# Patient Record
Sex: Female | Born: 1955 | Race: White | Hispanic: No | Smoking: Former smoker
Health system: Southern US, Community
[De-identification: ages and names within clinical notes are randomized; demographics above are authoritative.]

## PROBLEM LIST (undated history)

## (undated) DIAGNOSIS — S069X9A Unspecified intracranial injury with loss of consciousness of unspecified duration, initial encounter: Secondary | ICD-10-CM

## (undated) DIAGNOSIS — S069XAA Unspecified intracranial injury with loss of consciousness status unknown, initial encounter: Secondary | ICD-10-CM

## (undated) DIAGNOSIS — R7303 Prediabetes: Secondary | ICD-10-CM

## (undated) DIAGNOSIS — J189 Pneumonia, unspecified organism: Secondary | ICD-10-CM

## (undated) DIAGNOSIS — R011 Cardiac murmur, unspecified: Secondary | ICD-10-CM

## (undated) DIAGNOSIS — B009 Herpesviral infection, unspecified: Secondary | ICD-10-CM

## (undated) DIAGNOSIS — I10 Essential (primary) hypertension: Secondary | ICD-10-CM

## (undated) DIAGNOSIS — E039 Hypothyroidism, unspecified: Secondary | ICD-10-CM

## (undated) DIAGNOSIS — G4700 Insomnia, unspecified: Secondary | ICD-10-CM

## (undated) DIAGNOSIS — G894 Chronic pain syndrome: Secondary | ICD-10-CM

## (undated) DIAGNOSIS — M549 Dorsalgia, unspecified: Secondary | ICD-10-CM

## (undated) DIAGNOSIS — M1990 Unspecified osteoarthritis, unspecified site: Secondary | ICD-10-CM

## (undated) DIAGNOSIS — I2510 Atherosclerotic heart disease of native coronary artery without angina pectoris: Secondary | ICD-10-CM

## (undated) DIAGNOSIS — Z90710 Acquired absence of both cervix and uterus: Secondary | ICD-10-CM

## (undated) DIAGNOSIS — I341 Nonrheumatic mitral (valve) prolapse: Secondary | ICD-10-CM

## (undated) DIAGNOSIS — C9590 Leukemia, unspecified not having achieved remission: Secondary | ICD-10-CM

## (undated) DIAGNOSIS — G40909 Epilepsy, unspecified, not intractable, without status epilepticus: Secondary | ICD-10-CM

## (undated) HISTORY — PX: TONSILLECTOMY: SUR1361

## (undated) HISTORY — PX: BLADDER SURGERY: SHX569

## (undated) HISTORY — PX: GALLBLADDER SURGERY: SHX652

## (undated) HISTORY — DX: Acquired absence of both cervix and uterus: Z90.710

## (undated) HISTORY — DX: Hypothyroidism, unspecified: E03.9

## (undated) HISTORY — DX: Unspecified osteoarthritis, unspecified site: M19.90

## (undated) HISTORY — PX: TUBAL LIGATION: SHX77

## (undated) HISTORY — DX: Herpesviral infection, unspecified: B00.9

## (undated) HISTORY — DX: Prediabetes: R73.03

## (undated) HISTORY — DX: Essential (primary) hypertension: I10

## (undated) HISTORY — DX: Dorsalgia, unspecified: M54.9

## (undated) HISTORY — PX: ROTATOR CUFF REPAIR: SHX139

## (undated) HISTORY — PX: CARDIAC CATHETERIZATION: SHX172

## (undated) HISTORY — DX: Leukemia, unspecified not having achieved remission: C95.90

## (undated) HISTORY — PX: LUNG REMOVAL, PARTIAL: SHX233

## (undated) HISTORY — DX: Cardiac murmur, unspecified: R01.1

## (undated) HISTORY — PX: ABDOMINAL HYSTERECTOMY: SHX81

## (undated) HISTORY — DX: Insomnia, unspecified: G47.00

---
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
## 2002-05-11 ENCOUNTER — Encounter: Payer: Self-pay | Admitting: Emergency Medicine

## 2002-05-11 ENCOUNTER — Encounter: Payer: Self-pay | Admitting: *Deleted

## 2002-05-11 ENCOUNTER — Emergency Department (HOSPITAL_COMMUNITY): Admission: EM | Admit: 2002-05-11 | Discharge: 2002-05-11 | Payer: Self-pay | Admitting: *Deleted

## 2002-12-11 ENCOUNTER — Emergency Department (HOSPITAL_COMMUNITY): Admission: EM | Admit: 2002-12-11 | Discharge: 2002-12-11 | Payer: Self-pay | Admitting: Emergency Medicine

## 2002-12-16 ENCOUNTER — Encounter: Payer: Self-pay | Admitting: Internal Medicine

## 2002-12-16 ENCOUNTER — Encounter: Admission: RE | Admit: 2002-12-16 | Discharge: 2002-12-16 | Payer: Self-pay | Admitting: Internal Medicine

## 2002-12-22 ENCOUNTER — Emergency Department (HOSPITAL_COMMUNITY): Admission: EM | Admit: 2002-12-22 | Discharge: 2002-12-22 | Payer: Self-pay | Admitting: Emergency Medicine

## 2003-08-01 ENCOUNTER — Encounter: Admission: RE | Admit: 2003-08-01 | Discharge: 2003-08-01 | Payer: Self-pay | Admitting: Internal Medicine

## 2003-08-14 ENCOUNTER — Emergency Department (HOSPITAL_COMMUNITY): Admission: EM | Admit: 2003-08-14 | Discharge: 2003-08-14 | Payer: Self-pay | Admitting: Emergency Medicine

## 2017-11-21 ENCOUNTER — Encounter: Payer: Self-pay | Admitting: Family Medicine

## 2017-11-21 ENCOUNTER — Ambulatory Visit (INDEPENDENT_AMBULATORY_CARE_PROVIDER_SITE_OTHER): Admitting: Family Medicine

## 2017-11-21 ENCOUNTER — Other Ambulatory Visit: Payer: Self-pay

## 2017-11-21 VITALS — BP 150/84 | HR 84 | Temp 97.8°F | Ht 68.0 in | Wt 227.4 lb

## 2017-11-21 DIAGNOSIS — Z1231 Encounter for screening mammogram for malignant neoplasm of breast: Secondary | ICD-10-CM | POA: Diagnosis not present

## 2017-11-21 DIAGNOSIS — M549 Dorsalgia, unspecified: Secondary | ICD-10-CM

## 2017-11-21 DIAGNOSIS — I10 Essential (primary) hypertension: Secondary | ICD-10-CM | POA: Insufficient documentation

## 2017-11-21 DIAGNOSIS — E039 Hypothyroidism, unspecified: Secondary | ICD-10-CM | POA: Diagnosis not present

## 2017-11-21 DIAGNOSIS — Z79890 Hormone replacement therapy (postmenopausal): Secondary | ICD-10-CM | POA: Diagnosis not present

## 2017-11-21 DIAGNOSIS — G4700 Insomnia, unspecified: Secondary | ICD-10-CM | POA: Diagnosis not present

## 2017-11-21 DIAGNOSIS — M5430 Sciatica, unspecified side: Secondary | ICD-10-CM

## 2017-11-21 DIAGNOSIS — R7303 Prediabetes: Secondary | ICD-10-CM | POA: Diagnosis not present

## 2017-11-21 MED ORDER — METHOCARBAMOL 750 MG PO TABS: 750.0000 mg | ORAL_TABLET | Freq: Four times a day (QID) | ORAL | 5 refills | Status: DC | PRN

## 2017-11-21 MED ORDER — CELECOXIB 200 MG PO CAPS: 200.0000 mg | ORAL_CAPSULE | Freq: Two times a day (BID) | ORAL | 5 refills | Status: DC

## 2017-11-21 MED ORDER — HYDROCHLOROTHIAZIDE 25 MG PO TABS
25.0000 mg | ORAL_TABLET | Freq: Every day | ORAL | 1 refills | Status: DC
Start: 2017-11-21 — End: 2018-06-01

## 2017-11-21 MED ORDER — LEVOTHYROXINE SODIUM 125 MCG PO TABS: 125.0000 ug | ORAL_TABLET | Freq: Every day | ORAL | 2 refills | Status: DC

## 2017-11-21 MED ORDER — TRAZODONE HCL 100 MG PO TABS: 100.0000 mg | ORAL_TABLET | Freq: Every day | ORAL | 1 refills | Status: DC

## 2017-11-21 NOTE — Patient Instructions (Signed)
     IF you received an x-ray today, you will receive an invoice from [facility]. Please contact [facility] at [phone] with questions or concerns regarding your invoice.   IF you received labwork today, you will receive an invoice from [facility]. Please contact [facility] at [phone] with questions or concerns regarding your invoice.   Our billing staff will not be able to assist you with questions regarding bills from these companies.  You will be contacted with the lab results as soon as they are available. The fastest way to get your results is to activate your My Chart account. Instructions are located on the last page of this paperwork. If you have not heard from us regarding the results in 2 weeks, please contact this office.     

## 2017-11-21 NOTE — Progress Notes (Signed)
3/8/20192:40 PM  Natalie Eaton 1955/10/01, 62 y.o. female [id number]  Chief Complaint  Patient presents with  . Establish Care    Just dmoveh here from [place]. Looking to establish care. Known to have thyroid condition. On medication or that.    HPI:   Patient is a 62 y.o. female with past medical history significant per medical h/o who presents today to establish care  Recently moved from [place] to [place] to be closer to grandchildren Last medical appt about a year ago Reports normal colonoscopy several years ago Reports benign mammogram a year ago, had 2 small benign cysts in right breast Had hyst in her 29s, unclear reason, remembers being told she had abnormal cells, but no cancer Heart cath done in [place] maybe 5 years ago, per patient normal  Wondering if her thyroid is at goal. Gaining weight, tired In the past has done very well with weight watchers Changed last year from premarin to estrace 1mg , on HRT 20 years, she tried to wean herself off but had horrible hot flashes, we went abruptly from 1mg  to 1/2mg  daily. 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 Codeine     Prior to Admission medications   Medication Sig Start Date End Date Taking? Authorizing Provider  aspirin 81 MG chewable tablet Chew by mouth daily.   Yes [provider]  celecoxib (CELEBREX) 200 MG capsule Take 200 mg by mouth 2 (two) times daily.   Yes [provider]  estradiol (ESTRACE) 1 MG tablet Take 1 mg by mouth daily.   Yes [provider]  gabapentin (NEURONTIN) 300 MG capsule Take 300 mg by mouth 3 (three) times daily.   Yes [provider]  levothyroxine (SYNTHROID, LEVOTHROID) 125  MCG tablet Take 125 mcg by mouth daily before breakfast.   Yes [provider]  methocarbamol (ROBAXIN) 750 MG tablet Take 750 mg by mouth 4 (four) times daily.   Yes [provider]  multivitamin-iron-minerals-folic acid (CENTRUM) chewable tablet Chew 1 tablet by mouth daily.   Yes [provider]  naproxen sodium (ALEVE) 220 MG tablet Take 220 mg by mouth.   Yes [provider]  traZODone (DESYREL) 100 MG tablet Take 100 mg by mouth at bedtime.   Yes [provider]    Past Medical History:  Diagnosis Date  . Back pain   . History of hysterectomy   . Hypertension   . Hypothyroidism   . Insomnia   . Pre-diabetes     Past Surgical History:  Procedure Laterality Date  . ABDOMINAL HYSTERECTOMY    . GALLBLADDER SURGERY    . LUNG REMOVAL, PARTIAL      Social History   Tobacco Use  . Smoking status: Former Research scientist (life sciences)  . Smokeless tobacco: Never Used  Substance Use Topics  . Alcohol use: Yes    Alcohol/week: 0.6 oz    Types: 1 Glasses of wine per week    Frequency: Never    Family History  Problem Relation Age of Onset  . Healthy Mother   . Heart disease Brother   . Healthy Daughter   . Healthy Son     Review of Systems  Constitutional: Negative  for chills and fever.  HENT: Negative for congestion, ear pain and sore throat.   Eyes: Negative for blurred vision and double vision.  Respiratory: Negative for cough and shortness of breath.   Cardiovascular: Negative for chest pain, palpitations and leg swelling.  Gastrointestinal: Negative for abdominal pain, constipation, diarrhea, nausea and vomiting.  Genitourinary: Negative for dysuria and hematuria.       Neg breast lumps or nipple discharge Neg vaginal discharge, pelvic pain  Musculoskeletal: Positive for back pain.  Neurological: Positive for tingling. Negative for dizziness, focal weakness and headaches.  All other systems reviewed and are  negative.    OBJECTIVE:  Blood pressure (!) 150/84, pulse 84, temperature 97.8 F (36.6 C), temperature source Oral, height 5\' 8"  (1.727 m), weight 227 lb 6.4 oz (103.1 kg), SpO2 99 %.  Physical Exam  Constitutional: She is oriented to person, place, and time and well-developed, well-nourished, and in no distress.  HENT:  Head: Normocephalic and atraumatic.  Right Ear: Hearing, tympanic membrane, external ear and ear canal normal.  Left Ear: Hearing, tympanic membrane, external ear and ear canal normal.  Mouth/Throat: Oropharynx is clear and moist.  Eyes: EOM are normal. Pupils are equal, round, and reactive to light.  Neck: Neck supple. No thyromegaly present.  Cardiovascular: Normal rate, regular rhythm, normal heart sounds and intact distal pulses. Exam reveals no gallop and no friction rub.  No murmur heard. Pulmonary/Chest: Effort normal and breath sounds normal. She has no wheezes. She has no rales.  Abdominal: Soft. Bowel sounds are normal. She exhibits no distension and no mass. There is no tenderness.  Musculoskeletal: Normal range of motion. She exhibits no edema.  Lymphadenopathy:    She has no cervical adenopathy.  Neurological: She is alert and oriented to person, place, and time. She has normal reflexes. Gait normal.  Skin: Skin is warm and dry.  Psychiatric: Mood and affect normal.  Nursing note and vitals reviewed.   ASSESSMENT and PLAN  1. Essential hypertension, benign Above goal, but patient wo medications for about a week,  Refilling HCTZ, she takes KCL supplements. Discussed DASH diet, Recheck at next visit. - CBC - Comprehensive metabolic panel - Lipid panel  2. Hypothyroidism, unspecified type Patient seems to be symptomatic, checking level today, will adjust meds if needed - TSH  3. Back pain with sciatica Stable on current regime  4. Prediabetes Patient with recent weight gain, checking a1c today. Discussed low carb diet and regular exercise.  consider rejoining weight watchers. - Hemoglobin A1c - Lipid panel  5. Insomnia, unspecified type Controlled, cont trazdodone  6. Hormone replacement therapy (HRT) Discussed r/se/b. Discussed very slow wean strategy.   7. Visit for screening mammogram - MM DIGITAL SCREENING BILATERAL; Future  Other orders - multivitamin-iron-minerals-folic acid (CENTRUM) chewable tablet; Chew 1 tablet by mouth daily. - naproxen sodium (ALEVE) 220 MG tablet; Take 220 mg by mouth. - aspirin 81 MG chewable tablet; Chew by mouth daily. - estradiol (ESTRACE) 1 MG tablet; Take 1 mg by mouth daily. - gabapentin (NEURONTIN) 300 MG capsule; Take 300 mg by mouth 3 (three) times daily. - potassium chloride SA (K-DUR,KLOR-CON) 20 MEQ tablet; Take 20 mEq by mouth daily. - hydrochlorothiazide (HYDRODIURIL) 25 MG tablet; Take 1 tablet (25 mg total) by mouth daily. - levothyroxine (SYNTHROID, LEVOTHROID) 125 MCG tablet; Take 1 tablet (125 mcg total) by mouth daily before breakfast. - methocarbamol (ROBAXIN) 750 MG tablet; Take 1 tablet (750 mg total) by mouth every 6 (six) hours as needed  for muscle spasms. - traZODone (DESYREL) 100 MG tablet; Take 1 tablet (100 mg total) by mouth at bedtime. - celecoxib (CELEBREX) 200 MG capsule; Take 1 capsule (200 mg total) by mouth 2 (two) times daily.  Return in about 4 weeks (around 12/19/2017).    Rutherford Guys, MD Primary Care at [facility] [place], [place] [address] Ph.  [phone] Fax [phone]

## 2017-11-22 ENCOUNTER — Encounter: Payer: Self-pay | Admitting: Family Medicine

## 2017-11-22 LAB — COMPREHENSIVE METABOLIC PANEL
ALT: 25 IU/L (ref 0–32)
AST: 19 IU/L (ref 0–40)
Albumin/Globulin Ratio: 1.8 (ref 1.2–2.2)
Albumin: 4.3 g/dL (ref 3.6–4.8)
Alkaline Phosphatase: 68 IU/L (ref 39–117)
BUN/Creatinine Ratio: 29 — ABNORMAL HIGH (ref 12–28)
BUN: 22 mg/dL (ref 8–27)
Bilirubin Total: <0.2 mg/dL (ref 0.0–1.2)
CO2: 25 mmol/L (ref 20–29)
Calcium: 9.6 mg/dL (ref 8.7–10.3)
Chloride: 103 mmol/L (ref 96–106)
Creatinine, Ser: 0.77 mg/dL (ref 0.57–1.00)
GFR calc Af Amer: 96 mL/min/{1.73_m2} (ref >59)
GFR calc non Af Amer: 83 mL/min/{1.73_m2} (ref >59)
Globulin, Total: 2.4 g/dL (ref 1.5–4.5)
Glucose: 95 mg/dL (ref 65–99)
Potassium: 4.4 mmol/L (ref 3.5–5.2)
Sodium: 142 mmol/L (ref 134–144)
Total Protein: 6.7 g/dL (ref 6.0–8.5)

## 2017-11-22 LAB — CBC
Hematocrit: 40.9 % (ref 34.0–46.6)
Hemoglobin: 13.2 g/dL (ref 11.1–15.9)
MCH: 31 pg (ref 26.6–33.0)
MCHC: 32.3 g/dL (ref 31.5–35.7)
MCV: 96 fL (ref 79–97)
Platelets: 298 10*3/uL (ref 150–379)
RBC: 4.26 x10E6/uL (ref 3.77–5.28)
RDW: 13.9 % (ref 12.3–15.4)
WBC: 11.5 10*3/uL — ABNORMAL HIGH (ref 3.4–10.8)

## 2017-11-22 LAB — LIPID PANEL
Chol/HDL Ratio: 4.6 ratio — ABNORMAL HIGH (ref 0.0–4.4)
Cholesterol, Total: 210 mg/dL — ABNORMAL HIGH (ref 100–199)
HDL: 46 mg/dL (ref >39)
LDL Calculated: 121 mg/dL — ABNORMAL HIGH (ref 0–99)
Triglycerides: 215 mg/dL — ABNORMAL HIGH (ref 0–149)
VLDL Cholesterol Cal: 43 mg/dL — ABNORMAL HIGH (ref 5–40)

## 2017-11-22 LAB — HEMOGLOBIN A1C
Est. average glucose Bld gHb Est-mCnc: 114 mg/dL
Hgb A1c MFr Bld: 5.6 % (ref 4.8–5.6)

## 2017-11-22 LAB — TSH: TSH: 0.662 u[IU]/mL (ref 0.450–4.500)

## 2017-11-25 ENCOUNTER — Other Ambulatory Visit: Payer: Self-pay

## 2017-11-25 ENCOUNTER — Encounter (HOSPITAL_COMMUNITY): Payer: Self-pay | Admitting: Emergency Medicine

## 2017-11-25 ENCOUNTER — Emergency Department (HOSPITAL_COMMUNITY)
Admission: EM | Admit: 2017-11-25 | Discharge: 2017-11-26 | Disposition: A | Discharge status: Home / Self Care | Payer: Self-pay | Attending: Emergency Medicine | Admitting: Emergency Medicine

## 2017-11-25 DIAGNOSIS — Z5321 Procedure and treatment not carried out due to patient leaving prior to being seen by health care provider: Secondary | ICD-10-CM | POA: Insufficient documentation

## 2017-11-25 DIAGNOSIS — G501 Atypical facial pain: Secondary | ICD-10-CM | POA: Insufficient documentation

## 2017-11-25 NOTE — ED Triage Notes (Signed)
Pt reports that she was working w/ numerous garden chemicals and thinks she might have rubbed her face as it began to burn.  Pt reports she washed her face but it continues to burn.

## 2017-11-26 NOTE — ED Notes (Signed)
11/26/2017, Attempted follow-up call, no answer.

## 2017-12-19 ENCOUNTER — Encounter: Payer: Self-pay | Admitting: Family Medicine

## 2017-12-19 ENCOUNTER — Ambulatory Visit (INDEPENDENT_AMBULATORY_CARE_PROVIDER_SITE_OTHER): Admitting: Family Medicine

## 2017-12-19 ENCOUNTER — Other Ambulatory Visit: Payer: Self-pay

## 2017-12-19 ENCOUNTER — Telehealth: Payer: Self-pay

## 2017-12-19 VITALS — BP 128/62 | HR 83 | Temp 98.1°F | Ht 68.0 in | Wt 217.8 lb

## 2017-12-19 DIAGNOSIS — I10 Essential (primary) hypertension: Secondary | ICD-10-CM

## 2017-12-19 DIAGNOSIS — M549 Dorsalgia, unspecified: Secondary | ICD-10-CM | POA: Diagnosis not present

## 2017-12-19 DIAGNOSIS — E039 Hypothyroidism, unspecified: Secondary | ICD-10-CM | POA: Diagnosis not present

## 2017-12-19 DIAGNOSIS — M5430 Sciatica, unspecified side: Secondary | ICD-10-CM | POA: Diagnosis not present

## 2017-12-19 DIAGNOSIS — Z6833 Body mass index (BMI) 33.0-33.9, adult: Secondary | ICD-10-CM

## 2017-12-19 NOTE — Progress Notes (Signed)
4/5/201910:44 AM  Natalie Eaton [birth year], 62 y.o. female [id number]  Chief Complaint  Patient presents with  . Follow-up    Hypertension and thyroid  . Pain    has pinched nerve in her back, dx in 2012    HPI:   Patient is a 62 y.o. female with past medical history significant for hypothyroidism, HTN, obesity, DDD of spine who presents today for followup on chronic conditions  1. Last TSH at low end of normal, patient continues to struggle with weight loss, started a low carb diet last month, limiting to 60 grams of carb a day, trying to remain active, but this becoming difficult with worsening back pain  2. HTN - takes her BP meds as prescribed, denies any side effects  3. DDD of spine - last evaluation surgery was recommended. She reports low back pain, with radiation down her right hip into her groin and sometimes all the way down to her foot. At times she needs to use a cane. She finds it most comfortable when she is able to use a shopping cart. Pain is constant, today 7/10. She reports stiffness towards the end of the day. In the past she had declined surgery at she lived alone and far from her family but now is considering it as she has more family support. She also is interested in spinal stimulator. Her mother has done well with one.   4. She has also not heard regarding her mammogram appt  Depression screen [facility] 2/9 12/19/2017 11/21/2017  Decreased Interest 0 0  Down, Depressed, Hopeless 0 0  PHQ - 2 Score 0 0    Allergies  Allergen Reactions  . Codeine     Prior to Admission medications   Medication Sig Start Date End Date Taking? Authorizing Provider  aspirin 81 MG chewable tablet Chew by mouth daily.   Yes [provider]  celecoxib (CELEBREX) 200 MG capsule Take 1 capsule (200 mg total) by mouth 2 (two) times daily. 11/21/17  Yes Rutherford Guys, MD  estradiol (ESTRACE) 1 MG tablet Take 1 mg by mouth daily.   Yes [provider]  gabapentin  (NEURONTIN) 300 MG capsule Take 300 mg by mouth 3 (three) times daily.   Yes [provider]  hydrochlorothiazide (HYDRODIURIL) 25 MG tablet Take 1 tablet (25 mg total) by mouth daily. 11/21/17  Yes Rutherford Guys, MD  levothyroxine (SYNTHROID, LEVOTHROID) 125 MCG tablet Take 1 tablet (125 mcg total) by mouth daily before breakfast. 11/21/17  Yes Rutherford Guys, MD  methocarbamol (ROBAXIN) 750 MG tablet Take 1 tablet (750 mg total) by mouth every 6 (six) hours as needed for muscle spasms. 11/21/17  Yes Rutherford Guys, MD  multivitamin-iron-minerals-folic acid (CENTRUM) chewable tablet Chew 1 tablet by mouth daily.   Yes [provider]  potassium chloride SA (K-DUR,KLOR-CON) 20 MEQ tablet Take 20 mEq by mouth daily.   Yes [provider]  traZODone (DESYREL) 100 MG tablet Take 1 tablet (100 mg total) by mouth at bedtime. 11/21/17  Yes Rutherford Guys, MD    Past Medical History:  Diagnosis Date  . Back pain   . History of hysterectomy   . Hypertension   . Hypothyroidism   . Insomnia   . Pre-diabetes     Past Surgical History:  Procedure Laterality Date  . ABDOMINAL HYSTERECTOMY    . GALLBLADDER SURGERY    . LUNG REMOVAL, PARTIAL      Social History  Tobacco Use  . Smoking status: Former Research scientist (life sciences)  . Smokeless tobacco: Never Used  Substance Use Topics  . Alcohol use: Yes    Alcohol/week: 0.6 oz    Types: 1 Glasses of wine per week    Frequency: Never    Family History  Problem Relation Age of Onset  . Healthy Mother   . Heart disease Brother   . Healthy Daughter   . Healthy Son     Review of Systems  Constitutional: Negative for chills and fever.  Respiratory: Negative for cough and shortness of breath.   Cardiovascular: Negative for chest pain, palpitations and leg swelling.  Gastrointestinal: Negative for abdominal pain, nausea and vomiting.     OBJECTIVE:  Blood pressure 128/62, pulse 83, temperature 98.1 F (36.7 C), temperature  source Oral, height 5\' 8"  (1.727 m), weight 217 lb 12.8 oz (98.8 kg), SpO2 96 %.  Wt Readings from Last 3 Encounters:  12/19/17 217 lb 12.8 oz (98.8 kg)  11/25/17 227 lb (103 kg)  11/21/17 227 lb 6.4 oz (103.1 kg)    Physical Exam  Constitutional: She is oriented to person, place, and time and well-developed, well-nourished, and in no distress.  HENT:  Head: Normocephalic and atraumatic.  Mouth/Throat: Mucous membranes are normal.  Eyes: Pupils are equal, round, and reactive to light. EOM are normal. No scleral icterus.  Neck: Neck supple.  Pulmonary/Chest: Effort normal.  Neurological: She is alert and oriented to person, place, and time. Gait normal.  Skin: Skin is warm and dry.  Psychiatric: Mood and affect normal.  Nursing note and vitals reviewed.   ASSESSMENT and PLAN  1. Essential hypertension, benign At goal, continue current regime  2. Hypothyroidism, unspecified type At goal, continue current regime  3. Back pain with sciatica - Ambulatory referral to Spine Surgery  4. BMI 33.0-33.9,adult Congratulated patient on current weight loss, discussed block diets, 1600 calories a day, about 1 pound a week, as she is finding low carb too restrictive. Patient educational handout given.   Provided patient with information to call [facility] to schedule mammogram.  Return for after spine surgeon.    Rutherford Guys, MD Primary Care at [place], [place] [address] Ph.  [phone] Fax [phone]

## 2017-12-19 NOTE — Telephone Encounter (Signed)
Copied from [place]. Topic: Referral - Request >> Dec 19, 2017 11:32 AM Rutherford Nail, NT wrote: Reason for CRM:   Patient calling stated she was seen this morning 4.5.19, and was told that a referral would be put in to a back specialist. Patient states that the back specialist that she is referred to need to be in the [facility]. Please advise.

## 2017-12-19 NOTE — Patient Instructions (Addendum)
[facility], [place], [place] [address] [phone]    IF you received an x-ray today, you will receive an invoice from [facility]. Please contact [facility] at [phone] with questions or concerns regarding your invoice.   IF you received labwork today, you will receive an invoice from [facility]. Please contact [facility] at [phone] with questions or concerns regarding your invoice.   Our billing staff will not be able to assist you with questions regarding bills from these companies.  You will be contacted with the lab results as soon as they are available. The fastest way to get your results is to activate your My Chart account. Instructions are located on the last page of this paperwork. If you have not heard from [place] regarding the results in 2 weeks, please contact this office.

## 2017-12-29 ENCOUNTER — Telehealth: Payer: Self-pay | Admitting: Family Medicine

## 2017-12-29 NOTE — Telephone Encounter (Signed)
Copied from [place] [id number]. Topic: Referral - Status >> Dec 29, 2017  2:10 PM Cleaster Corin, [place] wrote: Reason for CRM:  pt. Calling to check on referral status for mammogram that needs to be done at the  [facility] of [facility]   Pt. Stating that paper work of past imaging hasn't been sent yet.

## 2017-12-30 ENCOUNTER — Other Ambulatory Visit: Payer: Self-pay

## 2017-12-30 DIAGNOSIS — Z1231 Encounter for screening mammogram for malignant neoplasm of breast: Secondary | ICD-10-CM

## 2017-12-30 NOTE — Telephone Encounter (Signed)
Please advise 

## 2017-12-30 NOTE — Telephone Encounter (Signed)
error 

## 2017-12-30 NOTE — Telephone Encounter (Signed)
Spoke with pt and she stated [facility] cannot schedule her mammogram until they receive records from Dr. Edwinna Areola at [facility]. Pt stated she had contacted them to have these records sent but was unsure if they were coming to [place] or going to [place]. I spoke with medical records and we have the records. They are making a copy and placing them in the 102 box for pt to pick up. Pt aware and stated she will pick these up tomorrow am to take to [place].

## 2017-12-31 ENCOUNTER — Telehealth: Payer: Self-pay | Admitting: Family Medicine

## 2017-12-31 NOTE — Telephone Encounter (Signed)
Request for refill of Estradiol(Estrace) 1mg  tab. Previously prescribed by historical provider.   LOV: 11/21/17  [facility]   [address]

## 2017-12-31 NOTE — Telephone Encounter (Signed)
Please advise 

## 2017-12-31 NOTE — Telephone Encounter (Signed)
Copied from [place] [id number]. Topic: Referral - Request >> Dec 30, 2017 11:29 AM Hewitt Shorts wrote: Reason for CRM: pt is requesting to change the referral from [facility] (per patient) and she would like would like to change to dr Bettye Boeck number [phone]  -----------------------------------------------------------------------------------------  Please advise. Pt is requesting to see Dr. Nelva Bush at [facility] instead of Neurosurgery. Is this okay? If so, we will need referral in for Orthoapedics. Thanks!

## 2017-12-31 NOTE — Telephone Encounter (Signed)
Copied from [place]. Topic: Quick Communication - Rx Refill/Question >> Dec 31, 2017 11:13 AM Boyd Kerbs wrote:  Medication:estradiol (ESTRACE) 1 MG tablet   Has the patient contacted their pharmacy? No    (Agent: If no, request that the patient contact the pharmacy for the refill.) Preferred Pharmacy (with phone number or street name):   [place], [place] - [address] [place] [address] [id number] [address] [place] [address] Phone: [phone] Fax: [phone]   Agent: Please be advised that RX refills may take up to 3 business days. We ask that you follow-up with your pharmacy.

## 2018-01-02 ENCOUNTER — Other Ambulatory Visit: Payer: Self-pay | Admitting: Family Medicine

## 2018-01-02 DIAGNOSIS — M549 Dorsalgia, unspecified: Secondary | ICD-10-CM

## 2018-01-02 DIAGNOSIS — M5430 Sciatica, unspecified side: Secondary | ICD-10-CM

## 2018-01-02 MED ORDER — ESTRADIOL 1 MG PO TABS: 1.0000 mg | ORAL_TABLET | Freq: Every day | ORAL | 0 refills | Status: DC

## 2018-01-02 NOTE — Telephone Encounter (Signed)
done

## 2018-01-02 NOTE — Telephone Encounter (Signed)
Referral sent. Thanks.

## 2018-01-27 ENCOUNTER — Ambulatory Visit
Admission: RE | Admit: 2018-01-27 | Discharge: 2018-01-27 | Disposition: A | Discharge status: Home / Self Care | Payer: PRIVATE HEALTH INSURANCE | Source: Ambulatory Visit | Attending: Family Medicine | Admitting: Family Medicine

## 2018-01-27 ENCOUNTER — Encounter: Payer: Self-pay | Admitting: Radiology

## 2018-01-27 DIAGNOSIS — Z1231 Encounter for screening mammogram for malignant neoplasm of breast: Secondary | ICD-10-CM

## 2018-02-24 ENCOUNTER — Ambulatory Visit (INDEPENDENT_AMBULATORY_CARE_PROVIDER_SITE_OTHER): Admitting: Family Medicine

## 2018-02-24 ENCOUNTER — Encounter: Payer: Self-pay | Admitting: Family Medicine

## 2018-02-24 ENCOUNTER — Other Ambulatory Visit: Payer: Self-pay

## 2018-02-24 VITALS — BP 130/84 | HR 77 | Temp 99.2°F | Resp 16 | Ht 68.0 in | Wt 215.4 lb

## 2018-02-24 DIAGNOSIS — Z6832 Body mass index (BMI) 32.0-32.9, adult: Secondary | ICD-10-CM

## 2018-02-24 DIAGNOSIS — Z6830 Body mass index (BMI) 30.0-30.9, adult: Secondary | ICD-10-CM | POA: Insufficient documentation

## 2018-02-24 DIAGNOSIS — Z1211 Encounter for screening for malignant neoplasm of colon: Secondary | ICD-10-CM | POA: Diagnosis not present

## 2018-02-24 DIAGNOSIS — R5383 Other fatigue: Secondary | ICD-10-CM

## 2018-02-24 DIAGNOSIS — I10 Essential (primary) hypertension: Secondary | ICD-10-CM

## 2018-02-24 DIAGNOSIS — E039 Hypothyroidism, unspecified: Secondary | ICD-10-CM | POA: Diagnosis not present

## 2018-02-24 DIAGNOSIS — E669 Obesity, unspecified: Secondary | ICD-10-CM | POA: Insufficient documentation

## 2018-02-24 NOTE — Progress Notes (Signed)
6/11/201910:44 AM  Natalie Eaton August 01, 1956, 62 y.o. female [id number]  Chief Complaint  Patient presents with  . essential hypertension    8 week f/u, still experiencing fatigue    HPI:   Patient is a 62 y.o. female with past medical history significant for HTN, hypothyroidism, back pain with sciatica and obesity who presents today for follow-up  TSH at goal Tolerating BP meds as prescribed Has decided to postpone spine surgeon eval, waiting until she has medicare She continues to struggle with days of feeling drained, too tired to do anything She recognizes that her poor sleep and weight might have something to do with this She also mentions that she used to take a ginseng supplement and has been without for a while and thinks that this is also contributing to current occasional fatigue She reports sleeping with 3 days and that being the main reason for poor sleep She will take 1/2 trazodone when she is having increase worrying and needing to quite the brain down She joined weight watchers a week ago, happy about that decision She remains active, just past week she was cutting tree limbs down with an electric chainsaw and digging a trench along her front yard and lying down 50lbs of gravel She denies snoring, waking up with dry mouth, chest pain, DOE, diaphoresis, palpitations, orthopnea or PND    Fall Risk  02/24/2018 12/19/2017 11/21/2017  Falls in the past year? No No No     Depression screen [facility] 2/9 02/24/2018 12/19/2017 11/21/2017  Decreased Interest 0 0 0  Down, Depressed, Hopeless 0 0 0  PHQ - 2 Score 0 0 0    Allergies  Allergen Reactions  . Codeine     Prior to Admission medications   Medication Sig Start Date End Date Taking? Authorizing Provider  aspirin 81 MG chewable tablet Chew by mouth daily.   Yes [provider]  celecoxib (CELEBREX) 200 MG capsule Take 1 capsule (200 mg total) by mouth 2 (two) times daily. 11/21/17  Yes Rutherford Guys, MD    estradiol (ESTRACE) 1 MG tablet Take 1 tablet (1 mg total) by mouth daily. 01/02/18  Yes Rutherford Guys, MD  gabapentin (NEURONTIN) 300 MG capsule Take 300 mg by mouth 3 (three) times daily.   Yes [provider]  hydrochlorothiazide (HYDRODIURIL) 25 MG tablet Take 1 tablet (25 mg total) by mouth daily. 11/21/17  Yes Rutherford Guys, MD  levothyroxine (SYNTHROID, LEVOTHROID) 125 MCG tablet Take 1 tablet (125 mcg total) by mouth daily before breakfast. 11/21/17  Yes Rutherford Guys, MD  methocarbamol (ROBAXIN) 750 MG tablet Take 1 tablet (750 mg total) by mouth every 6 (six) hours as needed for muscle spasms. 11/21/17  Yes Rutherford Guys, MD  multivitamin-iron-minerals-folic acid (CENTRUM) chewable tablet Chew 1 tablet by mouth daily.   Yes [provider]  potassium chloride SA (K-DUR,KLOR-CON) 20 MEQ tablet Take 20 mEq by mouth daily.   Yes [provider]  traZODone (DESYREL) 100 MG tablet Take 1 tablet (100 mg total) by mouth at bedtime. 11/21/17  Yes Rutherford Guys, MD    Past Medical History:  Diagnosis Date  . Back pain   . History of hysterectomy   . Hypertension   . Hypothyroidism   . Insomnia   . Pre-diabetes     Past Surgical History:  Procedure Laterality Date  . ABDOMINAL HYSTERECTOMY    . GALLBLADDER SURGERY    . LUNG REMOVAL, PARTIAL  Social History   Tobacco Use  . Smoking status: Former Research scientist (life sciences)  . Smokeless tobacco: Never Used  Substance Use Topics  . Alcohol use: Yes    Alcohol/week: 0.6 oz    Types: 1 Glasses of wine per week    Frequency: Never    Family History  Problem Relation Age of Onset  . Healthy Mother   . Heart disease Brother   . Healthy Daughter   . Healthy Son     ROS Per hpi  OBJECTIVE:  Blood pressure 130/84, pulse 77, temperature 99.2 F (37.3 C), temperature source Oral, resp. rate 16, height 5\' 8"  (1.727 m), weight 215 lb 6.4 oz (97.7 kg), SpO2 97 %.  Wt Readings from Last 3 Encounters:   02/24/18 215 lb 6.4 oz (97.7 kg)  12/19/17 217 lb 12.8 oz (98.8 kg)  11/25/17 227 lb (103 kg)    Physical Exam  Constitutional: She is oriented to person, place, and time. She appears well-developed and well-nourished.  HENT:  Head: Normocephalic and atraumatic.  Mouth/Throat: Oropharynx is clear and moist. No oropharyngeal exudate.  Eyes: Pupils are equal, round, and reactive to light. EOM are normal. No scleral icterus.  Neck: Neck supple.  Cardiovascular: Normal rate, regular rhythm and normal heart sounds. Exam reveals no gallop and no friction rub.  No murmur heard. Pulmonary/Chest: Effort normal and breath sounds normal. She has no wheezes. She has no rales.  Musculoskeletal: She exhibits no edema.  Neurological: She is alert and oriented to person, place, and time.  Skin: Skin is warm and dry.  Psychiatric: She has a normal mood and affect.  Nursing note and vitals reviewed.    ASSESSMENT and PLAN  1. Essential hypertension, benign At goal, cont current regime  2. Hypothyroidism, unspecified type At goal, cont current dose  3. Fatigue, unspecified type Labs unremarkable, patient with no cardiac sx and able to perform strenuous activities without issues. Reports normal cath 5 years ago. Discussed improving sleep, continue with weight loss. Will continue to monitor. RTC precautions reviewed  4. BMI 32.0-32.9,adult Continues to slowly loss weight. Cont weight watchers.  5. Screening for colon cancer - Ambulatory referral to Gastroenterology  Return in about 6 months (around 08/26/2018).    Rutherford Guys, MD Primary Care at [facility] [place], [place] [address] Ph.  [phone] Fax [phone]

## 2018-02-24 NOTE — Patient Instructions (Signed)
     IF you received an x-ray today, you will receive an invoice from [facility]. Please contact  Radiology at [phone] with questions or concerns regarding your invoice.   IF you received labwork today, you will receive an invoice from [facility]. Please contact [facility] at [phone] with questions or concerns regarding your invoice.   Our billing staff will not be able to assist you with questions regarding bills from these companies.  You will be contacted with the lab results as soon as they are available. The fastest way to get your results is to activate your My Chart account. Instructions are located on the last page of this paperwork. If you have not heard from us regarding the results in 2 weeks, please contact this office.     

## 2018-02-25 ENCOUNTER — Encounter: Payer: Self-pay | Admitting: Family Medicine

## 2018-03-11 ENCOUNTER — Other Ambulatory Visit: Payer: Self-pay | Admitting: Family Medicine

## 2018-03-23 ENCOUNTER — Telehealth: Payer: Self-pay

## 2018-03-23 DIAGNOSIS — B009 Herpesviral infection, unspecified: Secondary | ICD-10-CM

## 2018-03-23 HISTORY — DX: Herpesviral infection, unspecified: B00.9

## 2018-03-23 MED ORDER — ESTRADIOL 1 MG PO TABS: ORAL_TABLET | ORAL | 0 refills | Status: DC

## 2018-03-23 NOTE — Telephone Encounter (Signed)
Spoke with patient. Has not tried weaning. Discussed starting efforts again. New rx with weaning instructions given.

## 2018-03-23 NOTE — Telephone Encounter (Signed)
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

## 2018-04-08 ENCOUNTER — Ambulatory Visit (INDEPENDENT_AMBULATORY_CARE_PROVIDER_SITE_OTHER): Admitting: Physician Assistant

## 2018-04-08 ENCOUNTER — Encounter: Payer: Self-pay | Admitting: Physician Assistant

## 2018-04-08 ENCOUNTER — Other Ambulatory Visit: Payer: Self-pay

## 2018-04-08 VITALS — BP 111/73 | HR 78 | Temp 99.0°F | Resp 20 | Ht 68.11 in | Wt 209.0 lb

## 2018-04-08 DIAGNOSIS — M545 Low back pain, unspecified: Secondary | ICD-10-CM

## 2018-04-08 LAB — POCT URINALYSIS DIP (MANUAL ENTRY)
Bilirubin, UA: NEGATIVE
Blood, UA: NEGATIVE
Glucose, UA: NEGATIVE mg/dL
Ketones, POC UA: NEGATIVE mg/dL
Leukocytes, UA: NEGATIVE
Nitrite, UA: NEGATIVE
Protein Ur, POC: NEGATIVE mg/dL
Spec Grav, UA: 1.015 (ref 1.010–1.025)
Urobilinogen, UA: 0.2 E.U./dL
pH, UA: 5.5 (ref 5.0–8.0)

## 2018-04-08 NOTE — Patient Instructions (Addendum)
I recommend resting today. However, tomorrow I would begin walking and moving around as much as tolerated. Begin stretching in a couple of days. The worse thing you can do for low back pain is lie in bed all day or sit down all day. Use medications as needed.   Just to know, robaxin can cause side effects that may impair your thinking or reactions. Be careful if you drive or do anything that requires you to be awake and alert.   You should avoid heavy lifting or strenuous repetitive activity to prevent recurrence of event. Experiment with both ice and heat and choose whichever feels best for you.  Use heat pad to affected area at least 4-5 x a day for 20 minutes.   Please perform exercises below. Stretches are to be performed for 2 sets, holding 10-15 seconds each. Recommended to perform this rehab twice daily within pain tolerance for 2 weeks.  Return to clinic if symptoms worsen, do not improve, or as needed    FLEXION RANGE OF MOTION AND STRETCHING EXERCISES: STRETCH - Flexion, Single Knee to Chest   Lie on a firm bed or floor with both legs extended in front of you.  Keeping one leg in contact with the floor, bring your opposite knee to your chest. Hold your leg in place by either grabbing behind your thigh or at your knee.  Pull until you feel a gentle stretch in your lower back.   Slowly release your grasp and repeat the exercise with the opposite side.  STRETCH - Flexion, Double Knee to Chest   Lie on a firm bed or floor with both legs extended in front of you.  Keeping one leg in contact with the floor, bring your opposite knee to your chest.  Tense your stomach muscles to support your back and then lift your other knee to your chest. Hold your legs in place by either grabbing behind your thighs or at your knees.  Pull both knees toward your chest until you feel a gentle stretch in your lower back.   Tense your stomach muscles and slowly return one leg at a time to the  floor.  STRETCH - Low Trunk Rotation  Lie on a firm bed or floor. Keeping your legs in front of you, bend your knees so they are both pointed toward the ceiling and your feet are flat on the floor.  Extend your arms out to the side. This will stabilize your upper body by keeping your shoulders in contact with the floor.  Gently and slowly drop both knees together to one side until you feel a gentle stretch in your lower back.   Tense your stomach muscles to support your lower back as you bring your knees back to the starting position. Repeat the exercise to the other side.   EXTENSION RANGE OF MOTION AND FLEXIBILITY EXERCISES: STRETCH - Extension, Prone on Elbows   Lie on your stomach on the floor, a bed will be too soft. Place your palms about shoulder width apart and at the height of your head.  Place your elbows under your shoulders. If this is too painful, stack pillows under your chest.  Allow your body to relax so that your hips drop lower and make contact more completely with the floor.  Slowly return to lying flat on the floor.  RANGE OF MOTION - Extension, Prone Press Ups  Lie on your stomach on the floor, a bed will be too soft. Place your palms about  shoulder width apart and at the height of your head.  Keeping your back as relaxed as possible, slowly straighten your elbows while keeping your hips on the floor. You may adjust the placement of your hands to maximize your comfort. As you gain motion, your hands will come more underneath your shoulders.  Slowly return to lying flat on the floor.  RANGE OF MOTION- Quadruped, Neutral Spine   Assume a hands and knees position on a firm surface. Keep your hands under your shoulders and your knees under your hips. You may place padding under your knees for comfort.  Drop your head and point your tail bone toward the ground below you. This will round out your lower back like an angry cat.    Slowly lift your head and release  your tail bone so that your back sags into a large arch, like an old horse.  Repeat this until you feel limber in your lower back.  Now, find your "sweet spot." This will be the most comfortable position somewhere between the two previous positions. This is your neutral spine. Once you have found this position, tense your stomach muscles to support your lower back.  STRENGTHENING EXERCISES - Low Back Strain These exercises may help you when beginning to rehabilitate your injury. These exercises should be done near your "sweet spot." This is the neutral, low-back arch, somewhere between fully rounded and fully arched, that is your least painful position. When performed in this safe range of motion, these exercises can be used for people who have either a flexion or extension based injury. These exercises may resolve your symptoms with or without further involvement from your physician, physical therapist or athletic trainer. While completing these exercises, remember:   Muscles can gain both the endurance and the strength needed for everyday activities through controlled exercises.  Complete these exercises as instructed by your physician, physical therapist or athletic trainer. Increase the resistance and repetitions only as guided.  You may experience muscle soreness or fatigue, but the pain or discomfort you are trying to eliminate should never worsen during these exercises. If this pain does worsen, stop and make certain you are following the directions exactly. If the pain is still present after adjustments, discontinue the exercise until you can discuss the trouble with your caregiver.  STRENGTHENING - Deep Abdominals, Pelvic Tilt  Lie on a firm bed or floor. Keeping your legs in front of you, bend your knees so they are both pointed toward the ceiling and your feet are flat on the floor.  Tense your lower abdominal muscles to press your lower back into the floor. This motion will rotate your  pelvis so that your tail bone is scooping upwards rather than pointing at your feet or into the floor.  STRENGTHENING - Abdominals, Crunches   Lie on a firm bed or floor. Keeping your legs in front of you, bend your knees so they are both pointed toward the ceiling and your feet are flat on the floor. Cross your arms over your chest.  Slightly tip your chin down without bending your neck.  Tense your abdominals and slowly lift your trunk high enough to just clear your shoulder blades. Lifting higher can put excessive stress on the lower back and does not further strengthen your abdominal muscles.  Control your return to the starting position.  STRENGTHENING - Quadruped, Opposite UE/LE Lift   Assume a hands and knees position on a firm surface. Keep your hands under your shoulders  and your knees under your hips. You may place padding under your knees for comfort.  Find your neutral spine and gently tense your abdominal muscles so that you can maintain this position. Your shoulders and hips should form a rectangle that is parallel with the floor and is not twisted.  Keeping your trunk steady, lift your right hand no higher than your shoulder and then your left leg no higher than your hip. Make sure you are not holding your breath.   Continuing to keep your abdominal muscles tense and your back steady, slowly return to your starting position. Repeat with the opposite arm and leg.  STRENGTHENING - Lower Abdominals, Double Knee Lift  Lie on a firm bed or floor. Keeping your legs in front of you, bend your knees so they are both pointed toward the ceiling and your feet are flat on the floor.  Tense your abdominal muscles to brace your lower back and slowly lift both of your knees until they come over your hips. Be certain not to hold your breath.  POSTURE AND BODY MECHANICS CONSIDERATIONS - Low Back Strain Keeping correct posture when sitting, standing or completing your activities will reduce  the stress put on different body tissues, allowing injured tissues a chance to heal and limiting painful experiences. The following are general guidelines for improved posture. Your physician or physical therapist will provide you with any instructions specific to your needs. While reading these guidelines, remember:  The exercises prescribed by your provider will help you have the flexibility and strength to maintain correct postures.  The correct posture provides the best environment for your joints to work. All of your joints have less wear and tear when properly supported by a spine with good posture. This means you will experience a healthier, less painful body.  Correct posture must be practiced with all of your activities, especially prolonged sitting and standing. Correct posture is as important when doing repetitive low-stress activities (typing) as it is when doing a single heavy-load activity (lifting). RESTING POSITIONS Consider which positions are most painful for you when choosing a resting position. If you have pain with flexion-based activities (sitting, bending, stooping, squatting), choose a position that allows you to rest in a less flexed posture. You would want to avoid curling into a fetal position on your side. If your pain worsens with extension-based activities (prolonged standing, working overhead), avoid resting in an extended position such as sleeping on your stomach. Most people will find more comfort when they rest with their spine in a more neutral position, neither too rounded nor too arched. Lying on a non-sagging bed on your side with a pillow between your knees, or on your back with a pillow under your knees will often provide some relief. Keep in mind, being in any one position for a prolonged period of time, no matter how correct your posture, can still lead to stiffness. PROPER SITTING POSTURE In order to minimize stress and discomfort on your spine, you must sit with  correct posture. Sitting with good posture should be effortless for a healthy body. Returning to good posture is a gradual process. Many people can work toward this most comfortably by using various supports until they have the flexibility and strength to maintain this posture on their own. When sitting with proper posture, your ears will fall over your shoulders and your shoulders will fall over your hips. You should use the back of the chair to support your upper back. Your lower back  will be in a neutral position, just slightly arched. You may place a small pillow or folded towel at the base of your lower back for support.  When working at a desk, create an environment that supports good, upright posture. Without extra support, muscles tire, which leads to excessive strain on joints and other tissues. Keep these recommendations in mind: CHAIR:  A chair should be able to slide under your desk when your back makes contact with the back of the chair. This allows you to work closely.  The chair's height should allow your eyes to be level with the upper part of your monitor and your hands to be slightly lower than your elbows. BODY POSITION  Your feet should make contact with the floor. If this is not possible, use a foot rest.  Keep your ears over your shoulders. This will reduce stress on your neck and lower back. INCORRECT SITTING POSTURES  If you are feeling tired and unable to assume a healthy sitting posture, do not slouch or slump. This puts excessive strain on your back tissues, causing more damage and pain. Healthier options include:  Using more support, like a lumbar pillow.  Switching tasks to something that requires you to be upright or walking.  Talking a brief walk.  Lying down to rest in a neutral-spine position. PROLONGED STANDING WHILE SLIGHTLY LEANING FORWARD  When completing a task that requires you to lean forward while standing in one place for a long time, place either  foot up on a stationary 2-4 inch high object to help maintain the best posture. When both feet are on the ground, the lower back tends to lose its slight inward curve. If this curve flattens (or becomes too large), then the back and your other joints will experience too much stress, tire more quickly, and can cause pain. CORRECT STANDING POSTURES Proper standing posture should be assumed with all daily activities, even if they only take a few moments, like when brushing your teeth. As in sitting, your ears should fall over your shoulders and your shoulders should fall over your hips. You should keep a slight tension in your abdominal muscles to brace your spine. Your tailbone should point down to the ground, not behind your body, resulting in an over-extended swayback posture.  INCORRECT STANDING POSTURES  Common incorrect standing postures include a forward head, locked knees and/or an excessive swayback. WALKING Walk with an upright posture. Your ears, shoulders and hips should all line-up. PROLONGED ACTIVITY IN A FLEXED POSITION When completing a task that requires you to bend forward at your waist or lean over a low surface, try to find a way to stabilize 3 out of 4 of your limbs. You can place a hand or elbow on your thigh or rest a knee on the surface you are reaching across. This will provide you more stability so that your muscles do not fatigue as quickly. By keeping your knees relaxed, or slightly bent, you will also reduce stress across your lower back. CORRECT LIFTING TECHNIQUES DO :   Assume a wide stance. This will provide you more stability and the opportunity to get as close as possible to the object which you are lifting.  Tense your abdominals to brace your spine. Bend at the knees and hips. Keeping your back locked in a neutral-spine position, lift using your leg muscles. Lift with your legs, keeping your back straight.  Test the weight of unknown objects before attempting to lift  them.  Try  to keep your elbows locked down at your sides in order get the best strength from your shoulders when carrying an object.  Always ask for help when lifting heavy or awkward objects. INCORRECT LIFTING TECHNIQUES DO NOT:   Lock your knees when lifting, even if it is a small object.  Bend and twist. Pivot at your feet or move your feet when needing to change directions.  Assume that you can safely pick up even a paper clip without proper posture.      IF you received an x-ray today, you will receive an invoice from [facility]. Please contact [facility] at [phone] with questions or concerns regarding your invoice.   IF you received labwork today, you will receive an invoice from [facility]. Please contact [facility] at [phone] with questions or concerns regarding your invoice.   Our billing staff will not be able to assist you with questions regarding bills from these companies.  You will be contacted with the lab results as soon as they are available. The fastest way to get your results is to activate your My Chart account. Instructions are located on the last page of this paperwork. If you have not heard from [place] regarding the results in 2 weeks, please contact this office.

## 2018-04-08 NOTE — Progress Notes (Addendum)
Natalie Eaton  MRN: [id number] DOB: 1956-02-04  Subjective:  Natalie Eaton is a 62 y.o. female seen in office today for a chief complaint of low back pain. 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 Symptoms have been present for 1 day and are gradually improving.  Onset was related to / precipitated by no known injury and occured while doing work on the stairs. The pain is located in the right lumbar area or right gluteal area and does not radiate. The pain is described as aching and sharp and occurs intermittently. Symptoms are exacerbated by certain movements and sitting for long periods of time. Symptoms are improved by muscle relaxants. She denies weakness in the right leg, weakness in the left leg, tingling in the right leg, tingling in the left leg, burning pain in the right leg, burning pain in the left leg, urinary hesitancy, urinary incontinence, urinary retention, bowel incontinence, constipation, impotence and groin/perineal numbness associated with the back pain.  Review of Systems  Constitutional: Negative for chills, diaphoresis and fever.  Gastrointestinal: Negative for nausea and vomiting.  Genitourinary: Negative for hematuria.    Patient Active Problem List   Diagnosis Date Noted  . 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 BMI 32.0-32.9,adult 02/24/2018  . Essential hypertension, benign 11/21/2017  . Hypothyroidism 11/21/2017    Current Outpatient Medications on File Prior to Visit  Medication Sig Dispense Refill  . aspirin 81 MG chewable tablet Chew by mouth daily.    . celecoxib (CELEBREX) 200 MG capsule Take 1 capsule (200 mg total) by mouth 2 (two) times daily. 60 capsule 5  . estradiol (ESTRACE) 1 MG tablet Week 1 and 2: Take 1 tab PO x 6 days, then take 1/2 tab PO x 1 day;  Week 3 and 4: Take 1 tab PO x 5 days, then take 1/2 tab PO x 2 days 25  tablet 0  . gabapentin (NEURONTIN) 300 MG capsule Take 300 mg by mouth 3 (three) times daily.    . hydrochlorothiazide (HYDRODIURIL) 25 MG tablet Take 1 tablet (25 mg total) by mouth daily. 90 tablet 1  . levothyroxine (SYNTHROID, LEVOTHROID) 125 MCG tablet TAKE 1 TABLET BY MOUTH ONCE DAILY BEFORE BREAKFAST 30 tablet 2  . methocarbamol (ROBAXIN) 750 MG tablet Take 1 tablet (750 mg total) by mouth every 6 (six) hours as needed for muscle spasms. 120 tablet 5  . multivitamin-iron-minerals-folic acid (CENTRUM) chewable tablet Chew 1 tablet by mouth daily.    . potassium chloride SA (K-DUR,KLOR-CON) 20 MEQ tablet Take 20 mEq by mouth daily.    . traZODone (DESYREL) 100 MG tablet Take 1 tablet (100 mg total) by mouth at bedtime. 90 tablet 1   No current facility-administered medications on file prior to visit.     Allergies  Allergen Reactions  . Codeine      Objective:  BP 111/73 (BP Location: Right Arm, Patient Position: Sitting, Cuff Size: Large)   Pulse 78   Temp 99 F (37.2 C) (Oral)   Resp 20   Ht 5' 8.11" (1.73 m)   Wt 209 lb (94.8 kg)   SpO2 98%   BMI 31.68 kg/m   Physical Exam  Constitutional: She is oriented to person, place, and time. She appears well-developed and well-nourished. No distress.  HENT:  Head: Normocephalic and atraumatic.  Eyes: Conjunctivae are normal.  Neck: Normal range of  motion.  Pulmonary/Chest: Effort normal.  Abdominal: There is no CVA tenderness.  Musculoskeletal:       Thoracic back: Normal.       Lumbar back: She exhibits tenderness (with palpation of right sided lumbar musculature) and spasm. She exhibits normal range of motion and no bony tenderness.  Neurological: She is alert and oriented to person, place, and time. Gait normal.  Reflex Scores:      Patellar reflexes are 2+ on the right side and 2+ on the left side.      Achilles reflexes are 2+ on the right side and 2+ on the left side. Sensation of BLE intact. Strength of BLE 5/5.    Skin: Skin is warm and dry.  Psychiatric: She has a normal mood and affect.  Vitals reviewed.    Results for orders placed or performed in visit on 04/08/18 (from the past 24 hour(s))  POCT urinalysis dipstick     Status: None   Collection Time: 04/08/18  3:26 PM  Result Value Ref Range   Color, UA yellow yellow   Clarity, UA clear clear   Glucose, UA negative negative mg/dL   Bilirubin, UA negative negative   Ketones, POC UA negative negative mg/dL   Spec Grav, UA 1.015 1.010 - 1.025   Blood, UA negative negative   pH, UA 5.5 5.0 - 8.0   Protein Ur, POC negative negative mg/dL   Urobilinogen, UA 0.2 0.2 or 1.0 E.U./dL   Nitrite, UA Negative Negative   Leukocytes, UA Negative Negative    Assessment and Plan :  1. Acute right-sided low back pain without sciatica Hx and PE findings consistent with low back strain and spasm. No acute findings on neuro exam. No midline tenderness. Recommend conservative tx with rest, heating pad, NSAIDs, muscle relaxants, and stretching as tolerated. She has Rx for muscle relaxants and NSAIDs already. Does not need refills.  Given education material for low back stretches. Avoid heavy lifting until pain improves. Advised to return to clinic if symptoms worsen, do not improve, or as needed.  - POCT urinalysis dipstick    Tenna Delaine PA-C  [facility] 04/08/2018 3:22 PM

## 2018-05-07 ENCOUNTER — Telehealth: Payer: Self-pay | Admitting: General Practice

## 2018-05-07 MED ORDER — ESTRADIOL 1 MG PO TABS: ORAL_TABLET | ORAL | 0 refills | Status: DC

## 2018-05-07 NOTE — Telephone Encounter (Signed)
Copied from [place] [phone]. Topic: Quick Communication - Rx Refill/Question >> May 07, 2018 10:01 AM Scherrie Gerlach wrote: Medication: estradiol (ESTRACE) 1 MG tablet  Pt states she called [facility] a few days ago, but we do not have anything from them. [place], [place] - [address] [phone] (Phone) [phone] (Fax)

## 2018-05-07 NOTE — Telephone Encounter (Signed)
Patient called, left VM to return call to the office concerning the refill of Estradiol. It appears to be a tapered medication and need to clarify if she's truly needing a refill.

## 2018-05-07 NOTE — Telephone Encounter (Signed)
Patient returned call and says "I don't want to come off the hormone pill and Dr. Pamella Pert wants me to. I just need another refill until I can see my gynecologist on 05/28/18 and I will start getting them filled by my gynecologist.

## 2018-05-21 NOTE — Progress Notes (Signed)
Natalie Eaton  MRN: [id number] DOB: [birth year]  Subjective:  Natalie Eaton is a 62 y.o. female seen in office today for a chief complaint of low back pain. The patient has had recurrent self limited episodes of low back pain in the past. Symptoms have been present for years and are gradually worsening.  Onset was related to / precipitated by no known injury. The pain is located in the right lumbar area or right gluteal area and does not radiate. The pain is described as aching and sharp and occurs intermittently. Symptoms are exacerbated by certain movements and sitting for long periods of time. Symptoms are improved by muscle relaxants.  She is taking Robaxin 750 mg daily along with Celebrex 200 mg daily.  Heating pad is also effective.  She denies weakness in the right leg, weakness in the left leg, tingling in the right leg, tingling in the left leg, burning pain in the right leg, burning pain in the left leg, urinary hesitancy, urinary incontinence, urinary retention, bowel incontinence, constipation, impotence and groin/perineal numbness associated with the back pain.  Patient has past medical history of lumbar spine OA, scoliosis, and prior herniated disc.  She has been evaluated by orthopedic surgery before she moved to [facility].  Per patient, they recommended she proceed with surgery.  She unfortunately cannot afford surgery at this time due to her insurance.  She is trying to hold out until she gets Medicare at age 54.  She also has tried physical therapy in the past, which was effective, but unfortunately, she cannot afford PT at this time either.  Review of Systems Per HPI Patient Active Problem List   Diagnosis Date Noted  . 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 BMI 32.0-32.9,adult 02/24/2018  . Essential hypertension, benign 11/21/2017  . Hypothyroidism 11/21/2017    Current Outpatient Medications on File Prior to Visit  Medication Sig Dispense Refill  . aspirin  81 MG chewable tablet Chew by mouth daily.    . celecoxib (CELEBREX) 200 MG capsule Take 1 capsule (200 mg total) by mouth 2 (two) times daily. 60 capsule 5  . estradiol (ESTRACE) 1 MG tablet Week 1 and 2: Take 1 tab PO x 6 days, then take 1/2 tab PO x 1 day;  Week 3 and 4: Take 1 tab PO x 5 days, then take 1/2 tab PO x 2 days 25 tablet 0  . gabapentin (NEURONTIN) 300 MG capsule Take 300 mg by mouth 3 (three) times daily.    . hydrochlorothiazide (HYDRODIURIL) 25 MG tablet Take 1 tablet (25 mg total) by mouth daily. 90 tablet 1  . levothyroxine (SYNTHROID, LEVOTHROID) 125 MCG tablet TAKE 1 TABLET BY MOUTH ONCE DAILY BEFORE BREAKFAST 30 tablet 2  . methocarbamol (ROBAXIN) 750 MG tablet Take 1 tablet (750 mg total) by mouth every 6 (six) hours as needed for muscle spasms. 120 tablet 5  . multivitamin-iron-minerals-folic acid (CENTRUM) chewable tablet Chew 1 tablet by mouth daily.    . potassium chloride SA (K-DUR,KLOR-CON) 20 MEQ tablet Take 20 mEq by mouth daily.    . traZODone (DESYREL) 100 MG tablet Take 1 tablet (100 mg total) by mouth at bedtime. 90 tablet 1   No current facility-administered medications on file prior to visit.     Allergies  Allergen Reactions  . Codeine      Objective:  BP 128/86   Pulse 78   Temp 98 F (36.7 C) (Oral)   Resp 16  Ht 5' 7.91" (1.725 m)   Wt 197 lb 3.2 oz (89.4 kg)   SpO2 98%   BMI 30.06 kg/m   Physical Exam Constitutional: She is oriented to person, place, and time. She appears well-developed and well-nourished. No distress.  HENT:  Head: Normocephalic and atraumatic.  Eyes: Conjunctivae are normal.  Neck: Normal range of motion.  Pulmonary/Chest: Effort normal.  Abdominal: There is no CVA tenderness.  Musculoskeletal:       Thoracic back: Normal.       Lumbar back: She exhibits tenderness (with palpation of right sided lumbar and gluteal musculature) and spasm. She exhibits normal range of motion and no bony tenderness.  Neurological:  She is alert and oriented to person, place, and time. Gait normal.  Reflex Scores:      Patellar reflexes are 2+ on the right side and 2+ on the left side.      Achilles reflexes are 2+ on the right side and 2+ on the left side. Sensation of BLE intact. Strength of BLE 5/5.  Skin: Skin is warm and dry.  Psychiatric: She has a normal mood and affect.  Vitals reviewed.  Wt Readings from Last 3 Encounters:  05/22/18 197 lb 3.2 oz (89.4 kg)  04/08/18 209 lb (94.8 kg)  02/24/18 215 lb 6.4 oz (97.7 kg)   Dg Lumbar Spine Complete  Result Date: 05/22/2018 CLINICAL DATA:  Low back pain. EXAM: LUMBAR SPINE - COMPLETE 4+ VIEW COMPARISON:  No prior. FINDINGS: Surgical clips right upper quadrant. Stool noted throughout the colon. Constipation could present this fashion. No bowel distention or free air. Lumbar spine scoliosis concave right. Degenerative changes lumbar spine. Degenerative changes both hips. IMPRESSION: 1. Diffuse multilevel degenerative change with lumbar scoliosis concave right. No acute bony abnormality. 2. Stool noted throughout the colon. Constipation could present in this fashion. Electronically Signed   By: [place]   On: 05/22/2018 11:10    Assessment and Plan :  1. Chronic right-sided low back pain without sciatica 2. Spondylosis of lumbar region without myelopathy or radiculopathy Plain films of lumbar spine are consistent with diffuse multilevel OA.  No acute bony abnormality.  No acute findings on neuro exam. No midline tenderness.  Discussed options of treatment with patient with conservative measures, physical therapy, and evaluation by orthopedics.  Due to financial difficulty, patient is wanting to exhaust all of her conservative treatment options before seeing orthopedics.  Recommend increasing Robaxin from once daily to 2-3 times daily.  She can also increase Celebrex to twice daily.  Can also use Tylenol as prescribed as needed.  She has a list of physical therapy  stretches she was given in the past.  Recommend she start daily stretching.  I have placed a referral for physical therapy and she is going to contact her insurance to see how many sessions they will cover.  If no improvement after 2 weeks with increasing medications and stretching, she is going to attend physical therapy.  Advised to return to clinic if symptoms worsen, do not improve, or as needed.  - DG Lumbar Spine Complete; Future - Ambulatory referral to Physical Therapy  Side effects, risks, benefits, and alternatives of the medications and treatment plan prescribed today were discussed, and patient expressed understanding of the instructions given. No barriers to understanding were identified. Red flags discussed in detail. Pt expressed understanding regarding what to do in case of emergency/urgent symptoms.  Note - This record has been created using [employer].  Chart creation  errors have been sought, but may not always  have been located. Such creation errors do not reflect on  the standard of medical care.   Tenna Delaine PA-C  [facility] 05/22/2018 10:48 AM

## 2018-05-22 ENCOUNTER — Encounter: Payer: Self-pay | Admitting: Physician Assistant

## 2018-05-22 ENCOUNTER — Other Ambulatory Visit: Payer: Self-pay

## 2018-05-22 ENCOUNTER — Ambulatory Visit (INDEPENDENT_AMBULATORY_CARE_PROVIDER_SITE_OTHER): Admitting: Physician Assistant

## 2018-05-22 ENCOUNTER — Ambulatory Visit (INDEPENDENT_AMBULATORY_CARE_PROVIDER_SITE_OTHER)

## 2018-05-22 VITALS — BP 128/86 | HR 78 | Temp 98.0°F | Resp 16 | Ht 67.91 in | Wt 197.2 lb

## 2018-05-22 DIAGNOSIS — M47816 Spondylosis without myelopathy or radiculopathy, lumbar region: Secondary | ICD-10-CM

## 2018-05-22 DIAGNOSIS — G8929 Other chronic pain: Secondary | ICD-10-CM | POA: Diagnosis not present

## 2018-05-22 DIAGNOSIS — M545 Low back pain, unspecified: Secondary | ICD-10-CM

## 2018-05-22 NOTE — Patient Instructions (Addendum)
For back pain, increase celebrex to twice daily for the next week. You can take robaxin up to 3 times a day. Continue stretching and heating pad.  We will contact you with your xray results. I have placed referral for physical therapy and they should contact you within next 2 weeks.    FLEXION RANGE OF MOTION AND STRETCHING EXERCISES: STRETCH - Flexion, Single Knee to Chest   Lie on a firm bed or floor with both legs extended in front of you.  Keeping one leg in contact with the floor, bring your opposite knee to your chest. Hold your leg in place by either grabbing behind your thigh or at your knee.  Pull until you feel a gentle stretch in your lower back.   Slowly release your grasp and repeat the exercise with the opposite side.  STRETCH - Flexion, Double Knee to Chest   Lie on a firm bed or floor with both legs extended in front of you.  Keeping one leg in contact with the floor, bring your opposite knee to your chest.  Tense your stomach muscles to support your back and then lift your other knee to your chest. Hold your legs in place by either grabbing behind your thighs or at your knees.  Pull both knees toward your chest until you feel a gentle stretch in your lower back.   Tense your stomach muscles and slowly return one leg at a time to the floor.  STRETCH - Low Trunk Rotation  Lie on a firm bed or floor. Keeping your legs in front of you, bend your knees so they are both pointed toward the ceiling and your feet are flat on the floor.  Extend your arms out to the side. This will stabilize your upper body by keeping your shoulders in contact with the floor.  Gently and slowly drop both knees together to one side until you feel a gentle stretch in your lower back.   Tense your stomach muscles to support your lower back as you bring your knees back to the starting position. Repeat the exercise to the other side.   EXTENSION RANGE OF MOTION AND FLEXIBILITY  EXERCISES: STRETCH - Extension, Prone on Elbows   Lie on your stomach on the floor, a bed will be too soft. Place your palms about shoulder width apart and at the height of your head.  Place your elbows under your shoulders. If this is too painful, stack pillows under your chest.  Allow your body to relax so that your hips drop lower and make contact more completely with the floor.  Slowly return to lying flat on the floor.  RANGE OF MOTION - Extension, Prone Press Ups  Lie on your stomach on the floor, a bed will be too soft. Place your palms about shoulder width apart and at the height of your head.  Keeping your back as relaxed as possible, slowly straighten your elbows while keeping your hips on the floor. You may adjust the placement of your hands to maximize your comfort. As you gain motion, your hands will come more underneath your shoulders.  Slowly return to lying flat on the floor.  RANGE OF MOTION- Quadruped, Neutral Spine   Assume a hands and knees position on a firm surface. Keep your hands under your shoulders and your knees under your hips. You may place padding under your knees for comfort.  Drop your head and point your tail bone toward the ground below you. This will round  out your lower back like an angry cat.    Slowly lift your head and release your tail bone so that your back sags into a large arch, like an old horse.  Repeat this until you feel limber in your lower back.  Now, find your "sweet spot." This will be the most comfortable position somewhere between the two previous positions. This is your neutral spine. Once you have found this position, tense your stomach muscles to support your lower back.  STRENGTHENING EXERCISES - Low Back Strain These exercises may help you when beginning to rehabilitate your injury. These exercises should be done near your "sweet spot." This is the neutral, low-back arch, somewhere between fully rounded and fully arched, that  is your least painful position. When performed in this safe range of motion, these exercises can be used for people who have either a flexion or extension based injury. These exercises may resolve your symptoms with or without further involvement from your physician, physical therapist or athletic trainer. While completing these exercises, remember:   Muscles can gain both the endurance and the strength needed for everyday activities through controlled exercises.  Complete these exercises as instructed by your physician, physical therapist or athletic trainer. Increase the resistance and repetitions only as guided.  You may experience muscle soreness or fatigue, but the pain or discomfort you are trying to eliminate should never worsen during these exercises. If this pain does worsen, stop and make certain you are following the directions exactly. If the pain is still present after adjustments, discontinue the exercise until you can discuss the trouble with your caregiver.  STRENGTHENING - Deep Abdominals, Pelvic Tilt  Lie on a firm bed or floor. Keeping your legs in front of you, bend your knees so they are both pointed toward the ceiling and your feet are flat on the floor.  Tense your lower abdominal muscles to press your lower back into the floor. This motion will rotate your pelvis so that your tail bone is scooping upwards rather than pointing at your feet or into the floor.  STRENGTHENING - Abdominals, Crunches   Lie on a firm bed or floor. Keeping your legs in front of you, bend your knees so they are both pointed toward the ceiling and your feet are flat on the floor. Cross your arms over your chest.  Slightly tip your chin down without bending your neck.  Tense your abdominals and slowly lift your trunk high enough to just clear your shoulder blades. Lifting higher can put excessive stress on the lower back and does not further strengthen your abdominal muscles.  Control your return  to the starting position.  STRENGTHENING - Quadruped, Opposite UE/LE Lift   Assume a hands and knees position on a firm surface. Keep your hands under your shoulders and your knees under your hips. You may place padding under your knees for comfort.  Find your neutral spine and gently tense your abdominal muscles so that you can maintain this position. Your shoulders and hips should form a rectangle that is parallel with the floor and is not twisted.  Keeping your trunk steady, lift your right hand no higher than your shoulder and then your left leg no higher than your hip. Make sure you are not holding your breath.   Continuing to keep your abdominal muscles tense and your back steady, slowly return to your starting position. Repeat with the opposite arm and leg.  STRENGTHENING - Lower Abdominals, Double Knee Lift  Lie  on a firm bed or floor. Keeping your legs in front of you, bend your knees so they are both pointed toward the ceiling and your feet are flat on the floor.  Tense your abdominal muscles to brace your lower back and slowly lift both of your knees until they come over your hips. Be certain not to hold your breath.  POSTURE AND BODY MECHANICS CONSIDERATIONS - Low Back Strain Keeping correct posture when sitting, standing or completing your activities will reduce the stress put on different body tissues, allowing injured tissues a chance to heal and limiting painful experiences. The following are general guidelines for improved posture. Your physician or physical therapist will provide you with any instructions specific to your needs. While reading these guidelines, remember:  The exercises prescribed by your provider will help you have the flexibility and strength to maintain correct postures.  The correct posture provides the best environment for your joints to work. All of your joints have less wear and tear when properly supported by a spine with good posture. This means you  will experience a healthier, less painful body.  Correct posture must be practiced with all of your activities, especially prolonged sitting and standing. Correct posture is as important when doing repetitive low-stress activities (typing) as it is when doing a single heavy-load activity (lifting). RESTING POSITIONS Consider which positions are most painful for you when choosing a resting position. If you have pain with flexion-based activities (sitting, bending, stooping, squatting), choose a position that allows you to rest in a less flexed posture. You would want to avoid curling into a fetal position on your side. If your pain worsens with extension-based activities (prolonged standing, working overhead), avoid resting in an extended position such as sleeping on your stomach. Most people will find more comfort when they rest with their spine in a more neutral position, neither too rounded nor too arched. Lying on a non-sagging bed on your side with a pillow between your knees, or on your back with a pillow under your knees will often provide some relief. Keep in mind, being in any one position for a prolonged period of time, no matter how correct your posture, can still lead to stiffness. PROPER SITTING POSTURE In order to minimize stress and discomfort on your spine, you must sit with correct posture. Sitting with good posture should be effortless for a healthy body. Returning to good posture is a gradual process. Many people can work toward this most comfortably by using various supports until they have the flexibility and strength to maintain this posture on their own. When sitting with proper posture, your ears will fall over your shoulders and your shoulders will fall over your hips. You should use the back of the chair to support your upper back. Your lower back will be in a neutral position, just slightly arched. You may place a small pillow or folded towel at the base of your lower back for  support.  When working at a desk, create an environment that supports good, upright posture. Without extra support, muscles tire, which leads to excessive strain on joints and other tissues. Keep these recommendations in mind: CHAIR:  A chair should be able to slide under your desk when your back makes contact with the back of the chair. This allows you to work closely.  The chair's height should allow your eyes to be level with the upper part of your monitor and your hands to be slightly lower than your elbows. BODY  POSITION  Your feet should make contact with the floor. If this is not possible, use a foot rest.  Keep your ears over your shoulders. This will reduce stress on your neck and lower back. INCORRECT SITTING POSTURES  If you are feeling tired and unable to assume a healthy sitting posture, do not slouch or slump. This puts excessive strain on your back tissues, causing more damage and pain. Healthier options include:  Using more support, like a lumbar pillow.  Switching tasks to something that requires you to be upright or walking.  Talking a brief walk.  Lying down to rest in a neutral-spine position. PROLONGED STANDING WHILE SLIGHTLY LEANING FORWARD  When completing a task that requires you to lean forward while standing in one place for a long time, place either foot up on a stationary 2-4 inch high object to help maintain the best posture. When both feet are on the ground, the lower back tends to lose its slight inward curve. If this curve flattens (or becomes too large), then the back and your other joints will experience too much stress, tire more quickly, and can cause pain. CORRECT STANDING POSTURES Proper standing posture should be assumed with all daily activities, even if they only take a few moments, like when brushing your teeth. As in sitting, your ears should fall over your shoulders and your shoulders should fall over your hips. You should keep a slight tension in  your abdominal muscles to brace your spine. Your tailbone should point down to the ground, not behind your body, resulting in an over-extended swayback posture.  INCORRECT STANDING POSTURES  Common incorrect standing postures include a forward head, locked knees and/or an excessive swayback. WALKING Walk with an upright posture. Your ears, shoulders and hips should all line-up. PROLONGED ACTIVITY IN A FLEXED POSITION When completing a task that requires you to bend forward at your waist or lean over a low surface, try to find a way to stabilize 3 out of 4 of your limbs. You can place a hand or elbow on your thigh or rest a knee on the surface you are reaching across. This will provide you more stability so that your muscles do not fatigue as quickly. By keeping your knees relaxed, or slightly bent, you will also reduce stress across your lower back. CORRECT LIFTING TECHNIQUES DO :   Assume a wide stance. This will provide you more stability and the opportunity to get as close as possible to the object which you are lifting.  Tense your abdominals to brace your spine. Bend at the knees and hips. Keeping your back locked in a neutral-spine position, lift using your leg muscles. Lift with your legs, keeping your back straight.  Test the weight of unknown objects before attempting to lift them.  Try to keep your elbows locked down at your sides in order get the best strength from your shoulders when carrying an object.  Always ask for help when lifting heavy or awkward objects. INCORRECT LIFTING TECHNIQUES DO NOT:   Lock your knees when lifting, even if it is a small object.  Bend and twist. Pivot at your feet or move your feet when needing to change directions.  Assume that you can safely pick up even a paper clip without proper posture.       IF you received an x-ray today, you will receive an invoice from [facility]. Please contact [facility] at [phone] with  questions or concerns regarding your invoice.  IF you received labwork today, you will receive an invoice from [facility]. Please contact [facility] at [phone] with questions or concerns regarding your invoice.   Our billing staff will not be able to assist you with questions regarding bills from these companies.  You will be contacted with the lab results as soon as they are available. The fastest way to get your results is to activate your My Chart account. Instructions are located on the last page of this paperwork. If you have not heard from [place] regarding the results in 2 weeks, please contact this office.

## 2018-05-23 ENCOUNTER — Encounter: Payer: Self-pay | Admitting: Physician Assistant

## 2018-05-28 ENCOUNTER — Telehealth: Payer: Self-pay | Admitting: Physician Assistant

## 2018-05-28 NOTE — Telephone Encounter (Signed)
Copied from [place] [id number]. Topic: Quick Communication - See Telephone Encounter >> May 28, 2018  1:49 PM Vernona Rieger wrote: CRM for notification. See Telephone encounter for: 05/28/18.  Patient would like [place] to call her to discuss her xrays

## 2018-05-28 NOTE — Telephone Encounter (Signed)
FYI please see note below

## 2018-05-28 NOTE — Telephone Encounter (Signed)
Copied from [place] [id number]. Topic: Quick Communication - See Telephone Encounter >> May 28, 2018  1:05 PM Bea Graff, NT wrote: CRM for notification. See Telephone encounter for: 05/28/18. Pt calling and states that the increase with Celebrex is working. She states [place] wanted an update.

## 2018-06-01 ENCOUNTER — Other Ambulatory Visit: Payer: Self-pay | Admitting: Family Medicine

## 2018-06-02 NOTE — Telephone Encounter (Signed)
Please advise: Pt would like to discuss her x-ray results

## 2018-06-04 NOTE — Telephone Encounter (Signed)
Patient contacted and wanted to discuss why her imaging results showed surgical clips in her right upper quadrant but she has already discussed this with her gynecologist.

## 2018-06-16 ENCOUNTER — Other Ambulatory Visit: Payer: Self-pay | Admitting: Urgent Care

## 2018-07-23 ENCOUNTER — Encounter (HOSPITAL_COMMUNITY): Payer: Self-pay

## 2018-07-23 ENCOUNTER — Emergency Department (HOSPITAL_COMMUNITY)

## 2018-07-23 ENCOUNTER — Emergency Department (HOSPITAL_COMMUNITY)
Admission: EM | Admit: 2018-07-23 | Discharge: 2018-07-23 | Disposition: A | Discharge status: Home / Self Care | Attending: Emergency Medicine | Admitting: Emergency Medicine

## 2018-07-23 ENCOUNTER — Other Ambulatory Visit: Payer: Self-pay

## 2018-07-23 DIAGNOSIS — E039 Hypothyroidism, unspecified: Secondary | ICD-10-CM | POA: Insufficient documentation

## 2018-07-23 DIAGNOSIS — I2510 Atherosclerotic heart disease of native coronary artery without angina pectoris: Secondary | ICD-10-CM | POA: Diagnosis not present

## 2018-07-23 DIAGNOSIS — Z7982 Long term (current) use of aspirin: Secondary | ICD-10-CM | POA: Insufficient documentation

## 2018-07-23 DIAGNOSIS — R509 Fever, unspecified: Secondary | ICD-10-CM | POA: Diagnosis present

## 2018-07-23 DIAGNOSIS — J069 Acute upper respiratory infection, unspecified: Secondary | ICD-10-CM | POA: Diagnosis not present

## 2018-07-23 DIAGNOSIS — Z79899 Other long term (current) drug therapy: Secondary | ICD-10-CM | POA: Insufficient documentation

## 2018-07-23 DIAGNOSIS — Z87891 Personal history of nicotine dependence: Secondary | ICD-10-CM | POA: Insufficient documentation

## 2018-07-23 DIAGNOSIS — I10 Essential (primary) hypertension: Secondary | ICD-10-CM | POA: Insufficient documentation

## 2018-07-23 HISTORY — DX: Epilepsy, unspecified, not intractable, without status epilepticus: G40.909

## 2018-07-23 HISTORY — DX: Unspecified intracranial injury with loss of consciousness status unknown, initial encounter: S06.9XAA

## 2018-07-23 HISTORY — DX: Unspecified intracranial injury with loss of consciousness of unspecified duration, initial encounter: S06.9X9A

## 2018-07-23 HISTORY — DX: Atherosclerotic heart disease of native coronary artery without angina pectoris: I25.10

## 2018-07-23 HISTORY — DX: Nonrheumatic mitral (valve) prolapse: I34.1

## 2018-07-23 HISTORY — DX: Pneumonia, unspecified organism: J18.9

## 2018-07-23 MED ORDER — AZITHROMYCIN 250 MG PO TABS: 250.0000 mg | ORAL_TABLET | Freq: Every day | ORAL | 0 refills | Status: DC

## 2018-07-23 MED ORDER — HYDROCOD POLST-CPM POLST ER 10-8 MG/5ML PO SUER: 5.0000 mL | Freq: Two times a day (BID) | ORAL | 0 refills | Status: DC | PRN

## 2018-07-23 MED ORDER — PREDNISONE 20 MG PO TABS: 40.0000 mg | ORAL_TABLET | Freq: Every day | ORAL | 0 refills | Status: DC

## 2018-07-23 NOTE — ED Notes (Signed)
ED Provider at bedside. [place]

## 2018-07-23 NOTE — ED Provider Notes (Signed)
[facility] DEPT Provider Note   CSN: [id number] Arrival date & time: 07/23/18  0930     History   Chief Complaint Chief Complaint  Patient presents with  . Sore Throat  . Fever  . URI    HPI Natalie Eaton is a 62 y.o. female.  The history is provided by the patient.  Fever   Associated symptoms include chest pain, congestion, sore throat and cough. Pertinent negatives include no vomiting.  URI   This is a new problem. Episode onset: 6 days ago. The problem has been gradually worsening. There has been no fever. Associated symptoms include chest pain, congestion, plugged ear sensation, rhinorrhea, sore throat and cough. Pertinent negatives include no abdominal pain, no nausea, no vomiting and no wheezing. Associated symptoms comments: Heavy sensation on the chest and pain with coughing. Treatments tried: mucinex. The treatment provided no relief.    Past Medical History:  Diagnosis Date  . Back pain   . Closed TBI (traumatic brain injury) ([place])   . Coronary artery disease   . Epilepsy ([place])   . History of hysterectomy   . 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 Hypertension   . Hypothyroidism   . Insomnia   . Pneumonia   . Pre-diabetes     Patient Active Problem List   Diagnosis Date Noted  . 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 BMI 32.0-32.9,adult 02/24/2018  . Essential hypertension, benign 11/21/2017  . Hypothyroidism 11/21/2017    Past Surgical History:  Procedure Laterality Date  . ABDOMINAL HYSTERECTOMY    . BLADDER SURGERY    . CARDIAC CATHETERIZATION    . GALLBLADDER SURGERY    . LUNG REMOVAL, PARTIAL    . ROTATOR CUFF REPAIR    . TONSILLECTOMY       OB History   None      Home Medications    Prior to Admission medications   Medication Sig Start Date End Date Taking? Authorizing Provider  aspirin 81 MG chewable tablet Chew by mouth daily.    [provider]  celecoxib (CELEBREX) 200 MG capsule Take 1 capsule (200 mg total) by mouth 2 (two) times daily. 11/21/17   Rutherford Guys, MD  estradiol (ESTRACE) 1 MG tablet Week 1 and 2: Take 1 tab PO x 6 days, then take 1/2 tab PO x 1 day;  Week 3 and 4: Take 1 tab PO x 5 days, then take 1/2 tab PO x 2 days 05/07/18   Rutherford Guys, MD  gabapentin (NEURONTIN) 300 MG capsule Take 300 mg by mouth 3 (three) times daily.    [provider]  hydrochlorothiazide (HYDRODIURIL) 25 MG tablet TAKE 1 TABLET BY MOUTH ONCE DAILY 06/01/18   Rutherford Guys, MD  levothyroxine (SYNTHROID, LEVOTHROID) 125 MCG tablet TAKE 1 TABLET BY MOUTH ONCE DAILY BEFORE BREAKFAST 06/16/18   Rutherford Guys, MD  methocarbamol (ROBAXIN) 750 MG tablet Take 1 tablet (750 mg total) by mouth every 6 (six) hours as needed for muscle spasms. 11/21/17   Rutherford Guys, MD  multivitamin-iron-minerals-folic acid (CENTRUM) chewable tablet Chew 1 tablet by mouth daily.    [provider]  potassium chloride SA (K-DUR,KLOR-CON) 20 MEQ tablet Take 20 mEq by mouth daily.    [provider]  traZODone (DESYREL) 100 MG tablet Take 1 tablet (100 mg total) by mouth at bedtime. 11/21/17   Rutherford Guys,  MD    Family History Family History  Problem Relation Age of Onset  . Healthy Mother   . Heart disease Brother   . Healthy Daughter   . Healthy Son     Social History Social History   Tobacco Use  . Smoking status: Former Research scientist (life sciences)  . Smokeless tobacco: Never Used  Substance Use Topics  . Alcohol use: Yes    Alcohol/week: 1.0 standard drinks    Types: 1 Glasses of wine per week    Frequency: Never  . Drug use: No     Allergies   Codeine   Review of Systems Review of Systems  Constitutional: Positive for fever.  HENT: Positive for congestion, rhinorrhea and sore throat.   Respiratory: Positive for cough. Negative for wheezing.   Cardiovascular: Positive for chest pain.  Gastrointestinal:  Negative for abdominal pain, nausea and vomiting.  All other systems reviewed and are negative.    Physical Exam Updated Vital Signs BP (!) 132/91 (BP Location: Right Arm)   Pulse 87   Temp 97.9 F (36.6 C) (Oral)   Resp (!) 22   Ht 5\' 8"  (1.727 m)   Wt 90.7 kg   SpO2 98%   BMI 30.41 kg/m   Physical Exam  Constitutional: She is oriented to person, place, and time. She appears well-developed and well-nourished. No distress.  HENT:  Head: Normocephalic and atraumatic.  Right Ear: Tympanic membrane normal.  Left Ear: Tympanic membrane normal.  Nose: Mucosal edema and rhinorrhea present.  Mouth/Throat: Mucous membranes are normal. Posterior oropharyngeal erythema present. No oropharyngeal exudate or posterior oropharyngeal edema. No tonsillar exudate.  Eyes: Pupils are equal, round, and reactive to light. Conjunctivae and EOM are normal.  Neck: Normal range of motion. Neck supple.  Cardiovascular: Normal rate, regular rhythm and intact distal pulses.  No murmur heard. Pulmonary/Chest: Effort normal and breath sounds normal. No respiratory distress. She has no wheezes. She has no rales.  Abdominal: Soft. She exhibits no distension. There is no tenderness. There is no rebound and no guarding.  Musculoskeletal: Normal range of motion. She exhibits no edema or tenderness.  Neurological: She is alert and oriented to person, place, and time.  Skin: Skin is warm and dry. No rash noted. No erythema.  Psychiatric: She has a normal mood and affect. Her behavior is normal.  Nursing note and vitals reviewed.    ED Treatments / Results  Labs (all labs ordered are listed, but only abnormal results are displayed) Labs Reviewed - No data to display  EKG None  Radiology Dg Chest 2 View  Result Date: 07/23/2018 CLINICAL DATA:  Productive cough. Pharyngitis. Ex-smoker. EXAM: CHEST - 2 VIEW COMPARISON:  None. FINDINGS: Normal sized heart. Right upper lobe bullous changes. The lungs are  hyperexpanded. No airspace consolidation. Mild to moderate scoliosis. IMPRESSION: 1. No acute abnormality. 2. Changes of COPD. Electronically Signed   By: Claudie Revering M.D.   On: 07/23/2018 10:20    Procedures Procedures (including critical care time)  Medications Ordered in ED Medications - No data to display   Initial Impression / Assessment and Plan / ED Course  I have reviewed the triage vital signs and the nursing notes.  Pertinent labs & imaging results that were available during my care of the patient were reviewed by me and considered in my medical decision making (see chart for details).     Patient is a 62 year old female with a prior history of lung surgery when she was 4, coronary artery  disease, hypertension and prediabetes presenting with URI symptoms for the last 6 days.  She has had significant nasal congestion, chills, cough.  She denies any shortness of breath.  She has been in the hospital a lot as her mother was here for aspiration pneumonia.  She has not had a flu shot this year.  Patient's vital signs are reassuring with oxygen saturation of 98%.  She is in no acute distress on exam.  Lung sounds are clear throughout.  We will do an x-ray to assure no developing pneumonia.  Feel most likely viral in nature.  10:40 AM CXR without acute findings and pt d/ced home.  Final Clinical Impressions(s) / ED Diagnoses   Final diagnoses:  Viral upper respiratory tract infection    ED Discharge Orders         Ordered    chlorpheniramine-HYDROcodone (TUSSIONEX PENNKINETIC ER) 10-8 MG/5ML SUER  2 times daily PRN     07/23/18 1042    predniSONE (DELTASONE) 20 MG tablet  Daily     07/23/18 1042    azithromycin (ZITHROMAX) 250 MG tablet  Daily     07/23/18 1042           Blanchie Dessert, MD 07/23/18 1042

## 2018-07-23 NOTE — ED Notes (Signed)
Patient transported to X-ray 

## 2018-07-23 NOTE — ED Triage Notes (Signed)
Pt c/o sore throat and cold like symptoms, productive clear cough. Clear nasal congestion. Fever without chills symptoms since Saturday. Pt believes it is viral. Mother was DX with pneumonia and discharge from here last week.

## 2018-07-23 NOTE — ED Notes (Addendum)
PICK UP RX X 3

## 2018-07-29 ENCOUNTER — Telehealth: Payer: Self-pay | Admitting: Physician Assistant

## 2018-07-29 NOTE — Telephone Encounter (Signed)
LVM for pt to call the office and get rescheduled due to [facility] leaving the practice. I advised of the providers that are accepting new patients. If/When the pt calls back, please reschedule with Eliezer Mccoy or Dante for an OV for a 6 month F/U.  Thank you!

## 2018-08-20 ENCOUNTER — Ambulatory Visit: Admitting: Physician Assistant

## 2018-08-21 ENCOUNTER — Encounter: Payer: Self-pay | Admitting: Family Medicine

## 2018-08-21 ENCOUNTER — Other Ambulatory Visit: Payer: Self-pay

## 2018-08-21 ENCOUNTER — Ambulatory Visit (INDEPENDENT_AMBULATORY_CARE_PROVIDER_SITE_OTHER): Admitting: Family Medicine

## 2018-08-21 VITALS — BP 137/85 | HR 78 | Temp 97.5°F | Ht 68.0 in | Wt 213.2 lb

## 2018-08-21 DIAGNOSIS — R7303 Prediabetes: Secondary | ICD-10-CM

## 2018-08-21 DIAGNOSIS — Z23 Encounter for immunization: Secondary | ICD-10-CM

## 2018-08-21 DIAGNOSIS — E039 Hypothyroidism, unspecified: Secondary | ICD-10-CM

## 2018-08-21 DIAGNOSIS — Z1211 Encounter for screening for malignant neoplasm of colon: Secondary | ICD-10-CM

## 2018-08-21 DIAGNOSIS — I10 Essential (primary) hypertension: Secondary | ICD-10-CM

## 2018-08-21 MED ORDER — LEVOTHYROXINE SODIUM 125 MCG PO TABS: ORAL_TABLET | ORAL | 3 refills | Status: DC

## 2018-08-21 MED ORDER — CELECOXIB 200 MG PO CAPS: 200.0000 mg | ORAL_CAPSULE | Freq: Two times a day (BID) | ORAL | 1 refills | Status: DC

## 2018-08-21 MED ORDER — GABAPENTIN 300 MG PO CAPS: 600.0000 mg | ORAL_CAPSULE | Freq: Every day | ORAL | 0 refills | Status: DC

## 2018-08-21 MED ORDER — METHOCARBAMOL 750 MG PO TABS: 750.0000 mg | ORAL_TABLET | Freq: Three times a day (TID) | ORAL | 1 refills | Status: DC | PRN

## 2018-08-21 MED ORDER — TRAZODONE HCL 50 MG PO TABS: 25.0000 mg | ORAL_TABLET | Freq: Every evening | ORAL | 1 refills | Status: DC | PRN

## 2018-08-21 MED ORDER — POTASSIUM CHLORIDE CRYS ER 20 MEQ PO TBCR: 20.0000 meq | EXTENDED_RELEASE_TABLET | Freq: Every day | ORAL | 1 refills | Status: DC

## 2018-08-21 MED ORDER — HYDROCHLOROTHIAZIDE 25 MG PO TABS: 25.0000 mg | ORAL_TABLET | Freq: Every day | ORAL | 3 refills | Status: DC

## 2018-08-21 NOTE — Progress Notes (Signed)
12/6/201911:40 AM  Natalie Eaton August 19, 1956, 62 y.o. female [id number]  Chief Complaint  Patient presents with  . Follow-up    chk up for hypertension, hypothyroidism. Cologuard ordered today    HPI:   Patient is a 62 y.o. female with past medical history significant for HTN, hypothyroidism, pre-diabetes, back pain with sciatica and obesity who presents today for follow-up who presents today for routine followup  Had to stop weight watchers due to finances Has noticed carbs are an issue Takes trazadone as needed if she wakes up around midnight, helps her go back to sleep Doing well sciatica wise with gabapentin and celebrex, takes methocarbamol as needed Reviewed meds  Had WWE this year at [facility]  Lab Results  Component Value Date   HGBA1C 5.6 11/21/2017   Lab Results  Component Value Date   LDLCALC 121 (H) 11/21/2017   CREATININE 0.77 11/21/2017   Lab Results  Component Value Date   TSH 0.662 11/21/2017    Fall Risk  08/21/2018 05/22/2018 04/08/2018 02/24/2018 12/19/2017  Falls in the past year? 0 Yes No No No  Number falls in past yr: - 1 - - -  Injury with Fall? - No - - -     Depression screen [facility] 2/9 08/21/2018 05/22/2018 04/08/2018  Decreased Interest 0 0 0  Down, Depressed, Hopeless 0 0 0  PHQ - 2 Score 0 0 0    Allergies  Allergen Reactions  . Codeine Other (See Comments)    Tingling on her scalp.    Prior to Admission medications   Medication Sig Start Date End Date Taking? Authorizing Provider  aspirin 81 MG chewable tablet Chew 81 mg by mouth daily.    Yes [provider]  celecoxib (CELEBREX) 200 MG capsule Take 1 capsule (200 mg total) by mouth 2 (two) times daily. 11/21/17  Yes Rutherford Guys, MD  estradiol (ESTRACE) 1 MG tablet Week 1 and 2: Take 1 tab PO x 6 days, then take 1/2 tab PO x 1 day;  Week 3 and 4: Take 1 tab PO x 5 days, then take 1/2 tab PO x 2 days Patient taking differently: Take 1 mg by mouth daily.  05/07/18  Yes  Rutherford Guys, MD  gabapentin (NEURONTIN) 300 MG capsule Take 600 mg by mouth at bedtime.    Yes [provider]  hydrochlorothiazide (HYDRODIURIL) 25 MG tablet TAKE 1 TABLET BY MOUTH ONCE DAILY Patient taking differently: Take 25 mg by mouth daily.  06/01/18  Yes Rutherford Guys, MD  levothyroxine (SYNTHROID, LEVOTHROID) 125 MCG tablet TAKE 1 TABLET BY MOUTH ONCE DAILY BEFORE BREAKFAST Patient taking differently: Take 125 mcg by mouth daily before breakfast.  06/16/18  Yes Rutherford Guys, MD  methocarbamol (ROBAXIN) 750 MG tablet Take 1 tablet (750 mg total) by mouth every 6 (six) hours as needed for muscle spasms. Patient taking differently: Take 375 mg by mouth every 6 (six) hours as needed for muscle spasms.  11/21/17  Yes Rutherford Guys, MD  Multiple Vitamins-Minerals (HAIR SKIN AND NAILS FORMULA PO) Take 1 tablet by mouth daily.   Yes [provider]  potassium chloride SA (K-DUR,KLOR-CON) 20 MEQ tablet Take 20 mEq by mouth See admin instructions. Takes two days a week.   Yes [provider]  traZODone (DESYREL) 100 MG tablet Take 1 tablet (100 mg total) by mouth at bedtime. Patient taking differently: Take 25-50 mg by mouth at bedtime as needed for sleep.  11/21/17  Yes Rutherford Guys, MD  valACYclovir (VALTREX) 500 MG tablet Take 500 mg by mouth daily. 06/01/18  Yes [provider]  vitamin E 400 UNIT capsule Take 400 Units by mouth 2 (two) times daily.   Yes [provider]    Past Medical History:  Diagnosis Date  . Back pain   . Closed TBI (traumatic brain injury) ([place])   . Coronary artery disease   . Epilepsy ([place])   . History of hysterectomy   . 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 Hypertension   . Hypothyroidism   . Insomnia   . MVP (mitral valve prolapse)   . Pneumonia   . Pre-diabetes     Past Surgical History:  Procedure Laterality Date  . ABDOMINAL HYSTERECTOMY    . BLADDER  SURGERY    . CARDIAC CATHETERIZATION    . GALLBLADDER SURGERY    . LUNG REMOVAL, PARTIAL    . ROTATOR CUFF REPAIR    . TONSILLECTOMY      Social History   Tobacco Use  . Smoking status: Former Research scientist (life sciences)  . Smokeless tobacco: Never Used  Substance Use Topics  . Alcohol use: Yes    Alcohol/week: 1.0 standard drinks    Types: 1 Glasses of wine per week    Frequency: Never    Family History  Problem Relation Age of Onset  . Healthy Mother   . Heart disease Brother   . Healthy Daughter   . Healthy Son     Review of Systems  Constitutional: Negative for chills and fever.  Respiratory: Positive for cough. Negative for shortness of breath.   Cardiovascular: Negative for chest pain, palpitations and leg swelling.  Gastrointestinal: Negative for abdominal pain, nausea and vomiting.  Genitourinary: Negative for dysuria and hematuria.  Musculoskeletal: Positive for joint pain.     OBJECTIVE:  Blood pressure 137/85, pulse 78, temperature (!) 97.5 F (36.4 C), temperature source Oral, height '5\' 8"'  (1.727 m), weight 213 lb 3.2 oz (96.7 kg), SpO2 98 %. Body mass index is 32.42 kg/m.   BP Readings from Last 3 Encounters:  08/21/18 137/85  07/23/18 (!) 117/95  05/22/18 128/86   Wt Readings from Last 3 Encounters:  08/21/18 213 lb 3.2 oz (96.7 kg)  07/23/18 200 lb (90.7 kg)  05/22/18 197 lb 3.2 oz (89.4 kg)    Physical Exam  Constitutional: She is oriented to person, place, and time. She appears well-developed and well-nourished.  HENT:  Head: Normocephalic and atraumatic.  Mouth/Throat: Oropharynx is clear and moist. No oropharyngeal exudate.  Eyes: Pupils are equal, round, and reactive to light. Conjunctivae and EOM are normal. No scleral icterus.  Neck: Neck supple.  Cardiovascular: Normal rate, regular rhythm and normal heart sounds. Exam reveals no gallop and no friction rub.  No murmur heard. Pulmonary/Chest: Effort normal and breath sounds normal. She has no wheezes.  She has no rales.  Musculoskeletal: She exhibits no edema.  Neurological: She is alert and oriented to person, place, and time.  Skin: Skin is warm and dry.  Psychiatric: She has a normal mood and affect.  Nursing note and vitals reviewed.   ASSESSMENT and PLAN  1. Hypothyroidism, unspecified type Checking labs today, medications will be adjusted as needed.  - TSH  2. Screening for colon cancer - Cologuard  3. Prediabetes Checking labs today, medications will be adjusted as needed. Discussed importance of low carb diet, regular exercise and  healthy weight.  - Hemoglobin A1c  4. Essential hypertension, benign Controlled. Continue current regime.  - Lipid panel - CMP14+EGFR  Other orders - hydrochlorothiazide (HYDRODIURIL) 25 MG tablet; Take 1 tablet (25 mg total) by mouth daily. - levothyroxine (SYNTHROID, LEVOTHROID) 125 MCG tablet; TAKE 1 TABLET BY MOUTH ONCE DAILY BEFORE BREAKFAST - potassium chloride SA (K-DUR,KLOR-CON) 20 MEQ tablet; Take 1 tablet (20 mEq total) by mouth daily. Takes two days a week. - celecoxib (CELEBREX) 200 MG capsule; Take 1 capsule (200 mg total) by mouth 2 (two) times daily. - gabapentin (NEURONTIN) 300 MG capsule; Take 2 capsules (600 mg total) by mouth at bedtime. - methocarbamol (ROBAXIN) 750 MG tablet; Take 1 tablet (750 mg total) by mouth every 8 (eight) hours as needed for muscle spasms. - traZODone (DESYREL) 50 MG tablet; Take 0.5-1 tablets (25-50 mg total) by mouth at bedtime as needed for sleep.   Return in about 6 months (around 02/20/2019) for routine .    Rutherford Guys, MD Primary Care at [facility] [place], [place] [address] Ph.  [phone] Fax [phone]

## 2018-08-21 NOTE — Patient Instructions (Signed)
° ° ° °  If you have lab work done today you will be contacted with your lab results within the next 2 weeks.  If you have not heard from us then please contact us. The fastest way to get your results is to register for My Chart. ° ° °IF you received an x-ray today, you will receive an invoice from [facility]. Please contact [facility] at [phone] with questions or concerns regarding your invoice.  ° °IF you received labwork today, you will receive an invoice from [facility]. Please contact [facility] at [phone] with questions or concerns regarding your invoice.  ° °Our billing staff will not be able to assist you with questions regarding bills from these companies. ° °You will be contacted with the lab results as soon as they are available. The fastest way to get your results is to activate your My Chart account. Instructions are located on the last page of this paperwork. If you have not heard from us regarding the results in 2 weeks, please contact this office. °  ° ° ° °

## 2018-08-22 LAB — CMP14+EGFR
ALT: 18 IU/L (ref 0–32)
AST: 16 IU/L (ref 0–40)
Albumin/Globulin Ratio: 1.7 (ref 1.2–2.2)
Albumin: 4.1 g/dL (ref 3.6–4.8)
Alkaline Phosphatase: 75 IU/L (ref 39–117)
BUN/Creatinine Ratio: 19 (ref 12–28)
BUN: 18 mg/dL (ref 8–27)
Bilirubin Total: 0.2 mg/dL (ref 0.0–1.2)
CO2: 25 mmol/L (ref 20–29)
Calcium: 9.7 mg/dL (ref 8.7–10.3)
Chloride: 99 mmol/L (ref 96–106)
Creatinine, Ser: 0.94 mg/dL (ref 0.57–1.00)
GFR calc Af Amer: 75 mL/min/{1.73_m2} (ref >59)
GFR calc non Af Amer: 65 mL/min/{1.73_m2} (ref >59)
Globulin, Total: 2.4 g/dL (ref 1.5–4.5)
Glucose: 88 mg/dL (ref 65–99)
Potassium: 4.5 mmol/L (ref 3.5–5.2)
Sodium: 138 mmol/L (ref 134–144)
Total Protein: 6.5 g/dL (ref 6.0–8.5)

## 2018-08-22 LAB — HEMOGLOBIN A1C
Est. average glucose Bld gHb Est-mCnc: 120 mg/dL
Hgb A1c MFr Bld: 5.8 % — ABNORMAL HIGH (ref 4.8–5.6)

## 2018-08-22 LAB — LIPID PANEL
Chol/HDL Ratio: 5.9 ratio — ABNORMAL HIGH (ref 0.0–4.4)
Cholesterol, Total: 236 mg/dL — ABNORMAL HIGH (ref 100–199)
HDL: 40 mg/dL (ref >39)
LDL Calculated: 139 mg/dL — ABNORMAL HIGH (ref 0–99)
Triglycerides: 285 mg/dL — ABNORMAL HIGH (ref 0–149)
VLDL Cholesterol Cal: 57 mg/dL — ABNORMAL HIGH (ref 5–40)

## 2018-08-22 LAB — TSH: TSH: 0.305 u[IU]/mL — ABNORMAL LOW (ref 0.450–4.500)

## 2018-08-24 ENCOUNTER — Telehealth: Payer: Self-pay | Admitting: *Deleted

## 2018-08-24 NOTE — Telephone Encounter (Signed)
Spoke with pharmacy gave clarification on potassium chloride -take 1 tablet daily.

## 2018-08-27 ENCOUNTER — Ambulatory Visit: Admitting: Family Medicine

## 2018-08-28 ENCOUNTER — Other Ambulatory Visit: Payer: Self-pay | Admitting: Family Medicine

## 2018-08-28 ENCOUNTER — Encounter: Payer: Self-pay | Admitting: Family Medicine

## 2018-08-28 MED ORDER — ATORVASTATIN CALCIUM 40 MG PO TABS: 40.0000 mg | ORAL_TABLET | Freq: Every day | ORAL | 3 refills | Status: DC

## 2018-08-28 MED ORDER — LEVOTHYROXINE SODIUM 112 MCG PO TABS: ORAL_TABLET | ORAL | 0 refills | Status: DC

## 2018-08-28 NOTE — Progress Notes (Signed)
atorvas ?

## 2018-08-28 NOTE — Addendum Note (Signed)
Addended by: Rutherford Guys on: 08/28/2018 09:16 PM   Modules accepted: Orders

## 2018-09-01 ENCOUNTER — Telehealth: Payer: Self-pay | Admitting: Family Medicine

## 2018-09-01 NOTE — Telephone Encounter (Signed)
Received records from [facility]

## 2018-09-03 LAB — COLOGUARD: Cologuard: NEGATIVE

## 2018-11-16 ENCOUNTER — Other Ambulatory Visit: Payer: Self-pay

## 2018-11-16 ENCOUNTER — Ambulatory Visit (INDEPENDENT_AMBULATORY_CARE_PROVIDER_SITE_OTHER): Admitting: Family Medicine

## 2018-11-16 ENCOUNTER — Encounter: Payer: Self-pay | Admitting: Family Medicine

## 2018-11-16 VITALS — BP 135/85 | HR 93 | Temp 93.0°F | Ht 62.0 in | Wt 225.0 lb

## 2018-11-16 DIAGNOSIS — R42 Dizziness and giddiness: Secondary | ICD-10-CM | POA: Diagnosis not present

## 2018-11-16 DIAGNOSIS — E7800 Pure hypercholesterolemia, unspecified: Secondary | ICD-10-CM | POA: Insufficient documentation

## 2018-11-16 DIAGNOSIS — H6122 Impacted cerumen, left ear: Secondary | ICD-10-CM | POA: Diagnosis not present

## 2018-11-16 MED ORDER — ROSUVASTATIN CALCIUM 5 MG PO TABS: 5.0000 mg | ORAL_TABLET | ORAL | 0 refills | Status: DC

## 2018-11-16 NOTE — Progress Notes (Signed)
3/2/20203:32 PM  Natalie Eaton 28-Aug-1956, 63 y.o. female [id number]  Chief Complaint  Patient presents with  . Dizziness    feeling dizzy for a few months now, went [facility] and was given meclizine that is not working. Not taking the bp meds for the past 3 weeks due to muscle pain. Twin sister has had this symptom in the past and was found to have Acute disseminated encephalomyelitis. Has concermed that this is what this is leading to    HPI:   Patient is a 63 y.o. female with past medical history significant for HTN, hypothryodism, pre-diabetes, back pain with sciatica and obesity who presents today for dizziness  Dizziness for couple months, room spinning When she bends over to pickup something or when she rolls over from side to side in bed She went [facility] told it was vertigo Was rx meclizine - not helping Denies any focal weakness, changes in speech or vision Denies any CP or SOB  Has not been tolerating lipitor due to muscle pain She has even tried 1/2 tab qod wo relief   Fall Risk  11/16/2018 08/21/2018 05/22/2018 04/08/2018 02/24/2018  Falls in the past year? 0 0 Yes No No  Number falls in past yr: 0 - 1 - -  Injury with Fall? 0 - No - -     Depression screen [facility] 2/9 11/16/2018 08/21/2018 05/22/2018  Decreased Interest 0 0 0  Down, Depressed, Hopeless 0 0 0  PHQ - 2 Score 0 0 0    Allergies  Allergen Reactions  . Codeine Other (See Comments)    Tingling on her scalp.    Prior to Admission medications   Medication Sig Start Date End Date Taking? Authorizing Provider  aspirin 81 MG chewable tablet Chew 81 mg by mouth daily.    Yes [provider]  celecoxib (CELEBREX) 200 MG capsule Take 1 capsule (200 mg total) by mouth 2 (two) times daily. 08/21/18  Yes Rutherford Guys, MD  estradiol (ESTRACE) 1 MG tablet Week 1 and 2: Take 1 tab PO x 6 days, then take 1/2 tab PO x 1 day;  Week 3 and 4: Take 1 tab PO x 5 days, then take 1/2 tab PO x 2  days Patient taking differently: Take 1 mg by mouth daily.  05/07/18  Yes Rutherford Guys, MD  gabapentin (NEURONTIN) 300 MG capsule Take 2 capsules (600 mg total) by mouth at bedtime. 08/21/18  Yes Rutherford Guys, MD  hydrochlorothiazide (HYDRODIURIL) 25 MG tablet Take 1 tablet (25 mg total) by mouth daily. 08/21/18  Yes Rutherford Guys, MD  levothyroxine (SYNTHROID, LEVOTHROID) 112 MCG tablet TAKE 1 TABLET BY MOUTH ONCE DAILY BEFORE BREAKFAST 08/28/18  Yes Rutherford Guys, MD  methocarbamol (ROBAXIN) 750 MG tablet Take 1 tablet (750 mg total) by mouth every 8 (eight) hours as needed for muscle spasms. 08/21/18  Yes Rutherford Guys, MD  Multiple Vitamins-Minerals (HAIR SKIN AND NAILS FORMULA PO) Take 1 tablet by mouth daily.   Yes [provider]  potassium chloride SA (K-DUR,KLOR-CON) 20 MEQ tablet Take 1 tablet (20 mEq total) by mouth daily. Takes two days a week. 08/21/18  Yes Rutherford Guys, MD  traZODone (DESYREL) 50 MG tablet Take 0.5-1 tablets (25-50 mg total) by mouth at bedtime as needed for sleep. 08/21/18  Yes Rutherford Guys, MD  valACYclovir (VALTREX) 500 MG tablet Take 500 mg by mouth daily. 06/01/18  Yes  [provider]  vitamin E 400 UNIT capsule Take 400 Units by mouth 2 (two) times daily.   Yes [provider]    Past Medical History:  Diagnosis Date  . Back pain   . Closed TBI (traumatic brain injury) ([place])   . Coronary artery disease   . Epilepsy ([place])   . History of hysterectomy   . 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 Hypertension   . Hypothyroidism   . Insomnia   . MVP (mitral valve prolapse)   . Pneumonia   . Pre-diabetes     Past Surgical History:  Procedure Laterality Date  . ABDOMINAL HYSTERECTOMY    . BLADDER SURGERY    . CARDIAC CATHETERIZATION    . GALLBLADDER SURGERY    . LUNG REMOVAL, PARTIAL    . ROTATOR CUFF REPAIR    . TONSILLECTOMY      Social History   Tobacco Use  .  Smoking status: Former Research scientist (life sciences)  . Smokeless tobacco: Never Used  Substance Use Topics  . Alcohol use: Yes    Alcohol/week: 1.0 standard drinks    Types: 1 Glasses of wine per week    Frequency: Never    Family History  Problem Relation Age of Onset  . Healthy Mother   . Heart disease Brother   . Healthy Daughter   . Healthy Son     ROS Per hpi  OBJECTIVE:  Blood pressure 135/85, pulse 93, temperature (!) 93 F (33.9 C), height 5\' 2"  (1.575 m), weight 225 lb (102.1 kg), SpO2 95 %. Body mass index is 41.15 kg/m.   Orthostatic vitals signs Lying: 136/82, HR 86 Standing @ 1 min: 128/82, 92 Standing @ 3 min: 135/85, 93  Physical Exam Vitals signs and nursing note reviewed.  Constitutional:      Appearance: She is well-developed.  HENT:     Head: Normocephalic and atraumatic.     Right Ear: Hearing, tympanic membrane, ear canal and external ear normal.     Left Ear: Hearing, tympanic membrane, ear canal and external ear normal. There is impacted cerumen.     Ears:     Comments: Left ear lavaged successfully by CMA Eyes:     Extraocular Movements:     Right eye: Nystagmus present.     Left eye: Nystagmus present.     Conjunctiva/sclera: Conjunctivae normal.     Pupils: Pupils are equal, round, and reactive to light.  Neck:     Musculoskeletal: Neck supple.  Cardiovascular:     Rate and Rhythm: Normal rate and regular rhythm.     Heart sounds: Normal heart sounds. No murmur. No friction rub. No gallop.   Pulmonary:     Effort: Pulmonary effort is normal.     Breath sounds: Normal breath sounds. No wheezing or rales.  Lymphadenopathy:     Cervical: No cervical adenopathy.  Skin:    General: Skin is warm and dry.  Neurological:     Mental Status: She is alert and oriented to person, place, and time.     Cranial Nerves: Cranial nerves are intact.     Sensory: Sensation is intact.     Motor: Motor function is intact. No pronator drift.     Coordination:  Coordination is intact. Romberg sign negative.     Gait: Gait is intact.     Deep Tendon Reflexes: Reflexes are normal and symmetric.      ASSESSMENT and  PLAN   1. Vertigo Reassuring neuro exam. Discussed patient's concerns re ADEM. Declines neuro referral at this time. Provided exercises to do at home, fall precautions reviewed. RTC precautions discussed - Orthostatic vital signs  2. Impacted cerumen of left ear - Ear wax removal - done successfully by CMA  3. Pure hypercholesterolemia Changing to crestor 5mg  twice a week, if tolerating discussed increasing slowly, goal is once a day  Other orders - rosuvastatin (CRESTOR) 5 MG tablet; Take 1 tablet (5 mg total) by mouth 2 (two) times a week.  Return in about 3 months (around 02/16/2019) for chronic medical conditions.    Rutherford Guys, MD Primary Care at [place], [place] [address] Ph.  [phone] Fax [phone]

## 2018-11-16 NOTE — Patient Instructions (Signed)
How to Perform the Epley Maneuver  The Epley maneuver is an exercise that relieves symptoms of vertigo. Vertigo is the feeling that you or your surroundings are moving when they are not. When you feel vertigo, you may feel like the room is spinning and have trouble walking. Dizziness is a little different than vertigo. When you are dizzy, you may feel unsteady or light-headed.  You can do this maneuver at home whenever you have symptoms of vertigo. You can do it up to 3 times a day until your symptoms go away.  Even though the Epley maneuver may relieve your vertigo for a few weeks, it is possible that your symptoms will return. This maneuver relieves vertigo, but it does not relieve dizziness.  What are the risks?  If it is done correctly, the Epley maneuver is considered safe. Sometimes it can lead to dizziness or nausea that goes away after a short time. If you develop other symptoms, such as changes in vision, weakness, or numbness, stop doing the maneuver and call your health care provider.  How to perform the Epley maneuver  1. Sit on the edge of a bed or table with your back straight and your legs extended or hanging over the edge of the bed or table.  2. Turn your head halfway toward the affected ear or side.  3. Lie backward quickly with your head turned until you are lying flat on your back. You may want to position a pillow under your shoulders.  4. Hold this position for 30 seconds. You may experience an attack of vertigo. This is normal.  5. Turn your head to the opposite direction until your unaffected ear is facing the floor.  6. Hold this position for 30 seconds. You may experience an attack of vertigo. This is normal. Hold this position until the vertigo stops.  7. Turn your whole body to the same side as your head. Hold for another 30 seconds.  8. Sit back up.  You can repeat this exercise up to 3 times a day.  Follow these instructions at home:   After doing the Epley maneuver, you can return to  your normal activities.   Ask your health care provider if there is anything you should do at home to prevent vertigo. He or she may recommend that you:  ? Keep your head raised (elevated) with two or more pillows while you sleep.  ? Do not sleep on the side of your affected ear.  ? Get up slowly from bed.  ? Avoid sudden movements during the day.  ? Avoid extreme head movement, like looking up or bending over.  Contact a health care provider if:   Your vertigo gets worse.   You have other symptoms, including:  ? Nausea.  ? Vomiting.  ? Headache.  Get help right away if:   You have vision changes.   You have a severe or worsening headache or neck pain.   You cannot stop vomiting.   You have new numbness or weakness in any part of your body.  Summary   Vertigo is the feeling that you or your surroundings are moving when they are not.   The Epley maneuver is an exercise that relieves symptoms of vertigo.   If the Epley maneuver is done correctly, it is considered safe. You can do it up to 3 times a day.  This information is not intended to replace advice given to you by your health care provider. Make sure   you discuss any questions you have with your health care provider.  Document Released: 09/07/2013 Document Revised: 07/23/2016 Document Reviewed: 07/23/2016  Elsevier Interactive Patient Education  2019 [employer]  \

## 2018-11-26 ENCOUNTER — Other Ambulatory Visit: Payer: Self-pay | Admitting: Family Medicine

## 2018-11-26 NOTE — Telephone Encounter (Signed)
rx

## 2018-11-26 NOTE — Telephone Encounter (Signed)
Requested Prescriptions  Pending Prescriptions Disp Refills  . levothyroxine (SYNTHROID, LEVOTHROID) 112 MCG tablet [Pharmacy Med Name: Levothyroxine Sodium 112 MCG Oral Tablet] 90 tablet 0    Sig: TAKE 1 TABLET BY MOUTH ONCE DAILY BEFORE BREAKFAST     Endocrinology:  Hypothyroid Agents Failed - 11/26/2018  8:31 AM      Failed - TSH needs to be rechecked within 3 months after an abnormal result. Refill until TSH is due.      Failed - TSH in normal range and within 360 days    TSH  Date Value Ref Range Status  08/21/2018 0.305 (L) 0.450 - 4.500 uIU/mL Final         Passed - Valid encounter within last 12 months    Recent Outpatient Visits          1 week ago Vertigo   [facility], Lilia Argue, MD   3 months ago Hypothyroidism, unspecified type   [facility], Lilia Argue, MD   6 months ago Chronic right-sided low back pain without sciatica   [facility] [place], [place] D, PA-C   7 months ago Acute right-sided low back pain without sciatica   [facility], [place] D, PA-C   9 months ago Essential hypertension, benign   [facility], Lilia Argue, MD      Future Appointments            In 2 months Rutherford Guys, MD [facility] [place], [facility]

## 2018-12-14 ENCOUNTER — Encounter: Payer: Self-pay | Admitting: Family Medicine

## 2019-02-11 ENCOUNTER — Other Ambulatory Visit: Payer: Self-pay | Admitting: Family Medicine

## 2019-02-11 NOTE — Telephone Encounter (Signed)
Requested medication (s) are due for refill today:  yes  Requested medication (s) are on the active medication list:  Yes  Future visit scheduled:  Yes  Last Refill: 11/26/18; #90; no refills  Note to clinic: attempted to call pt. To inform she is overdue for TSH lab; left message to call to schedule this.  Requested Prescriptions  Pending Prescriptions Disp Refills   levothyroxine (SYNTHROID) 112 MCG tablet [Pharmacy Med Name: Levothyroxine Sodium 112 MCG Oral Tablet] 90 tablet 0    Sig: TAKE 1 TABLET BY MOUTH ONCE DAILY BEFORE BREAKFAST     Endocrinology:  Hypothyroid Agents Failed - 02/11/2019  3:59 PM      Failed - TSH needs to be rechecked within 3 months after an abnormal result. Refill until TSH is due.      Failed - TSH in normal range and within 360 days    TSH  Date Value Ref Range Status  08/21/2018 0.305 (L) 0.450 - 4.500 uIU/mL Final         Passed - Valid encounter within last 12 months    Recent Outpatient Visits          2 months ago Vertigo   [facility], Lilia Argue, MD   5 months ago Hypothyroidism, unspecified type   [facility], Lilia Argue, MD   8 months ago Chronic right-sided low back pain without sciatica   [facility] [place], [place] D, PA-C   10 months ago Acute right-sided low back pain without sciatica   [facility] at [facility], [place] D, PA-C   11 months ago Essential hypertension, benign   [facility], Lilia Argue, MD      Future Appointments            In 6 days Rutherford Guys, MD [facility] at [place], [facility]

## 2019-02-12 ENCOUNTER — Other Ambulatory Visit: Payer: Self-pay

## 2019-02-12 ENCOUNTER — Ambulatory Visit (INDEPENDENT_AMBULATORY_CARE_PROVIDER_SITE_OTHER): Admitting: Family Medicine

## 2019-02-12 DIAGNOSIS — E039 Hypothyroidism, unspecified: Secondary | ICD-10-CM | POA: Diagnosis not present

## 2019-02-13 LAB — TSH: TSH: 0.802 u[IU]/mL (ref 0.450–4.500)

## 2019-02-17 ENCOUNTER — Encounter: Payer: Self-pay | Admitting: Family Medicine

## 2019-02-17 ENCOUNTER — Ambulatory Visit (INDEPENDENT_AMBULATORY_CARE_PROVIDER_SITE_OTHER): Admitting: Family Medicine

## 2019-02-17 ENCOUNTER — Other Ambulatory Visit: Payer: Self-pay | Admitting: Family Medicine

## 2019-02-17 ENCOUNTER — Other Ambulatory Visit: Payer: Self-pay

## 2019-02-17 ENCOUNTER — Telehealth: Payer: Self-pay | Admitting: Family Medicine

## 2019-02-17 VITALS — BP 132/76 | HR 71 | Temp 97.9°F | Ht 68.0 in | Wt 223.0 lb

## 2019-02-17 DIAGNOSIS — Z789 Other specified health status: Secondary | ICD-10-CM | POA: Diagnosis not present

## 2019-02-17 DIAGNOSIS — E039 Hypothyroidism, unspecified: Secondary | ICD-10-CM

## 2019-02-17 DIAGNOSIS — E7800 Pure hypercholesterolemia, unspecified: Secondary | ICD-10-CM | POA: Diagnosis not present

## 2019-02-17 DIAGNOSIS — I10 Essential (primary) hypertension: Secondary | ICD-10-CM | POA: Diagnosis not present

## 2019-02-17 DIAGNOSIS — R7303 Prediabetes: Secondary | ICD-10-CM

## 2019-02-17 DIAGNOSIS — M25552 Pain in left hip: Secondary | ICD-10-CM

## 2019-02-17 DIAGNOSIS — M25551 Pain in right hip: Secondary | ICD-10-CM

## 2019-02-17 MED ORDER — POTASSIUM CHLORIDE CRYS ER 10 MEQ PO TBCR: 10.0000 meq | EXTENDED_RELEASE_TABLET | Freq: Two times a day (BID) | ORAL | 1 refills | Status: DC

## 2019-02-17 MED ORDER — DICLOFENAC SODIUM 1 % TD GEL: 2.0000 g | Freq: Four times a day (QID) | TRANSDERMAL | 4 refills | Status: DC

## 2019-02-17 NOTE — Telephone Encounter (Signed)
Called pharmacy to clarify Rx, they verbalized understanding.

## 2019-02-17 NOTE — Progress Notes (Signed)
6/3/202010:56 AM  Natalie Eaton [birth year], 63 y.o., female [id number]  Chief Complaint  Patient presents with  . Hypothyroidism    talk about tsh lab  . Medication Reaction    not taking the crestor due to the pain it causes.Not taking the potsssium due to the size of the pill    HPI:   Patient is a 63 y.o. female with past medical history significant for HTN, hypothryodism, pre-diabetes,back pain with sciatica and obesity  who presents today for routine followup  Last OV March 2020  Taking her thyroid medication daily as prescribed Main concern is being tired, denies snoring Wakes up tired, denies waking up with headache or falling asleep easily during the day Poor sleep mostly related from hip pain Has had 2 steroid injections into right hip, has done PT, has been told that her back aggrevates  Did not tolerate crestor 5mg  twice a week Caused muscle pain which resolved when she stopped taking   Has been digging a root cellar Denies any CP, SOB, edema, palpitations, dizziness, lightheadness  Requesting potassium to be changed to 49meq as 35meq too big/unable to swallow  Wt Readings from Last 3 Encounters:  02/17/19 223 lb (101.2 kg)  11/16/18 225 lb (102.1 kg)  08/21/18 213 lb 3.2 oz (96.7 kg)    Lab Results  Component Value Date   TSH 0.802 02/12/2019   Lab Results  Component Value Date   CHOL 236 (H) 08/21/2018   HDL 40 08/21/2018   LDLCALC 139 (H) 08/21/2018   TRIG 285 (H) 08/21/2018   CHOLHDL 5.9 (H) 08/21/2018   Lab Results  Component Value Date   HGBA1C 5.8 (H) 08/21/2018    Fall Risk  02/17/2019 11/16/2018 08/21/2018 05/22/2018 04/08/2018  Falls in the past year? 0 0 0 Yes No  Number falls in past yr: 0 0 - 1 -  Injury with Fall? 0 0 - No -     Depression screen [facility] 2/9 02/17/2019 11/16/2018 08/21/2018  Decreased Interest 0 0 0  Down, Depressed, Hopeless 0 0 0  PHQ - 2 Score 0 0 0    Allergies  Allergen Reactions  . Codeine Other (See Comments)    Tingling on her scalp.    Prior to Admission medications   Medication Sig Start Date End Date Taking? Authorizing Provider  aspirin 81 MG chewable tablet Chew 81 mg by mouth daily.    Yes [provider]  celecoxib (CELEBREX) 200 MG capsule Take 1 capsule (200 mg total) by mouth 2 (two) times daily. 08/21/18  Yes Rutherford Guys, MD  estradiol (ESTRACE) 1 MG tablet Week 1 and 2: Take 1 tab PO x 6 days, then take 1/2 tab PO x 1 day;  Week 3 and 4: Take 1 tab PO x 5 days, then take 1/2 tab PO x 2 days Patient taking differently: Take 1 mg by mouth daily.  05/07/18  Yes Rutherford Guys, MD  gabapentin (NEURONTIN) 300 MG capsule Take 2 capsules (600 mg total) by mouth at bedtime. 08/21/18  Yes Rutherford Guys, MD  hydrochlorothiazide (HYDRODIURIL) 25 MG tablet Take 1 tablet (25 mg total) by mouth daily. 08/21/18  Yes Rutherford Guys, MD  levothyroxine (SYNTHROID, LEVOTHROID) 112 MCG tablet TAKE 1 TABLET BY MOUTH ONCE DAILY BEFORE BREAKFAST 11/26/18  Yes Rutherford Guys, MD  methocarbamol (ROBAXIN) 750 MG tablet Take 1 tablet (750 mg total) by mouth every 8 (eight) hours as needed for muscle spasms. 08/21/18  Yes Rutherford Guys, MD  Multiple Vitamins-Minerals (HAIR SKIN AND NAILS FORMULA PO) Take 1 tablet by mouth daily.   Yes [provider]  traZODone (DESYREL) 50 MG tablet Take 0.5-1 tablets (25-50 mg total) by mouth at bedtime as needed for sleep. 08/21/18  Yes Rutherford Guys, MD  valACYclovir (VALTREX) 500 MG tablet Take 500 mg by mouth daily. 06/01/18  Yes [provider]  vitamin E 400 UNIT capsule Take 400 Units by mouth 2 (two) times daily.   Yes [provider]  potassium chloride SA (K-DUR,KLOR-CON) 20 MEQ tablet Take 1 tablet (20 mEq total) by mouth daily. Takes two days a week. Patient not taking: Reported on 02/17/2019 08/21/18   Rutherford Guys, MD  rosuvastatin (CRESTOR) 5 MG tablet Take 1 tablet (5 mg total) by mouth 2 (two) times a week. Patient  not taking: Reported on 02/17/2019 11/16/18   Rutherford Guys, MD    Past Medical History:  Diagnosis Date  . Back pain   . Closed TBI (traumatic brain injury) ([place])   . Coronary artery disease   . Epilepsy ([place])   . History of hysterectomy   . 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 Hypertension   . Hypothyroidism   . Insomnia   . MVP (mitral valve prolapse)   . Pneumonia   . Pre-diabetes     Past Surgical History:  Procedure Laterality Date  . ABDOMINAL HYSTERECTOMY    . BLADDER SURGERY    . CARDIAC CATHETERIZATION    . GALLBLADDER SURGERY    . LUNG REMOVAL, PARTIAL    . ROTATOR CUFF REPAIR    . TONSILLECTOMY      Social History   Tobacco Use  . Smoking status: Former Research scientist (life sciences)  . Smokeless tobacco: Never Used  Substance Use Topics  . Alcohol use: Yes    Alcohol/week: 1.0 standard drinks    Types: 1 Glasses of wine per week    Frequency: Never    Family History  Problem Relation Age of Onset  . Healthy Mother   . Heart disease Brother   . Healthy Daughter   . Healthy Son     ROS Per hpi  OBJECTIVE:  Today's Vitals   02/17/19 0935  BP: 132/76  Pulse: 71  Temp: 97.9 F (36.6 C)  TempSrc: Oral  Weight: 223 lb (101.2 kg)  Height: 5\' 8"  (1.727 m)   Body mass index is 33.91 kg/m.   Physical Exam Vitals signs and nursing note reviewed.  Constitutional:      Appearance: She is well-developed.  HENT:     Head: Normocephalic and atraumatic.     Mouth/Throat:     Pharynx: No oropharyngeal exudate.  Eyes:     General: No scleral icterus.    Conjunctiva/sclera: Conjunctivae normal.     Pupils: Pupils are equal, round, and reactive to light.  Neck:     Musculoskeletal: Neck supple.  Cardiovascular:     Rate and Rhythm: Normal rate and regular rhythm.     Heart sounds: Normal heart sounds. No murmur. No friction rub. No gallop.   Pulmonary:     Effort: Pulmonary effort is normal.     Breath sounds: Normal breath  sounds. No wheezing or rales.  Skin:    General: Skin is warm and dry.  Neurological:     Mental Status: She is alert and oriented to person, place, and time.  ASSESSMENT and PLAN  1. Hypothyroidism, unspecified type Controlled. Continue current regime.  - TSH; Future  2. Statin intolerance 3. Pure hypercholesterolemia Has tried multiple statins all with same effect, focus on LFM. Consider referral to endo - Comprehensive metabolic panel; Future - Lipid panel; Future  4. Essential hypertension, benign Controlled. Continue current regime.   5. Pain of both hip joints Postponing surgery and further interventions until age 84 due to insurance. Trial of diclofenac gel.  6. Prediabetes Discussed LFM - Comprehensive metabolic panel; Future - Hemoglobin A1c; Future  Other orders - potassium chloride SA (K-DUR) 10 MEQ tablet; Take 1 tablet (10 mEq total) by mouth 2 (two) times daily. Takes two days a week. - diclofenac sodium (VOLTAREN) 1 % GEL; Apply 2 g topically 4 (four) times daily.  Return in about 6 months (around 08/19/2019) for fasting labs 3-4 days before appt.    Rutherford Guys, MD [facility] at [place], [place] [address] Ph.  [phone] Fax [phone]

## 2019-02-17 NOTE — Telephone Encounter (Signed)
Copied from [place] [id number]. Topic: General - Other >> Feb 17, 2019 12:33 PM Mcneil, Ja-Kwan wrote: Reason for CRM: Jonelle with [facility] called for clarification on the Rx for potassium chloride SA (K-DUR) 10 MEQ tablet. Jonelle requests call back. Cb# [phone]

## 2019-02-17 NOTE — Patient Instructions (Addendum)
If you have lab work done today you will be contacted with your lab results within the next 2 weeks.  If you have not heard from [place] then please contact us. The fastest way to get your results is to register for My Chart.   IF you received an x-ray today, you will receive an invoice from [facility]. Please contact [facility] at [phone] with questions or concerns regarding your invoice.   IF you received labwork today, you will receive an invoice from [place]. Please contact [facility] at [phone] with questions or concerns regarding your invoice.   Our billing staff will not be able to assist you with questions regarding bills from these companies.  You will be contacted with the lab results as soon as they are available. The fastest way to get your results is to activate your My Chart account. Instructions are located on the last page of this paperwork. If you have not heard from [place] regarding the results in 2 weeks, please contact this office.     Prediabetes Eating Plan Prediabetes is a condition that causes blood sugar (glucose) levels to be higher than normal. This increases the risk for developing diabetes. In order to prevent diabetes from developing, your health care provider may recommend a diet and other lifestyle changes to help you:  Control your blood glucose levels.  Improve your cholesterol levels.  Manage your blood pressure. Your health care provider may recommend working with a diet and nutrition specialist (dietitian) to make a meal plan that is best for you. What are tips for following this plan? Lifestyle  Set weight loss goals with the help of your health care team. It is recommended that most people with prediabetes lose 7% of their current body weight.  Exercise for at least 30 minutes at least 5 days a week.  Attend a support group or seek ongoing support from a mental health counselor.  Take over-the-counter and prescription  medicines only as told by your health care provider. Reading food labels  Read food labels to check the amount of fat, salt (sodium), and sugar in prepackaged foods. Avoid foods that have: ? Saturated fats. ? Trans fats. ? Added sugars.  Avoid foods that have more than 300 milligrams (mg) of sodium per serving. Limit your daily sodium intake to less than 2,300 mg each day. Shopping  Avoid buying pre-made and processed foods. Cooking  Cook with olive oil. Do not use butter, lard, or ghee.  Bake, broil, grill, or boil foods. Avoid frying. Meal planning   Work with your dietitian to develop an eating plan that is right for you. This may include: ? Tracking how many calories you take in. Use a food diary, notebook, or mobile application to track what you eat at each meal. ? Using the glycemic index (GI) to plan your meals. The index tells you how quickly a food will raise your blood glucose. Choose low-GI foods. These foods take a longer time to raise blood glucose.  Consider following a Mediterranean diet. This diet includes: ? Several servings each day of fresh fruits and vegetables. ? Eating fish at least twice a week. ? Several servings each day of whole grains, beans, nuts, and seeds. ? Using olive oil instead of other fats. ? Moderate alcohol consumption. ? Eating small amounts of red meat and whole-fat dairy.  If you have high blood pressure, you may need to limit your sodium intake or follow a diet such as the  DASH eating plan. DASH is an eating plan that aims to lower high blood pressure. What foods are recommended? The items listed below may not be a complete list. Talk with your dietitian about what dietary choices are best for you. Grains Whole grains, such as whole-wheat or whole-grain breads, crackers, cereals, and pasta. Unsweetened oatmeal. Bulgur. Barley. Quinoa. Brown rice. Corn or whole-wheat flour tortillas or taco shells. Vegetables Lettuce. Spinach. Peas.  Beets. Cauliflower. Cabbage. Broccoli. Carrots. Tomatoes. Squash. Eggplant. Herbs. Peppers. Onions. Cucumbers. Brussels sprouts. Fruits Berries. Bananas. Apples. Oranges. Grapes. Papaya. Mango. Pomegranate. Kiwi. Grapefruit. Cherries. Meats and other protein foods Seafood. Poultry without skin. Lean cuts of pork and beef. Tofu. Eggs. Nuts. Beans. Dairy Low-fat or fat-free dairy products, such as yogurt, cottage cheese, and cheese. Beverages Water. Tea. Coffee. Sugar-free or diet soda. Seltzer water. Lowfat or no-fat milk. Milk alternatives, such as soy or almond milk. Fats and oils Olive oil. Canola oil. Sunflower oil. Grapeseed oil. Avocado. Walnuts. Sweets and desserts Sugar-free or low-fat pudding. Sugar-free or low-fat ice cream and other frozen treats. Seasoning and other foods Herbs. Sodium-free spices. Mustard. Relish. Low-fat, low-sugar ketchup. Low-fat, low-sugar barbecue sauce. Low-fat or fat-free mayonnaise. What foods are not recommended? The items listed below may not be a complete list. Talk with your dietitian about what dietary choices are best for you. Grains Refined white flour and flour products, such as bread, pasta, snack foods, and cereals. Vegetables Canned vegetables. Frozen vegetables with butter or cream sauce. Fruits Fruits canned with syrup. Meats and other protein foods Fatty cuts of meat. Poultry with skin. Breaded or fried meat. Processed meats. Dairy Full-fat yogurt, cheese, or milk. Beverages Sweetened drinks, such as sweet iced tea and soda. Fats and oils Butter. Lard. Ghee. Sweets and desserts Baked goods, such as cake, cupcakes, pastries, cookies, and cheesecake. Seasoning and other foods Spice mixes with added salt. Ketchup. Barbecue sauce. Mayonnaise. Summary  To prevent diabetes from developing, you may need to make diet and other lifestyle changes to help control blood sugar, improve cholesterol levels, and manage your blood  pressure.  Set weight loss goals with the help of your health care team. It is recommended that most people with prediabetes lose 7 percent of their current body weight.  Consider following a Mediterranean diet that includes plenty of fresh fruits and vegetables, whole grains, beans, nuts, seeds, fish, lean meat, low-fat dairy, and healthy oils. This information is not intended to replace advice given to you by your health care provider. Make sure you discuss any questions you have with your health care provider. Document Released: 01/17/2015 Document Revised: 11/06/2016 Document Reviewed: 11/06/2016 Elsevier Interactive Patient Education  2019 Elsevier Inc.  Preventing High Cholesterol Cholesterol is a waxy, fat-like substance that your body needs in small amounts. Your liver makes all the cholesterol that your body needs. Having high cholesterol (hypercholesterolemia) increases your risk for heart disease and stroke. Extra (excess) cholesterol comes from the food you eat, such as animal-based fat (saturated fat) from meat and some dairy products. High cholesterol can often be prevented with diet and lifestyle changes. If you already have high cholesterol, you can control it with diet and lifestyle changes, as well as medicine. What nutrition changes can be made?  Eat less saturated fat. Foods that contain saturated fat include red meat and some dairy products.  Avoid processed meats, like bacon and lunch meats.  Avoid trans fats, which are found in margarine and some baked goods.  Avoid foods and beverages that  have added sugars.  Eat more fruits, vegetables, and whole grains.  Choose healthy sources of protein, such as fish, poultry, and nuts.  Choose healthy sources of fat, such as: ? Nuts. ? Vegetable oils, especially olive oil. ? Fish that have healthy fats (omega-3 fatty acids), such as mackerel or salmon. What lifestyle changes can be made?   Lose weight if you are  overweight. Losing 5-10 lb (2.3-4.5 kg) can help prevent or control high cholesterol and reduce your risk for diabetes and high blood pressure. Ask your health care provider to help you with a diet and exercise plan to safely lose weight.  Get enough exercise. Do at least 150 minutes of moderate-intensity exercise each week. ? You could do this in short exercise sessions several times a day, or you could do longer exercise sessions a few times a week. For example, you could take a brisk 10-minute walk or bike ride, 3 times a day, for 5 days a week.  Do not smoke. If you need help quitting, ask your health care provider.  Limit your alcohol intake. If you drink alcohol, limit alcohol intake to no more than 1 drink a day for nonpregnant women and 2 drinks a day for men. One drink equals 12 oz of beer, 5 oz of wine, or 1 oz of hard liquor. Why are these changes important?  If you have high cholesterol, deposits (plaques) may build up on the walls of your blood vessels. Plaques make the arteries narrower and stiffer, which can restrict or block blood flow and cause blood clots to form. This greatly increases your risk for heart attack and stroke. Making diet and lifestyle changes can reduce your risk for these life-threatening conditions. What can I do to lower my risk?  Manage your risk factors for high cholesterol. Talk with your health care provider about all of your risk factors and how to lower your risk.  Manage other conditions that you have, such as diabetes or high blood pressure (hypertension).  Have your cholesterol checked at regular intervals.  Keep all follow-up visits as told by your health care provider. This is important. How is this treated? In addition to diet and lifestyle changes, your health care provider may recommend medicines to help lower cholesterol, such as a medicine to reduce the amount of cholesterol made in your liver. You may need medicine if:  Diet and lifestyle  changes do not lower your cholesterol enough.  You have high cholesterol and other risk factors for heart disease or stroke. Take over-the-counter and prescription medicines only as told by your health care provider. Where to find more information  American Heart Association: ThisTune.com.pt.jsp  National Heart, Lung, and Blood Institute: FrenchToiletries.com.cy Summary  High cholesterol increases your risk for heart disease and stroke. By keeping your cholesterol level low, you can reduce your risk for these conditions.  Diet and lifestyle changes are the most important steps in preventing high cholesterol.  Work with your health care provider to manage your risk factors, and have your blood tested regularly. This information is not intended to replace advice given to you by your health care provider. Make sure you discuss any questions you have with your health care provider. Document Released: 09/17/2015 Document Revised: 05/11/2016 Document Reviewed: 05/11/2016 Elsevier Interactive Patient Education  2019 [employer].

## 2019-02-22 ENCOUNTER — Other Ambulatory Visit: Payer: Self-pay | Admitting: Family Medicine

## 2019-02-22 NOTE — Telephone Encounter (Signed)
Requested medication (s) are due for refill today: Yes  Requested medication (s) are on the active medication list: Yes  Last refill: 11/26/18  Future visit scheduled: No  Notes to clinic:  Will need an appointment for TSH level, left VM.     Requested Prescriptions  Pending Prescriptions Disp Refills   levothyroxine (SYNTHROID) 112 MCG tablet [Pharmacy Med Name: Levothyroxine Sodium 112 MCG Oral Tablet] 90 tablet 0    Sig: TAKE 1 TABLET BY MOUTH ONCE DAILY BEFORE BREAKFAST     Endocrinology:  Hypothyroid Agents Failed - 02/22/2019  1:24 PM      Failed - TSH needs to be rechecked within 3 months after an abnormal result. Refill until TSH is due.      Passed - TSH in normal range and within 360 days    TSH  Date Value Ref Range Status  02/12/2019 0.802 0.450 - 4.500 uIU/mL Final         Passed - Valid encounter within last 12 months    Recent Outpatient Visits          5 days ago Hypothyroidism, unspecified type   [facility], Lilia Argue, MD   1 week ago Hypothyroidism, unspecified type   [facility], Lilia Argue, MD   3 months ago Vertigo   [facility], Lilia Argue, MD   6 months ago Hypothyroidism, unspecified type   [facility], Lilia Argue, MD   9 months ago Chronic right-sided low back pain without sciatica   [facility] at [facility], [place] [address], [place]

## 2019-02-22 NOTE — Telephone Encounter (Signed)
Patient called on home and cell listed, left VM to return call to the office to schedule a TSH level. Noted to return in 6-8 weeks for TSH level due to decrease in dosage.

## 2019-02-25 ENCOUNTER — Other Ambulatory Visit: Payer: Self-pay | Admitting: Family Medicine

## 2019-03-22 ENCOUNTER — Encounter: Payer: Self-pay | Admitting: Family Medicine

## 2019-03-22 ENCOUNTER — Other Ambulatory Visit: Payer: Self-pay | Admitting: Obstetrics and Gynecology

## 2019-03-22 DIAGNOSIS — Z1231 Encounter for screening mammogram for malignant neoplasm of breast: Secondary | ICD-10-CM

## 2019-05-06 ENCOUNTER — Ambulatory Visit
Admission: RE | Admit: 2019-05-06 | Discharge: 2019-05-06 | Disposition: A | Discharge status: Home / Self Care | Source: Ambulatory Visit | Attending: Obstetrics and Gynecology | Admitting: Obstetrics and Gynecology

## 2019-05-06 ENCOUNTER — Other Ambulatory Visit: Payer: Self-pay

## 2019-05-06 DIAGNOSIS — Z1231 Encounter for screening mammogram for malignant neoplasm of breast: Secondary | ICD-10-CM

## 2019-05-25 ENCOUNTER — Other Ambulatory Visit: Payer: Self-pay | Admitting: Family Medicine

## 2019-06-01 ENCOUNTER — Other Ambulatory Visit: Payer: Self-pay | Admitting: Family Medicine

## 2019-06-05 ENCOUNTER — Encounter: Payer: Self-pay | Admitting: Family Medicine

## 2019-06-10 ENCOUNTER — Ambulatory Visit (INDEPENDENT_AMBULATORY_CARE_PROVIDER_SITE_OTHER): Admitting: Family Medicine

## 2019-06-10 ENCOUNTER — Other Ambulatory Visit: Payer: Self-pay

## 2019-06-10 ENCOUNTER — Encounter: Payer: Self-pay | Admitting: Family Medicine

## 2019-06-10 VITALS — BP 128/83 | HR 75 | Temp 98.6°F | Ht 68.0 in | Wt 222.2 lb

## 2019-06-10 DIAGNOSIS — E7800 Pure hypercholesterolemia, unspecified: Secondary | ICD-10-CM

## 2019-06-10 DIAGNOSIS — R7303 Prediabetes: Secondary | ICD-10-CM

## 2019-06-10 DIAGNOSIS — I341 Nonrheumatic mitral (valve) prolapse: Secondary | ICD-10-CM | POA: Insufficient documentation

## 2019-06-10 DIAGNOSIS — Z23 Encounter for immunization: Secondary | ICD-10-CM

## 2019-06-10 DIAGNOSIS — R3915 Urgency of urination: Secondary | ICD-10-CM | POA: Diagnosis not present

## 2019-06-10 DIAGNOSIS — D72829 Elevated white blood cell count, unspecified: Secondary | ICD-10-CM

## 2019-06-10 DIAGNOSIS — E039 Hypothyroidism, unspecified: Secondary | ICD-10-CM | POA: Diagnosis not present

## 2019-06-10 DIAGNOSIS — R232 Flushing: Secondary | ICD-10-CM

## 2019-06-10 DIAGNOSIS — R5383 Other fatigue: Secondary | ICD-10-CM | POA: Diagnosis not present

## 2019-06-10 DIAGNOSIS — M1990 Unspecified osteoarthritis, unspecified site: Secondary | ICD-10-CM | POA: Insufficient documentation

## 2019-06-10 LAB — CBC
Hematocrit: 44.1 % (ref 34.0–46.6)
Hemoglobin: 14.1 g/dL (ref 11.1–15.9)
MCH: 30.3 pg (ref 26.6–33.0)
MCHC: 32 g/dL (ref 31.5–35.7)
MCV: 95 fL (ref 79–97)
Platelets: 271 10*3/uL (ref 150–450)
RBC: 4.66 x10E6/uL (ref 3.77–5.28)
RDW: 13 % (ref 11.7–15.4)
WBC: 13.9 10*3/uL — ABNORMAL HIGH (ref 3.4–10.8)

## 2019-06-10 LAB — POCT URINALYSIS DIP (MANUAL ENTRY)
Bilirubin, UA: NEGATIVE
Blood, UA: NEGATIVE
Glucose, UA: NEGATIVE mg/dL
Ketones, POC UA: NEGATIVE mg/dL
Leukocytes, UA: NEGATIVE
Nitrite, UA: NEGATIVE
Protein Ur, POC: NEGATIVE mg/dL
Spec Grav, UA: 1.025 (ref 1.010–1.025)
Urobilinogen, UA: 0.2 E.U./dL
pH, UA: 6 (ref 5.0–8.0)

## 2019-06-10 MED ORDER — CYCLOBENZAPRINE HCL 10 MG PO TABS: 10.0000 mg | ORAL_TABLET | Freq: Three times a day (TID) | ORAL | 0 refills | Status: DC | PRN

## 2019-06-10 NOTE — Progress Notes (Signed)
9/24/202010:10 AM  Natalie Eaton Aug 07, 1956, 63 y.o., female [id number]  Chief Complaint  Patient presents with  . Follow-up    tsh, htn. Has concerns of weight gain and hot flashes and urinary urgency    HPI:   Patient is a 63 y.o. female with past medical history significant for HTN, hypothryodism,pre-diabetes,back pain with sciatica and obesity who presents today for routine followup  Last OV June 2020 - no changes Has been having suprapubic pressure with very little output x 2-3 weeks, no dysuria, no hematuria Tired, fatigued, no fever, chills, night sweats, cp, sob, palpitations Having pain in her thighs only at night - feels tight.cramps, has not tried taking methocarbamol Having hot flashes x 3-4 weeks, went thru menopause ~ 10 yr ago She is wondering if low sugars, would like glucometer Reports normal appetite, eats lots of protein Denies any abd pain, nausea, vomiting, changes in BM, no black tarry stools or blood in stools  There are no preventive care reminders to display for this patient.    Lab Results  Component Value Date   TSH 0.802 02/12/2019   Wt Readings from Last 3 Encounters:  06/10/19 222 lb 3.2 oz (100.8 kg)  02/17/19 223 lb (101.2 kg)  11/16/18 225 lb (102.1 kg)   BP Readings from Last 3 Encounters:  06/10/19 128/83  02/17/19 132/76  11/16/18 135/85   Lab Results  Component Value Date   HGBA1C 5.8 (H) 08/21/2018    Depression screen PHQ 2/9 06/10/2019 02/17/2019 11/16/2018  Decreased Interest 0 0 0  Down, Depressed, Hopeless 0 0 0  PHQ - 2 Score 0 0 0    Fall Risk  06/10/2019 02/17/2019 11/16/2018 08/21/2018 05/22/2018  Falls in the past year? 0 0 0 0 Yes  Number falls in past yr: 0 0 0 - 1  Injury with Fall? 0 0 0 - No     Allergies  Allergen Reactions  . Codeine Other (See Comments)    Tingling on her scalp.    Prior to Admission medications   Medication Sig Start Date End Date Taking? Authorizing Provider  aspirin 81 MG chewable  tablet Chew 81 mg by mouth daily.    Yes [provider]  celecoxib (CELEBREX) 200 MG capsule Take 1 capsule by mouth twice daily 05/25/19  Yes Rutherford Guys, MD  diclofenac sodium (VOLTAREN) 1 % GEL Apply 2 g topically 4 (four) times daily. 02/17/19  Yes Rutherford Guys, MD  EUTHYROX 112 MCG tablet TAKE 1 TABLET BY MOUTH ONCE DAILY BEFORE BREAKFAST 06/01/19  Yes Rutherford Guys, MD  gabapentin (NEURONTIN) 300 MG capsule TAKE 2 CAPSULES BY MOUTH AT BEDTIME 02/25/19  Yes Rutherford Guys, MD  hydrochlorothiazide (HYDRODIURIL) 25 MG tablet Take 1 tablet (25 mg total) by mouth daily. 08/21/18  Yes Rutherford Guys, MD  methocarbamol (ROBAXIN) 750 MG tablet Take 1 tablet (750 mg total) by mouth every 8 (eight) hours as needed for muscle spasms. 08/21/18  Yes Rutherford Guys, MD  Multiple Vitamins-Minerals (HAIR SKIN AND NAILS FORMULA PO) Take 1 tablet by mouth daily.   Yes [provider]  potassium chloride (K-DUR) 10 MEQ tablet Take 1 tablet (10 mEq total) by mouth 2 (two) times daily. 02/17/19  Yes Rutherford Guys, MD  traZODone (DESYREL) 50 MG tablet Take 0.5-1 tablets (25-50 mg total) by mouth at bedtime as needed for sleep. 08/21/18  Yes Rutherford Guys, MD  valACYclovir (VALTREX) 500 MG tablet Take 500 mg  by mouth daily. 06/01/18  Yes [provider]  vitamin E 400 UNIT capsule Take 400 Units by mouth 2 (two) times daily.   Yes [provider]    Past Medical History:  Diagnosis Date  . Back pain   . Closed TBI (traumatic brain injury) ([place])   . Coronary artery disease   . Epilepsy ([place])   . History of hysterectomy   . 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 Hypertension   . Hypothyroidism   . Insomnia   . MVP (mitral valve prolapse)   . Pneumonia   . Pre-diabetes     Past Surgical History:  Procedure Laterality Date  . ABDOMINAL HYSTERECTOMY    . BLADDER SURGERY    . CARDIAC CATHETERIZATION    . GALLBLADDER  SURGERY    . LUNG REMOVAL, PARTIAL    . ROTATOR CUFF REPAIR    . TONSILLECTOMY      Social History   Tobacco Use  . Smoking status: Former Research scientist (life sciences)  . Smokeless tobacco: Never Used  Substance Use Topics  . Alcohol use: Yes    Alcohol/week: 1.0 standard drinks    Types: 1 Glasses of wine per week    Frequency: Never    Family History  Problem Relation Age of Onset  . Healthy Mother   . Heart disease Brother   . Healthy Daughter   . Healthy Son     ROS Per hpi  OBJECTIVE:  Today's Vitals   06/10/19 0926  BP: 128/83  Pulse: 75  Temp: 98.6 F (37 C)  SpO2: 96%  Weight: 222 lb 3.2 oz (100.8 kg)  Height: 5\' 8"  (1.727 m)   Body mass index is 33.79 kg/m.   Physical Exam Vitals signs and nursing note reviewed.  Constitutional:      Appearance: She is well-developed.  HENT:     Head: Normocephalic and atraumatic.     Right Ear: Hearing, tympanic membrane, ear canal and external ear normal.     Left Ear: Hearing, tympanic membrane, ear canal and external ear normal.     Mouth/Throat:     Mouth: Mucous membranes are moist.     Pharynx: No oropharyngeal exudate or posterior oropharyngeal erythema.  Eyes:     Extraocular Movements: Extraocular movements intact.     Conjunctiva/sclera: Conjunctivae normal.     Pupils: Pupils are equal, round, and reactive to light.  Neck:     Musculoskeletal: Neck supple.     Thyroid: No thyromegaly.  Cardiovascular:     Rate and Rhythm: Normal rate and regular rhythm.     Heart sounds: Normal heart sounds. No murmur. No friction rub. No gallop.   Pulmonary:     Effort: Pulmonary effort is normal.     Breath sounds: Normal breath sounds. No wheezing, rhonchi or rales.  Abdominal:     General: Bowel sounds are normal. There is no distension.     Palpations: Abdomen is soft. There is no hepatomegaly, splenomegaly or mass.     Tenderness: There is no abdominal tenderness.  Musculoskeletal: Normal range of motion.     Right lower  leg: No edema.     Left lower leg: No edema.     Comments: BLE FROM, normal strength andreflexes Left hip GT and IT band TTP    Lymphadenopathy:     Cervical: No cervical adenopathy.  Skin:    General: Skin is warm and dry.  Neurological:  Mental Status: She is alert and oriented to person, place, and time.     Cranial Nerves: No cranial nerve deficit.     Gait: Gait normal.     Deep Tendon Reflexes: Reflexes are normal and symmetric.  Psychiatric:        Mood and Affect: Mood normal.        Behavior: Behavior normal.     Results for orders placed or performed in visit on 06/10/19 (from the past 24 hour(s))  POCT urinalysis dipstick     Status: None   Collection Time: 06/10/19  9:44 AM  Result Value Ref Range   Color, UA yellow yellow   Clarity, UA clear clear   Glucose, UA negative negative mg/dL   Bilirubin, UA negative negative   Ketones, POC UA negative negative mg/dL   Spec Grav, UA 1.025 1.010 - 1.025   Blood, UA negative negative   pH, UA 6.0 5.0 - 8.0   Protein Ur, POC negative negative mg/dL   Urobilinogen, UA 0.2 0.2 or 1.0 E.U./dL   Nitrite, UA Negative Negative   Leukocytes, UA Negative Negative    No results found.   ASSESSMENT and PLAN  1. Fatigue, unspecified type Labs pending, might be related to poor sleep from pain and hot flashes. Discussed use of methocarbamol at bedtime, provided guidance for LE strecthing - CBC  2. Hot flashes Glucometer sample given, check cbgs with sx. Otherwise tsh pending. Consider further eval as needed.   3. Urinary urgency UA normal. Checking ucx. Push fluids.  - POCT urinalysis dipstick - Urine Culture  4. Hypothyroidism, unspecified type Checking labs today, medications will be adjusted as needed.  - TSH  5. Prediabetes Labs pending. Cont with LFM - Hemoglobin A1c - Comprehensive metabolic panel  6. Pure hypercholesterolemia Checking labs today, medications will be adjusted as needed.  - Lipid panel  - Comprehensive metabolic panel  7. Need for vaccination - Flu Vaccine QUAD 36+ mos IM  Return in about 4 weeks (around 07/08/2019).    Rutherford Guys, MD [facility] at [facility] [place], [place] [address] Ph.  [phone] Fax [phone]

## 2019-06-10 NOTE — Patient Instructions (Signed)
° ° ° °  If you have lab work done today you will be contacted with your lab results within the next 2 weeks.  If you have not heard from us then please contact us. The fastest way to get your results is to register for My Chart. ° ° °IF you received an x-ray today, you will receive an invoice from [facility]. Please contact [facility] at [phone] with questions or concerns regarding your invoice.  ° °IF you received labwork today, you will receive an invoice from [facility]. Please contact [facility] at [phone] with questions or concerns regarding your invoice.  ° °Our billing staff will not be able to assist you with questions regarding bills from these companies. ° °You will be contacted with the lab results as soon as they are available. The fastest way to get your results is to activate your My Chart account. Instructions are located on the last page of this paperwork. If you have not heard from us regarding the results in 2 weeks, please contact this office. °  ° ° ° °

## 2019-06-11 ENCOUNTER — Encounter: Payer: Self-pay | Admitting: Family Medicine

## 2019-06-11 ENCOUNTER — Telehealth: Payer: Self-pay | Admitting: Family Medicine

## 2019-06-11 LAB — COMPREHENSIVE METABOLIC PANEL
ALT: 36 IU/L — ABNORMAL HIGH (ref 0–32)
AST: 24 IU/L (ref 0–40)
Albumin/Globulin Ratio: 2 (ref 1.2–2.2)
Albumin: 4.6 g/dL (ref 3.8–4.8)
Alkaline Phosphatase: 98 IU/L (ref 39–117)
BUN/Creatinine Ratio: 31 — ABNORMAL HIGH (ref 12–28)
BUN: 25 mg/dL (ref 8–27)
Bilirubin Total: 0.3 mg/dL (ref 0.0–1.2)
CO2: 26 mmol/L (ref 20–29)
Calcium: 10.5 mg/dL — ABNORMAL HIGH (ref 8.7–10.3)
Chloride: 100 mmol/L (ref 96–106)
Creatinine, Ser: 0.8 mg/dL (ref 0.57–1.00)
GFR calc Af Amer: 90 mL/min/{1.73_m2} (ref >59)
GFR calc non Af Amer: 78 mL/min/{1.73_m2} (ref >59)
Globulin, Total: 2.3 g/dL (ref 1.5–4.5)
Glucose: 86 mg/dL (ref 65–99)
Potassium: 4.5 mmol/L (ref 3.5–5.2)
Sodium: 139 mmol/L (ref 134–144)
Total Protein: 6.9 g/dL (ref 6.0–8.5)

## 2019-06-11 LAB — LIPID PANEL
Chol/HDL Ratio: 7.4 ratio — ABNORMAL HIGH (ref 0.0–4.4)
Cholesterol, Total: 295 mg/dL — ABNORMAL HIGH (ref 100–199)
HDL: 40 mg/dL (ref >39)
LDL Chol Calc (NIH): 196 mg/dL — ABNORMAL HIGH (ref 0–99)
Triglycerides: 297 mg/dL — ABNORMAL HIGH (ref 0–149)
VLDL Cholesterol Cal: 59 mg/dL — ABNORMAL HIGH (ref 5–40)

## 2019-06-11 LAB — URINE CULTURE

## 2019-06-11 LAB — HEMOGLOBIN A1C
Est. average glucose Bld gHb Est-mCnc: 123 mg/dL
Hgb A1c MFr Bld: 5.9 % — ABNORMAL HIGH (ref 4.8–5.6)

## 2019-06-11 LAB — TSH: TSH: 2.07 u[IU]/mL (ref 0.450–4.500)

## 2019-06-11 NOTE — Telephone Encounter (Signed)
Pt's blood sugar level was 177 last night 04/09/19. This morning 91, after breakfast 108. FYI

## 2019-06-15 MED ORDER — ATORVASTATIN CALCIUM 40 MG PO TABS: 40.0000 mg | ORAL_TABLET | Freq: Every day | ORAL | 3 refills | Status: DC

## 2019-06-15 NOTE — Addendum Note (Signed)
Addended by: Rutherford Guys on: 06/15/2019 01:39 PM   Modules accepted: Orders

## 2019-06-30 ENCOUNTER — Encounter: Payer: Self-pay | Admitting: Family Medicine

## 2019-07-08 ENCOUNTER — Ambulatory Visit: Admitting: Family Medicine

## 2019-07-19 ENCOUNTER — Ambulatory Visit (INDEPENDENT_AMBULATORY_CARE_PROVIDER_SITE_OTHER): Admitting: Family Medicine

## 2019-07-19 ENCOUNTER — Other Ambulatory Visit: Payer: Self-pay

## 2019-07-19 ENCOUNTER — Encounter: Payer: Self-pay | Admitting: Family Medicine

## 2019-07-19 VITALS — BP 138/83 | HR 91 | Temp 98.6°F | Wt 215.6 lb

## 2019-07-19 DIAGNOSIS — M5442 Lumbago with sciatica, left side: Secondary | ICD-10-CM

## 2019-07-19 DIAGNOSIS — M5137 Other intervertebral disc degeneration, lumbosacral region: Secondary | ICD-10-CM

## 2019-07-19 DIAGNOSIS — E039 Hypothyroidism, unspecified: Secondary | ICD-10-CM

## 2019-07-19 DIAGNOSIS — D72829 Elevated white blood cell count, unspecified: Secondary | ICD-10-CM | POA: Diagnosis not present

## 2019-07-19 DIAGNOSIS — M791 Myalgia, unspecified site: Secondary | ICD-10-CM

## 2019-07-19 DIAGNOSIS — M25449 Effusion, unspecified hand: Secondary | ICD-10-CM

## 2019-07-19 DIAGNOSIS — R232 Flushing: Secondary | ICD-10-CM | POA: Diagnosis not present

## 2019-07-19 DIAGNOSIS — M5441 Lumbago with sciatica, right side: Secondary | ICD-10-CM

## 2019-07-19 NOTE — Patient Instructions (Addendum)
I will check some labs for the symptoms we discussed today.  I referred you to back specialist to decide on physical therapy or other treatments for your back pain and sciatica.  Ultimately would like to see you on lower dose of Celebrex if possible..  I will check some other rheumatoid/arthritis tests and can discuss these results as well as blood counts and other tests at next visit.  Thank you for coming in today. Return to the clinic or go to the nearest emergency room if any of your symptoms worsen or new symptoms occur.     If you have lab work done today you will be contacted with your lab results within the next 2 weeks.  If you have not heard from [place] then please contact us. The fastest way to get your results is to register for My Chart.   IF you received an x-ray today, you will receive an invoice from [facility]. Please contact [facility] at [phone] with questions or concerns regarding your invoice.   IF you received labwork today, you will receive an invoice from [place]. Please contact [facility] at [phone] with questions or concerns regarding your invoice.   Our billing staff will not be able to assist you with questions regarding bills from these companies.  You will be contacted with the lab results as soon as they are available. The fastest way to get your results is to activate your My Chart account. Instructions are located on the last page of this paperwork. If you have not heard from [place] regarding the results in 2 weeks, please contact this office.

## 2019-07-19 NOTE — Progress Notes (Signed)
Subjective:    Patient ID: Natalie Eaton, female    DOB: 30-Mar-1956, 63 y.o.   MRN: [id number]  HPI Natalie Eaton is a 63 y.o. female Presents today for: Chief Complaint  Patient presents with   Establish Care    pain in both legs. Was taking celebrex and do not seems to working anymore.   Excessive Sweating    not sure if the sweating is from the medication i have been on.   Here for concerns above.  New patient to me - has been under care of Dr. Pamella Pert with last visit 9/24.  History of hypothyroidism, prediabetes, hypertension, back pain, sciatica, obesity.   Cramps in thighs at prior visit.  Has methocarbamol - taking 2-3 per day. Severe pain at times.  Initial pain in quads/front about a month ago. Now mostly in back of legs - radiates down both and notes more at night. Soreness in hips at night.  Has pinched nerve in back - sciatica of both sides. Planned "shaving of vertebrae". No recent PT for back.  No bowel or bladder incontinence, no saddle anesthesia, no lower extremity weakness.  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  Back and hands. On celebrex for years. On twice per day celebrex since last fall.  05/2018 LS spine:Diffuse multilevel degenerative change with lumbar scoliosis concave right. No acute bony abnormality.  Excessive sweating: Discussed hot flashes September 24.  Glucometer sample given to check CBGs with symptoms.  Softener September 25 glucose 177, then 91 fasting.  Glucose 177 during her hot flash.  Other times of hot flushes sugar reading 97, normal.  Lab Results  Component Value Date   HGBA1C 5.9 (H) 06/10/2019  Has a history of hypothyroidism, reportedly had been on Armourthyroid when in [place]. changed to Synthroid locally.on euthyrox 156mcg.  Lab Results  Component Value Date   TSH 2.070 06/10/2019  Leukocytosis 13.917, plan for pathology smear.  Calcium elevated 10.5, plan for PTH and vitamin D. Sweating in am or pm - no set  pattern.shirt, shins.  Healthy eating past month - lost 7# intentionally.  No fever.    Review of Systems Per HPI.    Objective:   Physical Exam Constitutional:      General: She is not in acute distress.    Appearance: She is well-developed.  HENT:     Head: Normocephalic and atraumatic.  Cardiovascular:     Rate and Rhythm: Normal rate.  Pulmonary:     Effort: Pulmonary effort is normal.  Musculoskeletal:     Lumbar back: She exhibits normal range of motion, no tenderness, no bony tenderness and no swelling.       Hands:     Comments: Negative seated straight leg raise.  Slight difficulty with toe walking but strength with heel walking.  Approximately 1+ reflexes patellar, Achilles bilaterally.  Muscles nontender, soft.   Neurological:     Mental Status: She is alert and oriented to person, place, and time.        Assessment & Plan:  Natalie Eaton is a 63 y.o. female Hot flashes - Plan: CBC Hypothyroidism, unspecified type Leukocytosis, unspecified type - Plan: CBC, Pathologist smear review  -Initially will repeat CBC with prior leukocytosis as well as path smear review.  Recent TSH appeared normal.  Further testing to determined by above results  Hypercalcemia - Plan: PTH, Intact and Calcium, Vitamin D, 25-hydroxy   Swelling of joint of hand, unspecified laterality -  Plan: Sedimentation Rate, Rheumatoid factor  -Appears to have prominent DIP joints, will initially screen with sed rate, rheumatoid factor, consider rheumatology eval.  Low back pain with bilateral sciatica, unspecified back pain laterality, unspecified chronicity - Plan: Sedimentation Rate, Rheumatoid factor, Ambulatory referral to Spine Surgery Myalgia - Plan: CK Degeneration of lumbar or lumbosacral intervertebral disc  -Degenerative disc disease likely contributing to radicular pain in legs but will also check CPK with myalgias.  Recommended evaluation with back specialist to determine next step,  specifically PT or injection versus advanced imaging.   No orders of the defined types were placed in this encounter.  Patient Instructions   I will check some labs for the symptoms we discussed today.  I referred you to back specialist to decide on physical therapy or other treatments for your back pain and sciatica.  Ultimately would like to see you on lower dose of Celebrex if possible..  I will check some other rheumatoid/arthritis tests and can discuss these results as well as blood counts and other tests at next visit.  Thank you for coming in today. Return to the clinic or go to the nearest emergency room if any of your symptoms worsen or new symptoms occur.     If you have lab work done today you will be contacted with your lab results within the next 2 weeks.  If you have not heard from [place] then please contact us. The fastest way to get your results is to register for My Chart.   IF you received an x-ray today, you will receive an invoice from [facility]. Please contact [facility] at [phone] with questions or concerns regarding your invoice.   IF you received labwork today, you will receive an invoice from [place]. Please contact [facility] at [phone] with questions or concerns regarding your invoice.   Our billing staff will not be able to assist you with questions regarding bills from these companies.  You will be contacted with the lab results as soon as they are available. The fastest way to get your results is to activate your My Chart account. Instructions are located on the last page of this paperwork. If you have not heard from [place] regarding the results in 2 weeks, please contact this office.       Signed,   Merri Ray, MD [facility].  07/20/19 1:54 PM

## 2019-07-20 ENCOUNTER — Encounter: Payer: Self-pay | Admitting: Family Medicine

## 2019-07-20 LAB — CK: Total CK: 173 U/L (ref 32–182)

## 2019-07-20 LAB — VITAMIN D 25 HYDROXY (VIT D DEFICIENCY, FRACTURES): Vit D, 25-Hydroxy: 33.4 ng/mL (ref 30.0–100.0)

## 2019-07-20 LAB — CBC
Hematocrit: 42.9 % (ref 34.0–46.6)
Hemoglobin: 14.2 g/dL (ref 11.1–15.9)
MCH: 31.1 pg (ref 26.6–33.0)
MCHC: 33.1 g/dL (ref 31.5–35.7)
MCV: 94 fL (ref 79–97)
Platelets: 254 10*3/uL (ref 150–450)
RBC: 4.56 x10E6/uL (ref 3.77–5.28)
RDW: 12.8 % (ref 11.7–15.4)
WBC: 14.2 10*3/uL — ABNORMAL HIGH (ref 3.4–10.8)

## 2019-07-20 LAB — SEDIMENTATION RATE: Sed Rate: 11 mm/hr (ref 0–40)

## 2019-07-20 LAB — PTH, INTACT AND CALCIUM
Calcium: 10.8 mg/dL — ABNORMAL HIGH (ref 8.7–10.3)
PTH: 28 pg/mL (ref 15–65)

## 2019-07-20 LAB — RHEUMATOID FACTOR: Rheumatoid fact SerPl-aCnc: <10 IU/mL (ref 0.0–13.9)

## 2019-07-21 ENCOUNTER — Telehealth: Payer: Self-pay | Admitting: Family Medicine

## 2019-07-21 LAB — PATHOLOGIST SMEAR REVIEW
Basophils Absolute: 0.1 10*3/uL (ref 0.0–0.2)
Basos: 1 %
EOS (ABSOLUTE): 0.3 10*3/uL (ref 0.0–0.4)
Eos: 2 %
Hematocrit: 41.7 % (ref 34.0–46.6)
Hemoglobin: 14 g/dL (ref 11.1–15.9)
Immature Grans (Abs): 0 10*3/uL (ref 0.0–0.1)
Immature Granulocytes: 0 %
Lymphocytes Absolute: 6.7 10*3/uL — ABNORMAL HIGH (ref 0.7–3.1)
Lymphs: 46 %
MCH: 31.1 pg (ref 26.6–33.0)
MCHC: 33.6 g/dL (ref 31.5–35.7)
MCV: 93 fL (ref 79–97)
Monocytes Absolute: 0.7 10*3/uL (ref 0.1–0.9)
Monocytes: 5 %
Neutrophils Absolute: 6.8 10*3/uL (ref 1.4–7.0)
Neutrophils: 46 %
Path Rev PLTs: NORMAL
Path Rev RBC: NORMAL
Platelets: 276 10*3/uL (ref 150–450)
RBC: 4.5 x10E6/uL (ref 3.77–5.28)
RDW: 12.8 % (ref 11.7–15.4)
WBC: 14.6 10*3/uL — ABNORMAL HIGH (ref 3.4–10.8)

## 2019-07-21 NOTE — Telephone Encounter (Signed)
Please advise message below  °

## 2019-07-21 NOTE — Telephone Encounter (Signed)
Pt says that  Ins won't cover her referral for her back , Can't afford to go at this moment   Asking if there is anything she can do at home for the time being   Please advise  cb [phone]

## 2019-07-22 NOTE — Telephone Encounter (Signed)
FYI

## 2019-07-22 NOTE — Telephone Encounter (Signed)
Pt has changed her mind and is going to see dr for her back on Monday

## 2019-07-23 ENCOUNTER — Telehealth: Payer: Self-pay | Admitting: Family Medicine

## 2019-07-23 NOTE — Telephone Encounter (Signed)
Faxed over referral info to fax to [phone]  From [place] on 07/23/2019 FR

## 2019-07-29 ENCOUNTER — Other Ambulatory Visit: Payer: Self-pay | Admitting: Orthopedic Surgery

## 2019-07-29 DIAGNOSIS — M542 Cervicalgia: Secondary | ICD-10-CM

## 2019-07-29 DIAGNOSIS — M545 Low back pain, unspecified: Secondary | ICD-10-CM

## 2019-08-02 ENCOUNTER — Encounter: Payer: Self-pay | Admitting: Family Medicine

## 2019-08-02 ENCOUNTER — Other Ambulatory Visit: Payer: Self-pay

## 2019-08-02 ENCOUNTER — Ambulatory Visit (INDEPENDENT_AMBULATORY_CARE_PROVIDER_SITE_OTHER): Admitting: Family Medicine

## 2019-08-02 VITALS — BP 123/74 | HR 75 | Temp 98.4°F | Wt 213.6 lb

## 2019-08-02 DIAGNOSIS — I10 Essential (primary) hypertension: Secondary | ICD-10-CM

## 2019-08-02 DIAGNOSIS — M791 Myalgia, unspecified site: Secondary | ICD-10-CM

## 2019-08-02 DIAGNOSIS — E7800 Pure hypercholesterolemia, unspecified: Secondary | ICD-10-CM

## 2019-08-02 DIAGNOSIS — E039 Hypothyroidism, unspecified: Secondary | ICD-10-CM

## 2019-08-02 DIAGNOSIS — D72829 Elevated white blood cell count, unspecified: Secondary | ICD-10-CM | POA: Diagnosis not present

## 2019-08-02 DIAGNOSIS — M5137 Other intervertebral disc degeneration, lumbosacral region: Secondary | ICD-10-CM

## 2019-08-02 DIAGNOSIS — M5441 Lumbago with sciatica, right side: Secondary | ICD-10-CM

## 2019-08-02 DIAGNOSIS — M5442 Lumbago with sciatica, left side: Secondary | ICD-10-CM

## 2019-08-02 DIAGNOSIS — G4700 Insomnia, unspecified: Secondary | ICD-10-CM

## 2019-08-02 MED ORDER — GABAPENTIN 300 MG PO CAPS: 600.0000 mg | ORAL_CAPSULE | Freq: Every day | ORAL | 0 refills | Status: DC

## 2019-08-02 MED ORDER — POTASSIUM CHLORIDE CRYS ER 10 MEQ PO TBCR: 10.0000 meq | EXTENDED_RELEASE_TABLET | Freq: Two times a day (BID) | ORAL | 1 refills | Status: DC

## 2019-08-02 MED ORDER — LEVOTHYROXINE SODIUM 112 MCG PO TABS: ORAL_TABLET | ORAL | 1 refills | Status: DC

## 2019-08-02 MED ORDER — HYDROCHLOROTHIAZIDE 25 MG PO TABS: 25.0000 mg | ORAL_TABLET | Freq: Every day | ORAL | 3 refills | Status: DC

## 2019-08-02 MED ORDER — TRAZODONE HCL 50 MG PO TABS: 25.0000 mg | ORAL_TABLET | Freq: Every evening | ORAL | 1 refills | Status: DC | PRN

## 2019-08-02 MED ORDER — CELECOXIB 200 MG PO CAPS: 200.0000 mg | ORAL_CAPSULE | Freq: Two times a day (BID) | ORAL | 0 refills | Status: DC

## 2019-08-02 MED ORDER — METHOCARBAMOL 750 MG PO TABS: 750.0000 mg | ORAL_TABLET | Freq: Three times a day (TID) | ORAL | 1 refills | Status: DC | PRN

## 2019-08-02 NOTE — Progress Notes (Signed)
Subjective:  Patient ID: Natalie Eaton, female    DOB: 01/29/1956  Age: 63 y.o. MRN: [id number]  CC:  Chief Complaint  Patient presents with  . Follow-up    was seen here on 07/19/19. Need refill on medication and wanted to let the Dr know i will be stop taking the lisinopril to see if this is causing the leg pain. Have started on new suppliment and want to talk to the dr about    HPI Natalie Eaton presents for  Follow up from 11/2.   Leg pains: History of degenerative disc disease of lumbar spine, thought possible radicular pain in legs.  CPK was obtained which was normal. Sed rate, vitamin D, rheumatoid factor are also normal.  Recommended evaluation with back specialist, to decide on PT versus injection versus advanced imaging.    Had appointment with Dr. Laurena Bering office. Had xrays of neck and back. MRI on 12/5, and appt on 12/8.  Feels like Lipitor may be causing pain - has caused leg pains in past.  Plans on few weeks off for a week or two.  Healthy eating program - health supplements. Chromium once per day. On turmeric.  Focus supplement has green tea/ginseng - stopping as jittery with this.   On celebrex, robaxin, gabapentin  Wt Readings from Last 3 Encounters:  08/02/19 213 lb 9.6 oz (96.9 kg)  07/19/19 215 lb 9.6 oz (97.8 kg)  06/10/19 222 lb 3.2 oz (100.8 kg)   Hand joint swelling Prominent DIP joints noted last visit.  Sed rate, rheumatoid factor normal.  Option of rheumatology eval discussed.  She has taken Robaxin, Celebrex for joint pains, back pain.  tylenol discussed.   Hypercalcemia: Calcium 9.7 11 months ago, increased to 10.5 47-month ago, 10.8 2 weeks ago with normal PTH of 28. MRI of lumbar spine pending. No calcium supplements.   Leukocytosis: WBC 11.5 1-year ago, 13.9 24-month ago, 14.6 2 weeks ago.  Pathology review with normal RBCs and platelets.  Mild absolute lymphocytosis.  No infection symptoms. No fevers. Hot flushes only at night - not new.  Lab  Results  Component Value Date   WBC 14.6 (H) 07/19/2019   HGB 14.0 07/19/2019   HCT 41.7 07/19/2019   MCV 93 07/19/2019   PLT 276 07/19/2019   Hypothyroidism: Takes levothyroxine 112 mcg daily.  Lab Results  Component Value Date   TSH 2.070 06/10/2019   Insomnia: Takes trazodone 25 to 50 mg nightly as needed. Taking full pill. Goes to sleep, wakes up with some soreness in back, hips once. No meds. Methocarbamol during day and night.   Hypertension: Hydrochlorothiazide 25 mg daily, with potassium supplement 10 mEq twice daily. Home readings: BP Readings from Last 3 Encounters:  08/02/19 123/74  07/19/19 138/83  06/10/19 128/83   Lab Results  Component Value Date   CREATININE 0.80 06/10/2019     History Patient Active Problem List   Diagnosis Date Noted  . Arthritis 06/10/2019  . MVP (mitral valve prolapse) 06/10/2019  . Statin intolerance 02/17/2019  . Pure hypercholesterolemia 11/16/2018  . 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 BMI 32.0-32.9,adult 02/24/2018  . Essential hypertension, benign 11/21/2017  . Hypothyroidism 11/21/2017   Past Medical History:  Diagnosis Date  . Back pain   . Closed TBI (traumatic brain injury) ([place])   . Coronary artery disease   . Epilepsy ([place])   . History of hysterectomy   . 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   Type  1 and 2, per gyn  . Hypertension   . Hypothyroidism   . Insomnia   . MVP (mitral valve prolapse)   . Pneumonia   . Pre-diabetes    Past Surgical History:  Procedure Laterality Date  . ABDOMINAL HYSTERECTOMY    . BLADDER SURGERY    . CARDIAC CATHETERIZATION    . GALLBLADDER SURGERY    . LUNG REMOVAL, PARTIAL    . ROTATOR CUFF REPAIR    . TONSILLECTOMY     Allergies  Allergen Reactions  . Codeine Other (See Comments)    Tingling on her scalp.   Prior to Admission medications   Medication Sig Start Date End Date Taking? Authorizing Provider  aspirin 81 MG chewable tablet Chew 81  mg by mouth daily.    Yes [provider]  atorvastatin (LIPITOR) 40 MG tablet Take 1 tablet (40 mg total) by mouth daily. 06/15/19  Yes Rutherford Guys, MD  celecoxib (CELEBREX) 200 MG capsule Take 1 capsule by mouth twice daily 05/25/19  Yes Rutherford Guys, MD  cyclobenzaprine (FLEXERIL) 10 MG tablet Take 10 mg by mouth 3 (three) times daily as needed for muscle spasms.   Yes [provider]  diclofenac sodium (VOLTAREN) 1 % GEL Apply 2 g topically 4 (four) times daily. 02/17/19  Yes Rutherford Guys, MD  EUTHYROX 112 MCG tablet TAKE 1 TABLET BY MOUTH ONCE DAILY BEFORE BREAKFAST 06/01/19  Yes Rutherford Guys, MD  gabapentin (NEURONTIN) 300 MG capsule TAKE 2 CAPSULES BY MOUTH AT BEDTIME 02/25/19  Yes Rutherford Guys, MD  hydrochlorothiazide (HYDRODIURIL) 25 MG tablet Take 1 tablet (25 mg total) by mouth daily. 08/21/18  Yes Rutherford Guys, MD  methocarbamol (ROBAXIN) 750 MG tablet Take 1 tablet (750 mg total) by mouth every 8 (eight) hours as needed for muscle spasms. 08/21/18  Yes Rutherford Guys, MD  Multiple Vitamins-Minerals (HAIR SKIN AND NAILS FORMULA PO) Take 1 tablet by mouth daily.   Yes [provider]  NON FORMULARY [employer] craving control   Yes [provider]  NON FORMULARY [employer] Focus & energy   Yes [provider]  NON FORMULARY [employer] neurovite plus   Yes [provider]  potassium chloride (K-DUR) 10 MEQ tablet Take 1 tablet (10 mEq total) by mouth 2 (two) times daily. 02/17/19  Yes Rutherford Guys, MD  traZODone (DESYREL) 50 MG tablet Take 0.5-1 tablets (25-50 mg total) by mouth at bedtime as needed for sleep. 08/21/18  Yes Rutherford Guys, MD  TURMERIC PO Take by mouth. Qunol brand   Yes [provider]  valACYclovir (VALTREX) 500 MG tablet Take 500 mg by mouth daily. 06/01/18  Yes [provider]  vitamin E 400 UNIT capsule Take 400 Units by mouth 2 (two) times daily.   Yes [provider]    Social History   Socioeconomic History  . Marital status: Widowed    Spouse name: Not on file  . Number of children: 2  . Years of education: Not on file  . Highest education level: Not on file  Occupational History  . Not on file  Social Needs  . Financial resource strain: Not on file  . Food insecurity    Worry: Not on file    Inability: Not on file  . Transportation needs    Medical: Not on file    Non-medical: Not on file  Tobacco Use  . Smoking status: Former Research scientist (life sciences)  . Smokeless tobacco: Never  Used  Substance and Sexual Activity  . Alcohol use: Yes    Alcohol/week: 1.0 standard drinks    Types: 1 Glasses of wine per week    Frequency: Never  . Drug use: No  . Sexual activity: Not Currently  Lifestyle  . Physical activity    Days per week: Not on file    Minutes per session: Not on file  . Stress: Not on file  Relationships  . Social Herbalist on phone: Not on file    Gets together: Not on file    Attends religious service: Not on file    Active member of club or organization: Not on file    Attends meetings of clubs or organizations: Not on file    Relationship status: Not on file  . Intimate partner violence    Fear of current or ex partner: Not on file    Emotionally abused: Not on file    Physically abused: Not on file    Forced sexual activity: Not on file  Other Topics Concern  . Not on file  Social History Narrative  . Not on file    Review of Systems Per HPI  Objective:   Vitals:   08/02/19 1202  BP: 123/74  Pulse: 75  Temp: 98.4 F (36.9 C)  TempSrc: Oral  SpO2: 97%  Weight: 213 lb 9.6 oz (96.9 kg)     Physical Exam Vitals signs reviewed.  Constitutional:      Appearance: She is well-developed.  HENT:     Head: Normocephalic and atraumatic.  Eyes:     Conjunctiva/sclera: Conjunctivae normal.     Pupils: Pupils are equal, round, and reactive to light.  Neck:     Musculoskeletal: Neck supple. No muscular tenderness.      Vascular: No carotid bruit.  Cardiovascular:     Rate and Rhythm: Normal rate and regular rhythm.     Heart sounds: Normal heart sounds.  Pulmonary:     Effort: Pulmonary effort is normal.     Breath sounds: Normal breath sounds.  Abdominal:     Palpations: Abdomen is soft. There is no pulsatile mass.     Tenderness: There is no abdominal tenderness.  Skin:    General: Skin is warm and dry.  Neurological:     Mental Status: She is alert and oriented to person, place, and time.  Psychiatric:        Behavior: Behavior normal.        Assessment & Plan:  Natalie Eaton is a 63 y.o. female . Hypothyroidism, unspecified type - Plan: levothyroxine (EUTHYROX) 112 MCG tablet, potassium chloride (KLOR-CON) 10 MEQ tablet  -Stable, continue same dose levothyroxine.  Recheck levels in approximately 6 months  Essential hypertension, benign - Plan: hydrochlorothiazide (HYDRODIURIL) 25 MG tablet  -Stable on HCTZ, no changes.  Hypercalcemia  -Slight elevation with normal PTH, repeat test in 1 month.  Sooner endocrine eval if persistent elevation.  Plan for MRI with back specialist, evaluate for bony lesions.  Leukocytosis, unspecified type  -Afebrile.  Reactive leukocytosis noted.  Repeat testing in 1 month with RTC precautions if any signs or symptoms of acute illness/infection.  Low back pain with bilateral sciatica, unspecified back pain laterality, unspecified chronicity - Plan: celecoxib (CELEBREX) 200 MG capsule, gabapentin (NEURONTIN) 300 MG capsule, methocarbamol (ROBAXIN) 750 MG tablet Myalgia Degeneration of lumbar or lumbosacral intervertebral disc - Plan: celecoxib (CELEBREX) 200 MG capsule, gabapentin (NEURONTIN) 300 MG capsule, methocarbamol (ROBAXIN) 750  MG tablet  -Follow-up with back specialist as planned with MRI, than past injections.  Refilled Celebrex, gabapentin, methocarbamol for now but discussed ideally would like to wean off Celebrex, other meds as tolerated.   Pure hypercholesterolemia  -Unlikely statin cause of leg pains but I think it is reasonable for her to try off medication for a week or 2 to see if change in symptoms.  If that does improve symptoms, then can try intermittent dosing to see if that is tolerated better.  Insomnia, unspecified type - Plan: traZODone (DESYREL) 50 MG tablet  -Stable, continue trazodone. Stop green tea/ginseng supplement.  No orders of the defined types were placed in this encounter.  Patient Instructions     Continue follow-up as planned with the back specialist but if you want to try stopping Lipitor for a week or 2 to see if that also is contributing to leg pains, that is fine.  If it does help to be off the Lipitor, then can try restarting it every 2 to 3 days or as tolerated.  Let me know what works.   Keep up the good work with weight loss.   Ok to continue celebrex for now, but ideally would prefer tylenol as back pain/arthritis improves. Robaxin refilled as well.   I will recheck calcium at future visit, as parathyroid level ok.   If needed, you can take tylenol in the middle of the night for pain.   No change in other meds for now.   Recheck in 1 month with repeat blood count. Sooner if any fever, or infection symptoms.   I agree with stopping ginseng/green tea supplement.      If you have lab work done today you will be contacted with your lab results within the next 2 weeks.  If you have not heard from [place] then please contact us. The fastest way to get your results is to register for My Chart.   IF you received an x-ray today, you will receive an invoice from [facility]. Please contact [facility] at [phone] with questions or concerns regarding your invoice.   IF you received labwork today, you will receive an invoice from [place]. Please contact [facility] at [phone] with questions or concerns regarding your invoice.   Our billing staff will not be able to assist  you with questions regarding bills from these companies.  You will be contacted with the lab results as soon as they are available. The fastest way to get your results is to activate your My Chart account. Instructions are located on the last page of this paperwork. If you have not heard from [place] regarding the results in 2 weeks, please contact this office.          Signed, Merri Ray, MD [facility]

## 2019-08-02 NOTE — Patient Instructions (Addendum)
   Continue follow-up as planned with the back specialist but if you want to try stopping Lipitor for a week or 2 to see if that also is contributing to leg pains, that is fine.  If it does help to be off the Lipitor, then can try restarting it every 2 to 3 days or as tolerated.  Let me know what works.   Keep up the good work with weight loss.   Ok to continue celebrex for now, but ideally would prefer tylenol as back pain/arthritis improves. Robaxin refilled as well.   I will recheck calcium at future visit, as parathyroid level ok.   If needed, you can take tylenol in the middle of the night for pain.   No change in other meds for now.   Recheck in 1 month with repeat blood count. Sooner if any fever, or infection symptoms.   I agree with stopping ginseng/green tea supplement.      If you have lab work done today you will be contacted with your lab results within the next 2 weeks.  If you have not heard from [place] then please contact us. The fastest way to get your results is to register for My Chart.   IF you received an x-ray today, you will receive an invoice from [facility]. Please contact [facility] at [phone] with questions or concerns regarding your invoice.   IF you received labwork today, you will receive an invoice from [place]. Please contact [facility] at [phone] with questions or concerns regarding your invoice.   Our billing staff will not be able to assist you with questions regarding bills from these companies.  You will be contacted with the lab results as soon as they are available. The fastest way to get your results is to activate your My Chart account. Instructions are located on the last page of this paperwork. If you have not heard from [place] regarding the results in 2 weeks, please contact this office.

## 2019-08-03 ENCOUNTER — Encounter: Payer: Self-pay | Admitting: Family Medicine

## 2019-08-17 ENCOUNTER — Ambulatory Visit
Admission: RE | Admit: 2019-08-17 | Discharge: 2019-08-17 | Disposition: A | Discharge status: Home / Self Care | Payer: PRIVATE HEALTH INSURANCE | Source: Ambulatory Visit | Attending: Orthopedic Surgery | Admitting: Orthopedic Surgery

## 2019-08-17 ENCOUNTER — Other Ambulatory Visit: Payer: Self-pay

## 2019-08-17 DIAGNOSIS — M545 Low back pain, unspecified: Secondary | ICD-10-CM

## 2019-08-17 DIAGNOSIS — M542 Cervicalgia: Secondary | ICD-10-CM

## 2019-08-21 ENCOUNTER — Other Ambulatory Visit: Payer: PRIVATE HEALTH INSURANCE

## 2019-08-26 ENCOUNTER — Other Ambulatory Visit: Payer: Self-pay

## 2019-08-26 ENCOUNTER — Encounter: Payer: Self-pay | Admitting: Family Medicine

## 2019-08-26 ENCOUNTER — Ambulatory Visit (INDEPENDENT_AMBULATORY_CARE_PROVIDER_SITE_OTHER): Admitting: Family Medicine

## 2019-08-26 VITALS — BP 121/78 | HR 90 | Temp 98.3°F | Wt 217.4 lb

## 2019-08-26 DIAGNOSIS — M791 Myalgia, unspecified site: Secondary | ICD-10-CM

## 2019-08-26 DIAGNOSIS — D72829 Elevated white blood cell count, unspecified: Secondary | ICD-10-CM

## 2019-08-26 DIAGNOSIS — E782 Mixed hyperlipidemia: Secondary | ICD-10-CM

## 2019-08-26 NOTE — Patient Instructions (Addendum)
Try 1/2 pill of lipitor once per week. If tolerated, then can add in additional days slowly as tolerated.  I will recheck blood counts and calcium today.  If blood counts remain elevated, I may refer you to hematology to discuss further work-up if needed versus continued monitoring. Keep follow-up with back specialist.  No other medication changes today.  Follow-up in approximately 6-8 weeks to check cholesterol levels with new plan above.  Let me know if there are questions in the meantime and take care.  Return to the clinic or go to the nearest emergency room if any of your symptoms worsen or new symptoms occur.   If you have lab work done today you will be contacted with your lab results within the next 2 weeks.  If you have not heard from [place] then please contact us. The fastest way to get your results is to register for My Chart.   IF you received an x-ray today, you will receive an invoice from [facility]. Please contact [facility] at [phone] with questions or concerns regarding your invoice.   IF you received labwork today, you will receive an invoice from [place]. Please contact [facility] at [phone] with questions or concerns regarding your invoice.   Our billing staff will not be able to assist you with questions regarding bills from these companies.  You will be contacted with the lab results as soon as they are available. The fastest way to get your results is to activate your My Chart account. Instructions are located on the last page of this paperwork. If you have not heard from [place] regarding the results in 2 weeks, please contact this office.

## 2019-08-26 NOTE — Progress Notes (Signed)
Subjective:  Patient ID: Natalie Eaton, female    DOB: 1956/03/24  Age: 63 y.o. MRN: [id number]  CC:  Chief Complaint  Patient presents with   Follow-up    repeat blood work and f/u    HPI Natalie Eaton presents for follow-up from concerns on November 16 visit  Leukocytosis See previous notes.  Afebrile, reactive leukocytosis noted on pathology review.  Plan on repeat testing today.  No prior acute illness/infectious symptoms.  No fever, no infection symptoms since last visit. No new cough, no dysuria/frequency.  Lab Results  Component Value Date   WBC 14.6 (H) 07/19/2019   HGB 14.0 07/19/2019   HCT 41.7 07/19/2019   MCV 93 07/19/2019   PLT 276 07/19/2019   Low back pain with leg pain/sciatica Continued on Celebrex, gabapentin, methocarbamol at last visit with ideal plan to wean off Celebrex.  Plan to follow-up with back specialist.  Option of stopping statin temporarily to see if that was associated with leg pains.   No cramps and improved leg pains with stopping lipitor after 4 days. Tried every other day - return of myalgias. Off altogether now.  Prior labs off statin.   appt with Dr. Mina Marble tomorrow to decide on injections for back pain. Followed by Dr. Lynann Bologna.    Hypercalcemia: Slight elevation with normal PTH previously, plan for repeat today. Not taking calcium supplements  Lab Results  Component Value Date   PTH 28 07/19/2019   PTH Comment 07/19/2019   CALCIUM 10.8 (H) 07/19/2019      History Patient Active Problem List   Diagnosis Date Noted   Arthritis 06/10/2019   MVP (mitral valve prolapse) 06/10/2019   Statin intolerance 02/17/2019   Pure hypercholesterolemia 11/16/2018   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   BMI 32.0-32.9,adult 02/24/2018   Essential hypertension, benign 11/21/2017   Hypothyroidism 11/21/2017   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[place])     Coronary artery disease    Epilepsy ([place])    History of hysterectomy    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   Prior to Admission medications   Medication Sig Start Date End Date Taking? Authorizing Provider  aspirin 81 MG chewable tablet Chew 81 mg by mouth daily.    Yes [provider]  atorvastatin (LIPITOR) 40 MG tablet Take 1 tablet (40 mg total) by mouth daily. 06/15/19  Yes Rutherford Guys, MD  celecoxib (CELEBREX) 200 MG capsule Take 1 capsule (200 mg total) by mouth 2 (two) times daily. 08/02/19  Yes Wendie Agreste, MD  cyclobenzaprine (FLEXERIL) 10 MG tablet Take 10 mg by mouth 3 (three) times daily as needed for muscle spasms.   Yes [provider]  diclofenac sodium (VOLTAREN) 1 % GEL Apply 2 g topically 4 (four) times daily. 02/17/19  Yes Rutherford Guys, MD  gabapentin (NEURONTIN) 300 MG capsule Take 2 capsules (600 mg total) by mouth at bedtime. 08/02/19  Yes Wendie Agreste, MD  hydrochlorothiazide (HYDRODIURIL) 25 MG tablet Take 1 tablet (25 mg total) by mouth daily. 08/02/19  Yes Wendie Agreste, MD  levothyroxine (EUTHYROX) 112 MCG tablet TAKE 1 TABLET BY MOUTH ONCE DAILY BEFORE BREAKFAST 08/02/19  Yes Wendie Agreste, MD  methocarbamol (ROBAXIN) 750 MG tablet Take 1 tablet (750 mg total) by mouth every 8 (eight) hours as needed for muscle spasms. 08/02/19  Yes Wendie Agreste, MD  Multiple Vitamins-Minerals (HAIR SKIN AND NAILS FORMULA PO) Take 1 tablet by mouth daily.   Yes  [provider]  NON FORMULARY [employer] craving control   Yes [provider]  NON FORMULARY [employer] Focus & energy   Yes [provider]  NON FORMULARY [employer] neurovite plus   Yes [provider]  potassium chloride (KLOR-CON) 10 MEQ tablet Take 1 tablet (10 mEq total) by mouth 2 (two) times daily. 08/02/19  Yes Wendie Agreste, MD  traZODone (DESYREL) 50 MG tablet Take 0.5-1 tablets (25-50 mg total) by mouth at bedtime as needed for sleep. 08/02/19  Yes Wendie Agreste, MD  TURMERIC PO Take by mouth. Qunol brand   Yes [provider]  valACYclovir (VALTREX) 500 MG tablet Take 500 mg by mouth daily. 06/01/18  Yes [provider]  vitamin E 400 UNIT capsule Take 400 Units by mouth 2 (two) times daily.   Yes [provider]   Social History   Socioeconomic History   Marital status: Widowed    Spouse name: Not on file   Number of children: 2   Years of education: Not on file   Highest education level: Not on file  Occupational History   Not on file  Tobacco Use   Smoking status: Former Smoker   Smokeless tobacco: Never Used  Substance and Sexual Activity   Alcohol use: Yes    Alcohol/week: 1.0 standard drinks    Types: 1 Glasses of wine per week   Drug use: No   Sexual activity: Not Currently  Other Topics Concern   Not on file  Social History Narrative   Not on file   Social Determinants of Health   Financial Resource Strain:    Difficulty of Paying Living Expenses: Not on file  Food Insecurity:    Worried About [place] in the Last Year: Not on file   Ran Out of Food in the Last Year: Not on file  Transportation Needs:    Lack of Transportation (Medical): Not on file   Lack of Transportation (Non-Medical): Not on file  Physical Activity:    Days of Exercise per Week: Not on file   Minutes of Exercise per Session: Not on file  Stress:    Feeling of Stress : Not on file    Social Connections:    Frequency of Communication with Friends and Family: Not on file   Frequency of Social Gatherings with Friends and Family: Not on file   Attends Religious Services: Not on file   Active Member of Clubs or Organizations: Not on file   Attends Archivist Meetings: Not on file   Marital Status: Not on file  Intimate Partner Violence:    Fear of Current or Ex-Partner: Not on file   Emotionally Abused: Not on file   Physically Abused: Not on file   Sexually  Abused: Not on file    Review of Systems   Objective:   Vitals:   08/26/19 0906  BP: 121/78  Pulse: 90  Temp: 98.3 F (36.8 C)  TempSrc: Temporal  SpO2: 98%  Weight: 217 lb 6.4 oz (98.6 kg)     Physical Exam Constitutional:      General: She is not in acute distress.    Appearance: She is well-developed.  HENT:     Head: Normocephalic and atraumatic.  Cardiovascular:     Rate and Rhythm: Normal rate and regular rhythm.     Heart sounds: No murmur.  Pulmonary:     Effort: Pulmonary effort is normal.     Breath sounds: Normal breath sounds.  Abdominal:     Tenderness: There is no abdominal tenderness.  Musculoskeletal:     Comments: Legs, specifically quads and calves nontender.  Neurological:     Mental Status: She is alert and oriented to person, place, and time.      Assessment & Plan:  Natalie Eaton is a 63 y.o. female . Leukocytosis, unspecified type - Plan: POCT CBC  -Afebrile, no apparent infection symptoms/signs.  Reactive leukocytosis stopped prior on review.  Repeat CBC today, if persistent elevation then refer to hematology  Hypercalcemia - Plan: Calcium  -Reassuring PTH, repeat calcium.  Myalgia Mixed hyperlipidemia  -Has follow-up with orthopedic and physical medicine/rehab for consideration of injections for sciatica/degenerative disc disease.    -Other myalgias did improve with cessation of statin.  We will try half dose Lipitor 40mg  once per week  initially, then slowly increase as tolerated with repeat lipid testing in 6 to 8 weeks.  No orders of the defined types were placed in this encounter.  Patient Instructions   Try 1/2 pill of lipitor once per week. If tolerated, then can add in additional days slowly as tolerated.  I will recheck blood counts and calcium today.  If blood counts remain elevated, I may refer you to hematology to discuss further work-up if needed versus continued monitoring. Keep follow-up with back specialist.  No other medication changes today.  Follow-up in approximately 6-8 weeks to check cholesterol levels with new plan above.  Let me know if there are questions in the meantime and take care.  Return to the clinic or go to the nearest emergency room if any of your symptoms worsen or new symptoms occur.   If you have lab work done today you will be contacted with your lab results within the next 2 weeks.  If you have not heard from [place] then please contact us. The fastest way to get your results is to register for My Chart.   IF you received an x-ray today, you will receive an invoice from [facility]. Please contact [facility] at [phone] with questions or concerns regarding your invoice.   IF you received labwork today, you will receive an invoice from [facility]. Please contact [facility] at [phone] with questions or concerns regarding your invoice.   Our billing staff will not be able to assist you with questions regarding bills from these companies.  You will be contacted with the lab results as soon as they are available. The fastest way to get your results is to activate your My Chart account. Instructions are located on the last page of this paperwork. If you have not heard from [place] regarding the results in 2 weeks, please contact this office.          Signed, Merri Ray,  MD [facility]

## 2019-08-27 LAB — CBC WITH DIFFERENTIAL/PLATELET
Basophils Absolute: 0 10*3/uL (ref 0.0–0.2)
Basos: 0 %
EOS (ABSOLUTE): 0.2 10*3/uL (ref 0.0–0.4)
Eos: 2 %
Hematocrit: 41.3 % (ref 34.0–46.6)
Hemoglobin: 13.9 g/dL (ref 11.1–15.9)
Immature Grans (Abs): 0 10*3/uL (ref 0.0–0.1)
Immature Granulocytes: 0 %
Lymphocytes Absolute: 4.9 10*3/uL — ABNORMAL HIGH (ref 0.7–3.1)
Lymphs: 50 %
MCH: 31 pg (ref 26.6–33.0)
MCHC: 33.7 g/dL (ref 31.5–35.7)
MCV: 92 fL (ref 79–97)
Monocytes Absolute: 0.5 10*3/uL (ref 0.1–0.9)
Monocytes: 5 %
Neutrophils Absolute: 4.3 10*3/uL (ref 1.4–7.0)
Neutrophils: 43 %
Platelets: 251 10*3/uL (ref 150–450)
RBC: 4.48 x10E6/uL (ref 3.77–5.28)
RDW: 12.6 % (ref 11.7–15.4)
WBC: 10.1 10*3/uL (ref 3.4–10.8)

## 2019-08-27 LAB — CALCIUM: Calcium: 9.9 mg/dL (ref 8.7–10.3)

## 2019-09-16 ENCOUNTER — Encounter: Payer: Self-pay | Admitting: Family Medicine

## 2019-11-24 ENCOUNTER — Other Ambulatory Visit: Payer: Self-pay | Admitting: Family Medicine

## 2019-11-24 DIAGNOSIS — M5441 Lumbago with sciatica, right side: Secondary | ICD-10-CM

## 2019-11-24 DIAGNOSIS — M5137 Other intervertebral disc degeneration, lumbosacral region: Secondary | ICD-10-CM

## 2019-11-26 NOTE — Telephone Encounter (Signed)
No action needed

## 2019-12-31 ENCOUNTER — Encounter: Payer: Self-pay | Admitting: Family Medicine

## 2020-01-24 ENCOUNTER — Encounter: Payer: Self-pay | Admitting: Family Medicine

## 2020-01-24 ENCOUNTER — Ambulatory Visit (INDEPENDENT_AMBULATORY_CARE_PROVIDER_SITE_OTHER): Admitting: Family Medicine

## 2020-01-24 ENCOUNTER — Other Ambulatory Visit: Payer: Self-pay

## 2020-01-24 VITALS — BP 117/74 | HR 68 | Temp 98.3°F | Ht 68.0 in | Wt 206.4 lb

## 2020-01-24 DIAGNOSIS — N951 Menopausal and female climacteric states: Secondary | ICD-10-CM

## 2020-01-24 DIAGNOSIS — G4700 Insomnia, unspecified: Secondary | ICD-10-CM

## 2020-01-24 DIAGNOSIS — M5441 Lumbago with sciatica, right side: Secondary | ICD-10-CM

## 2020-01-24 DIAGNOSIS — R61 Generalized hyperhidrosis: Secondary | ICD-10-CM

## 2020-01-24 DIAGNOSIS — M5442 Lumbago with sciatica, left side: Secondary | ICD-10-CM

## 2020-01-24 DIAGNOSIS — E039 Hypothyroidism, unspecified: Secondary | ICD-10-CM

## 2020-01-24 DIAGNOSIS — I10 Essential (primary) hypertension: Secondary | ICD-10-CM

## 2020-01-24 DIAGNOSIS — E782 Mixed hyperlipidemia: Secondary | ICD-10-CM

## 2020-01-24 DIAGNOSIS — M5137 Other intervertebral disc degeneration, lumbosacral region: Secondary | ICD-10-CM

## 2020-01-24 MED ORDER — LEVOTHYROXINE SODIUM 112 MCG PO TABS: ORAL_TABLET | ORAL | 1 refills | Status: DC

## 2020-01-24 MED ORDER — TRAZODONE HCL 50 MG PO TABS: 25.0000 mg | ORAL_TABLET | Freq: Every evening | ORAL | 1 refills | Status: DC | PRN

## 2020-01-24 MED ORDER — CELECOXIB 200 MG PO CAPS: 200.0000 mg | ORAL_CAPSULE | Freq: Two times a day (BID) | ORAL | 0 refills | Status: DC

## 2020-01-24 MED ORDER — HYDROCHLOROTHIAZIDE 25 MG PO TABS: 25.0000 mg | ORAL_TABLET | Freq: Every day | ORAL | 3 refills | Status: DC

## 2020-01-24 MED ORDER — METHOCARBAMOL 750 MG PO TABS: 750.0000 mg | ORAL_TABLET | Freq: Three times a day (TID) | ORAL | 1 refills | Status: DC | PRN

## 2020-01-24 MED ORDER — POTASSIUM CHLORIDE CRYS ER 10 MEQ PO TBCR: 10.0000 meq | EXTENDED_RELEASE_TABLET | Freq: Two times a day (BID) | ORAL | 1 refills | Status: DC

## 2020-01-24 NOTE — Progress Notes (Signed)
Subjective:  Patient ID: Natalie Eaton, female    DOB: 1956/07/16  Age: 64 y.o. MRN: [id number]  CC:  Chief Complaint  Patient presents with  . Hot Flashes    ongoing     HPI Natalie Eaton presents for   Hot flushes: Discussed at November 2020 visit.  Previous leukocytosis history of hypothyroidism.  Repeat CBC with resolution of leukocytosis.  Previous elevated calcium also normal.  PTH normal in November, pathologist review of prior leukocytosis indicated mild absolute lymphocytosis.   Still having some hot flushes. Various times of the day. Not specific timing. 3-6 times per day. Starts with feeling lightheaded first, then sweats. Prior glucose measurement with episodes were normal.  Intentional weigh loss with weight watchers. 18# on her scale. Max 222.  Had been on HRT (stopped by OBGYN - [facility]), no hot flushes on meds. concerned about cancer risk of HRT. Sweating started after cessation. No supplements.  Wt Readings from Last 3 Encounters:  01/24/20 206 lb 6.4 oz (93.6 kg)  08/26/19 217 lb 6.4 oz (98.6 kg)  08/02/19 213 lb 9.6 oz (96.9 kg)    Lab Results  Component Value Date   WBC 10.1 08/26/2019   HGB 13.9 08/26/2019   HCT 41.3 08/26/2019   MCV 92 08/26/2019   PLT 251 08/26/2019   Lab Results  Component Value Date   TSH 2.070 06/10/2019   Chronic low back pain/ hip pain: Hx of DDD lumbar spine, lumbar scoliosis. Pain into both hips at times with lying down. Referred to back specialist. Seen by Dr. Lynann Bologna, then Dr. Mina Marble for possible injection - but she declined injection. Wants to stay on same plan, considering meeting with different specialist in next few months - she will let me know. Still active, projects at home. Good days and bad days.  Robaxin few per day, celebrex 2 per day. Not taking flexeril much d/t sedation.   Hypertension: Hydrochlorothiazide 25 mg daily, potassium supplement 10 mEq twice daily. BP Readings from Last 3 Encounters:    01/24/20 117/74  08/26/19 121/78  08/02/19 123/74   Lab Results  Component Value Date   CREATININE 0.80 06/10/2019    Hyperlipidemia: Rx lipitor 40mg  - myalgias with full dose. Breaks off 1/3rd pill once per week. Sometimes 2 per week (1/3 pill). Myalgias with higher dosing.  Lab Results  Component Value Date   CHOL 295 (H) 06/10/2019   HDL 40 06/10/2019   LDLCALC 196 (H) 06/10/2019   TRIG 297 (H) 06/10/2019   CHOLHDL 7.4 (H) 06/10/2019   Lab Results  Component Value Date   ALT 36 (H) 06/10/2019   AST 24 06/10/2019   ALKPHOS 98 06/10/2019   BILITOT 0.3 06/10/2019   Hypothyroidism: Lab Results  Component Value Date   TSH 2.070 06/10/2019  levothyroxine 161mcg qd.    Had covid vaccine.   History Patient Active Problem List   Diagnosis Date Noted  . Arthritis 06/10/2019  . MVP (mitral valve prolapse) 06/10/2019  . Statin intolerance 02/17/2019  . Pure hypercholesterolemia 11/16/2018  . 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 BMI 32.0-32.9,adult 02/24/2018  . Essential hypertension, benign 11/21/2017  . Hypothyroidism 11/21/2017   Past Medical History:  Diagnosis Date  . Back pain   . Closed TBI (traumatic brain injury) ([place])   . Coronary artery disease   . Epilepsy ([place])   . History of hysterectomy   . 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   Type 1 and  2, per gyn  . Hypertension   . Hypothyroidism   . Insomnia   . MVP (mitral valve prolapse)   . Pneumonia   . Pre-diabetes    Past Surgical History:  Procedure Laterality Date  . ABDOMINAL HYSTERECTOMY    . BLADDER SURGERY    . CARDIAC CATHETERIZATION    . GALLBLADDER SURGERY    . LUNG REMOVAL, PARTIAL    . ROTATOR CUFF REPAIR    . TONSILLECTOMY     Allergies  Allergen Reactions  . Codeine Other (See Comments)    Tingling on her scalp.   Prior to Admission medications   Medication Sig Start Date End Date Taking? Authorizing Provider  aspirin 81 MG chewable tablet Chew 81  mg by mouth daily.    Yes [provider]  atorvastatin (LIPITOR) 40 MG tablet Take 1 tablet (40 mg total) by mouth daily. Patient taking differently: Take 13 mg by mouth daily. 1/3 of a dose once a week 06/15/19  Yes Rutherford Guys, MD  celecoxib (CELEBREX) 200 MG capsule Take 1 capsule (200 mg total) by mouth 2 (two) times daily. 08/02/19  Yes Wendie Agreste, MD  cyclobenzaprine (FLEXERIL) 10 MG tablet Take 10 mg by mouth 3 (three) times daily as needed for muscle spasms.   Yes [provider]  diclofenac sodium (VOLTAREN) 1 % GEL Apply 2 g topically 4 (four) times daily. Patient taking differently: Apply 2 g topically as needed.  02/17/19  Yes Rutherford Guys, MD  gabapentin (NEURONTIN) 300 MG capsule TAKE 2 CAPSULES BY MOUTH AT BEDTIME 11/24/19  Yes Wendie Agreste, MD  hydrochlorothiazide (HYDRODIURIL) 25 MG tablet Take 1 tablet (25 mg total) by mouth daily. 08/02/19  Yes Wendie Agreste, MD  levothyroxine (EUTHYROX) 112 MCG tablet TAKE 1 TABLET BY MOUTH ONCE DAILY BEFORE BREAKFAST 08/02/19  Yes Wendie Agreste, MD  methocarbamol (ROBAXIN) 750 MG tablet Take 1 tablet (750 mg total) by mouth every 8 (eight) hours as needed for muscle spasms. 08/02/19  Yes Wendie Agreste, MD  Multiple Vitamins-Minerals (HAIR SKIN AND NAILS FORMULA PO) Take 1 tablet by mouth daily.   Yes [provider]  potassium chloride (KLOR-CON) 10 MEQ tablet Take 1 tablet (10 mEq total) by mouth 2 (two) times daily. 08/02/19  Yes Wendie Agreste, MD  traZODone (DESYREL) 50 MG tablet Take 0.5-1 tablets (25-50 mg total) by mouth at bedtime as needed for sleep. 08/02/19  Yes Wendie Agreste, MD  TURMERIC PO Take by mouth. Qunol brand   Yes [provider]  valACYclovir (VALTREX) 500 MG tablet Take 500 mg by mouth daily. 06/01/18  Yes [provider]  vitamin E 400 UNIT capsule Take 400 Units by mouth 2 (two) times daily.   Yes [provider]   Social History    Socioeconomic History  . Marital status: Widowed    Spouse name: Not on file  . Number of children: 2  . Years of education: Not on file  . Highest education level: Not on file  Occupational History  . Not on file  Tobacco Use  . Smoking status: Former Research scientist (life sciences)  . Smokeless tobacco: Never Used  Substance and Sexual Activity  . Alcohol use: Yes    Alcohol/week: 1.0 standard drinks    Types: 1 Glasses of wine per week  . Drug use: No  . Sexual activity: Not Currently  Other Topics Concern  . Not on file  Social History Narrative  .  Not on file   Social Determinants of Health   Financial Resource Strain:   . Difficulty of Paying Living Expenses:   Food Insecurity:   . Worried About Charity fundraiser in the Last Year:   . Arboriculturist in the Last Year:   Transportation Needs:   . Film/video editor (Medical):   Marland Kitchen Lack of Transportation (Non-Medical):   Physical Activity:   . Days of Exercise per Week:   . Minutes of Exercise per Session:   Stress:   . Feeling of Stress :   Social Connections:   . Frequency of Communication with Friends and Family:   . Frequency of Social Gatherings with Friends and Family:   . Attends Religious Services:   . Active Member of Clubs or Organizations:   . Attends Archivist Meetings:   Marland Kitchen Marital Status:   Intimate Partner Violence:   . Fear of Current or Ex-Partner:   . Emotionally Abused:   Marland Kitchen Physically Abused:   . Sexually Abused:     Review of Systems  Constitutional: Positive for diaphoresis (hot flushes. ). Negative for fatigue and unexpected weight change.  Respiratory: Negative for chest tightness and shortness of breath.   Cardiovascular: Negative for chest pain, palpitations and leg swelling.  Gastrointestinal: Negative for abdominal pain and blood in stool.  Musculoskeletal: Positive for back pain (chronic).  Neurological: Negative for dizziness, syncope, light-headedness and headaches.      Objective:   Vitals:   01/24/20 0912  BP: 117/74  Pulse: 68  Temp: 98.3 F (36.8 C)  TempSrc: Temporal  SpO2: 98%  Weight: 206 lb 6.4 oz (93.6 kg)  Height: 5\' 8"  (1.727 m)     Physical Exam Vitals reviewed.  Constitutional:      Appearance: She is well-developed.  HENT:     Head: Normocephalic and atraumatic.  Eyes:     Conjunctiva/sclera: Conjunctivae normal.     Pupils: Pupils are equal, round, and reactive to light.  Neck:     Vascular: No carotid bruit.  Cardiovascular:     Rate and Rhythm: Normal rate and regular rhythm.     Heart sounds: Normal heart sounds.  Pulmonary:     Effort: Pulmonary effort is normal.     Breath sounds: Normal breath sounds.  Abdominal:     Palpations: Abdomen is soft. There is no pulsatile mass.     Tenderness: There is no abdominal tenderness.  Musculoskeletal:     Comments: No focal bony ttp lumbar spine.   Skin:    General: Skin is warm and dry.  Neurological:     Mental Status: She is alert and oriented to person, place, and time.  Psychiatric:        Mood and Affect: Mood normal.        Behavior: Behavior normal.        Assessment & Plan:  Natalie Eaton is a 64 y.o. female . Excessive sweating Hot flushes, perimenopausal  - handout given on menopausal sx's and nonhormonal tx. Can discuss with her OBGYN as well.   Mixed hyperlipidemia - Plan: Comprehensive metabolic panel, Lipid panel  - check labs. Minimal stain use. Consider lipid clinic eval if still significant elevation.   Hypothyroidism, unspecified type - Plan: TSH, levothyroxine (EUTHYROX) 112 MCG tablet, potassium chloride (KLOR-CON) 10 MEQ tablet  -  Stable, tolerating current regimen. Medications refilled. Labs pending as above.   Low back pain with bilateral sciatica, unspecified back pain  laterality, unspecified chronicity - Plan: methocarbamol (ROBAXIN) 750 MG tablet, celecoxib (CELEBREX) 200 MG capsule Degeneration of lumbar or lumbosacral  intervertebral disc - Plan: methocarbamol (ROBAXIN) 750 MG tablet, celecoxib (CELEBREX) 200 MG capsule  - continue robaxin, celebrex lowest effective dose. Recommended to consider injection as recommended by back specialist if persistent symptoms.   Essential hypertension, benign - Plan: hydrochlorothiazide (HYDRODIURIL) 25 MG tablet  Stable, tolerating current regimen. Medications refilled. Labs pending as above.   Insomnia, unspecified type - Plan: traZODone (DESYREL) 50 MG tablet  -The current medical regimen is effective;  continue present plan and medications.   Meds ordered this encounter  Medications  . levothyroxine (EUTHYROX) 112 MCG tablet    Sig: TAKE 1 TABLET BY MOUTH ONCE DAILY BEFORE BREAKFAST    Dispense:  90 tablet    Refill:  1  . potassium chloride (KLOR-CON) 10 MEQ tablet    Sig: Take 1 tablet (10 mEq total) by mouth 2 (two) times daily.    Dispense:  180 tablet    Refill:  1  . hydrochlorothiazide (HYDRODIURIL) 25 MG tablet    Sig: Take 1 tablet (25 mg total) by mouth daily.    Dispense:  90 tablet    Refill:  3  . traZODone (DESYREL) 50 MG tablet    Sig: Take 0.5-1 tablets (25-50 mg total) by mouth at bedtime as needed for sleep.    Dispense:  90 tablet    Refill:  1  . methocarbamol (ROBAXIN) 750 MG tablet    Sig: Take 1 tablet (750 mg total) by mouth every 8 (eight) hours as needed for muscle spasms.    Dispense:  120 tablet    Refill:  1  . celecoxib (CELEBREX) 200 MG capsule    Sig: Take 1 capsule (200 mg total) by mouth 2 (two) times daily.    Dispense:  180 capsule    Refill:  0   Patient Instructions   See handout on hot flushes, but I would recommend discussing options further with your OB/GYN if hormone replacement therapy not an option.  I will repeat thyroid test today.  No medication changes for now.  I will recheck cholesterol levels, and if those are still significantly elevated we may need to meet with a lipid specialist to decide on  different medicines if not able to tolerate higher dose of statin.  For back pain, I would consider injection as previously recommended by back specialist or I am happy to refer you to different back specialist if you would prefer.  I recommend limiting celecoxib to no more than 200 mg/day.  Muscle relaxant refilled if needed for now.  Recheck in 3 months.  Recheck 3 months. Return to the clinic or go to the nearest emergency room if any of your symptoms worsen or new symptoms occur.     If you have lab work done today you will be contacted with your lab results within the next 2 weeks.  If you have not heard from [place] then please contact us. The fastest way to get your results is to register for My Chart.   IF you received an x-ray today, you will receive an invoice from [facility]. Please contact [facility] at [phone] with questions or concerns regarding your invoice.   IF you received labwork today, you will receive an invoice from [place]. Please contact [facility] at [phone] with questions or concerns regarding your invoice.   Our billing staff will not be able to assist  you with questions regarding bills from these companies.  You will be contacted with the lab results as soon as they are available. The fastest way to get your results is to activate your My Chart account. Instructions are located on the last page of this paperwork. If you have not heard from [place] regarding the results in 2 weeks, please contact this office.          Signed, Merri Ray, MD [facility]

## 2020-01-24 NOTE — Patient Instructions (Addendum)
See handout on hot flushes, but I would recommend discussing options further with your OB/GYN if hormone replacement therapy not an option.  I will repeat thyroid test today.  No medication changes for now.  I will recheck cholesterol levels, and if those are still significantly elevated we may need to meet with a lipid specialist to decide on different medicines if not able to tolerate higher dose of statin.  For back pain, I would consider injection as previously recommended by back specialist or I am happy to refer you to different back specialist if you would prefer.  I recommend limiting celecoxib to no more than 200 mg/day.  Muscle relaxant refilled if needed for now.  Recheck in 3 months.  Recheck 3 months. Return to the clinic or go to the nearest emergency room if any of your symptoms worsen or new symptoms occur.     If you have lab work done today you will be contacted with your lab results within the next 2 weeks.  If you have not heard from [place] then please contact us. The fastest way to get your results is to register for My Chart.   IF you received an x-ray today, you will receive an invoice from [facility]. Please contact [facility] at [phone] with questions or concerns regarding your invoice.   IF you received labwork today, you will receive an invoice from [place]. Please contact [facility] at [phone] with questions or concerns regarding your invoice.   Our billing staff will not be able to assist you with questions regarding bills from these companies.  You will be contacted with the lab results as soon as they are available. The fastest way to get your results is to activate your My Chart account. Instructions are located on the last page of this paperwork. If you have not heard from [place] regarding the results in 2 weeks, please contact this office.

## 2020-01-25 ENCOUNTER — Encounter: Payer: Self-pay | Admitting: Family Medicine

## 2020-01-25 LAB — COMPREHENSIVE METABOLIC PANEL
ALT: 25 IU/L (ref 0–32)
AST: 17 IU/L (ref 0–40)
Albumin/Globulin Ratio: 2 (ref 1.2–2.2)
Albumin: 4.6 g/dL (ref 3.8–4.8)
Alkaline Phosphatase: 77 IU/L (ref 39–117)
BUN/Creatinine Ratio: 24 (ref 12–28)
BUN: 19 mg/dL (ref 8–27)
Bilirubin Total: 0.3 mg/dL (ref 0.0–1.2)
CO2: 24 mmol/L (ref 20–29)
Calcium: 10.2 mg/dL (ref 8.7–10.3)
Chloride: 100 mmol/L (ref 96–106)
Creatinine, Ser: 0.79 mg/dL (ref 0.57–1.00)
GFR calc Af Amer: 91 mL/min/{1.73_m2} (ref >59)
GFR calc non Af Amer: 79 mL/min/{1.73_m2} (ref >59)
Globulin, Total: 2.3 g/dL (ref 1.5–4.5)
Glucose: 101 mg/dL — ABNORMAL HIGH (ref 65–99)
Potassium: 4.7 mmol/L (ref 3.5–5.2)
Sodium: 141 mmol/L (ref 134–144)
Total Protein: 6.9 g/dL (ref 6.0–8.5)

## 2020-01-25 LAB — LIPID PANEL
Chol/HDL Ratio: 5.2 ratio — ABNORMAL HIGH (ref 0.0–4.4)
Cholesterol, Total: 193 mg/dL (ref 100–199)
HDL: 37 mg/dL — ABNORMAL LOW (ref >39)
LDL Chol Calc (NIH): 128 mg/dL — ABNORMAL HIGH (ref 0–99)
Triglycerides: 158 mg/dL — ABNORMAL HIGH (ref 0–149)
VLDL Cholesterol Cal: 28 mg/dL (ref 5–40)

## 2020-01-25 LAB — TSH: TSH: 1.18 u[IU]/mL (ref 0.450–4.500)

## 2020-01-29 ENCOUNTER — Encounter: Payer: Self-pay | Admitting: Family Medicine

## 2020-02-01 NOTE — Telephone Encounter (Signed)
Patient has questions in about her after visit summary concerning Cancer. Patient would like to know which test results should she be concerned about.  Please Advise.

## 2020-03-03 ENCOUNTER — Telehealth: Payer: Self-pay | Admitting: Family Medicine

## 2020-03-03 NOTE — Telephone Encounter (Signed)
Copied from [place] [id number]. Topic: General - Other >> Mar 02, 2020  4:28 PM [place]ey [place] A wrote: Patient is requesting a callback from Dr. Inetta Fermo nurse in regards to the current pain medication she is taking is not assisting her and she is wanting to know if an alternative can be called in for her. Please advise

## 2020-03-29 ENCOUNTER — Encounter: Payer: Self-pay | Admitting: Family Medicine

## 2020-03-30 NOTE — Telephone Encounter (Signed)
Per last visit there was some concern that OB/GYN had taken her off of hormone replacement due to cancer risk.  I asked that she discuss that with them further as I did not have the specific details.  Has she discussed restarting HRT with her OB/GYN?

## 2020-04-11 ENCOUNTER — Telehealth: Payer: Self-pay | Admitting: Family Medicine

## 2020-04-11 NOTE — Telephone Encounter (Signed)
Called pt and LVMTCB for pt to sch hormone replacement therapy appt with Dr. Carlota Raspberry.

## 2020-04-25 ENCOUNTER — Ambulatory Visit: Admitting: Family Medicine

## 2020-04-26 ENCOUNTER — Ambulatory Visit: Admitting: Family Medicine

## 2020-05-14 ENCOUNTER — Other Ambulatory Visit: Payer: Self-pay | Admitting: Family Medicine

## 2020-05-14 DIAGNOSIS — M5137 Other intervertebral disc degeneration, lumbosacral region: Secondary | ICD-10-CM

## 2020-05-14 DIAGNOSIS — M5441 Lumbago with sciatica, right side: Secondary | ICD-10-CM

## 2020-05-29 ENCOUNTER — Ambulatory Visit: Admitting: Family Medicine

## 2020-05-31 ENCOUNTER — Encounter: Payer: Self-pay | Admitting: Family Medicine

## 2020-06-02 ENCOUNTER — Ambulatory Visit (INDEPENDENT_AMBULATORY_CARE_PROVIDER_SITE_OTHER): Payer: Medicare Other | Admitting: Family Medicine

## 2020-06-02 ENCOUNTER — Encounter: Payer: Self-pay | Admitting: Family Medicine

## 2020-06-02 ENCOUNTER — Other Ambulatory Visit: Payer: Self-pay

## 2020-06-02 VITALS — BP 128/83 | HR 78 | Temp 98.2°F | Ht 68.0 in | Wt 226.6 lb

## 2020-06-02 DIAGNOSIS — F4321 Adjustment disorder with depressed mood: Secondary | ICD-10-CM | POA: Diagnosis not present

## 2020-06-02 DIAGNOSIS — N951 Menopausal and female climacteric states: Secondary | ICD-10-CM

## 2020-06-02 DIAGNOSIS — R002 Palpitations: Secondary | ICD-10-CM

## 2020-06-02 DIAGNOSIS — R7303 Prediabetes: Secondary | ICD-10-CM

## 2020-06-02 DIAGNOSIS — Z23 Encounter for immunization: Secondary | ICD-10-CM | POA: Diagnosis not present

## 2020-06-02 DIAGNOSIS — E039 Hypothyroidism, unspecified: Secondary | ICD-10-CM

## 2020-06-02 MED ORDER — ESCITALOPRAM OXALATE 20 MG PO TABS: 20.0000 mg | ORAL_TABLET | Freq: Every day | ORAL | 3 refills | Status: DC

## 2020-06-02 NOTE — Patient Instructions (Addendum)
   Starting lexapro to help with depression and hot flashes Start taking 1/2 tablet (10mg ) every day at bedtime.  Can increase to 1 tab every night after 4 weeks if needed.  It can make people sleepy so you might not need trazodone.   If you have lab work done today you will be contacted with your lab results within the next 2 weeks.  If you have not heard from [place] then please contact us. The fastest way to get your results is to register for My Chart.   IF you received an x-ray today, you will receive an invoice from [facility]. Please contact [facility] at [phone] with questions or concerns regarding your invoice.   IF you received labwork today, you will receive an invoice from [place]. Please contact [facility] at [phone] with questions or concerns regarding your invoice.   Our billing staff will not be able to assist you with questions regarding bills from these companies.  You will be contacted with the lab results as soon as they are available. The fastest way to get your results is to activate your My Chart account. Instructions are located on the last page of this paperwork. If you have not heard from [place] regarding the results in 2 weeks, please contact this office.

## 2020-06-02 NOTE — Progress Notes (Signed)
9/17/202110:47 AM  Natalie Eaton [birth year], 64 y.o., female [id number]  Chief Complaint  Patient presents with  . Hot Flashes    has been dealing with this for 6 months, gyn took her off of the estrogen told it causes cancer. told to take black cohosh, that is not working. Wants tsh and other hormone tested.   . Depression    phq 9 completed    HPI:   Patient is a 64 y.o. female who presents today for hot flashes  Patient having worsening sweating stopped estrogen by gyn about 6 months Get lightheaded and tingling and then breaks out in sweats, lasts minutes Happens day and night Black coash not helping No chest pain or SOB Denies any nausea, vomiting, diarrhea Denies any changes to skin or hair She is having a bit more depression, struggling with current life stressors, phq9 noted She does take levo 171mcg daily She does have palpitations but not aw hot flashes, worse at night but also happen at night She would like to see a cardiologist as their is strong fhx of arrhthymias and valvular disease She has checked her cbgs during these time and it has been fine She is taking gabapentin at bedtime - not helping with hotflashes nor back/neck pain She does not snore, does not sleep well 2/2 pain, trazadone helps fall asleep but not maintain She is seeing pain mgt - rx T#3   Depression screen [facility] 2/9 06/02/2020 01/24/2020 08/26/2019  Decreased Interest 3 0 0  Down, Depressed, Hopeless 2 1 0  PHQ - 2 Score 5 1 0  Altered sleeping 3 - -  Tired, decreased energy 3 - -  Change in appetite 3 - -  Feeling bad or failure about yourself  0 - -  Trouble concentrating 0 - -  Moving slowly or fidgety/restless 0 - -  Suicidal thoughts 0 - -  PHQ-9 Score 14 - -    Fall Risk  06/02/2020 01/24/2020 08/26/2019 08/02/2019 07/19/2019  Falls in the past year? 0 0 0 0 0  Number falls in past yr: 0 0 0 0 0  Injury with Fall? 0 0 0 0 0  Risk for fall due to : Impaired mobility - - - -    Follow up - Falls evaluation completed Falls evaluation completed Falls evaluation completed Falls evaluation completed     Allergies  Allergen Reactions  . Codeine Other (See Comments)    Tingling on her scalp.    Prior to Admission medications   Medication Sig Start Date End Date Taking? Authorizing Provider  aspirin 81 MG chewable tablet Chew 81 mg by mouth daily.    Yes [provider]  atorvastatin (LIPITOR) 40 MG tablet Take 1 tablet (40 mg total) by mouth daily. Patient taking differently: Take 13 mg by mouth daily. 1/3 of a dose once a week 06/15/19  Yes Rutherford Guys, MD  celecoxib (CELEBREX) 200 MG capsule Take 1 capsule (200 mg total) by mouth 2 (two) times daily. 01/24/20  Yes Wendie Agreste, MD  cyclobenzaprine (FLEXERIL) 10 MG tablet Take 10 mg by mouth 3 (three) times daily as needed for muscle spasms.   Yes [provider]  diclofenac sodium (VOLTAREN) 1 % GEL Apply 2 g topically 4 (four) times daily. Patient taking differently: Apply 2 g topically as needed.  02/17/19  Yes Rutherford Guys, MD  gabapentin (NEURONTIN) 300 MG capsule TAKE 2 CAPSULES BY MOUTH AT BEDTIME 05/14/20  Yes Grant Fontana  M, MD  hydrochlorothiazide (HYDRODIURIL) 25 MG tablet Take 1 tablet (25 mg total) by mouth daily. 01/24/20  Yes Wendie Agreste, MD  levothyroxine (EUTHYROX) 112 MCG tablet TAKE 1 TABLET BY MOUTH ONCE DAILY BEFORE BREAKFAST 01/24/20  Yes Wendie Agreste, MD  methocarbamol (ROBAXIN) 750 MG tablet Take 1 tablet (750 mg total) by mouth every 8 (eight) hours as needed for muscle spasms. 01/24/20  Yes Wendie Agreste, MD  Multiple Vitamins-Minerals (HAIR SKIN AND NAILS FORMULA PO) Take 1 tablet by mouth daily.   Yes [provider]  potassium chloride (KLOR-CON) 10 MEQ tablet Take 1 tablet (10 mEq total) by mouth 2 (two) times daily. 01/24/20  Yes Wendie Agreste, MD  traZODone (DESYREL) 50 MG tablet Take 0.5-1 tablets (25-50 mg total) by mouth at bedtime  as needed for sleep. 01/24/20  Yes Wendie Agreste, MD  TURMERIC PO Take by mouth. Qunol brand   Yes [provider]  valACYclovir (VALTREX) 500 MG tablet Take 500 mg by mouth daily. 06/01/18  Yes [provider]  vitamin E 400 UNIT capsule Take 400 Units by mouth 2 (two) times daily.   Yes [provider]    Past Medical History:  Diagnosis Date  . Back pain   . Closed TBI (traumatic brain injury) ([place])   . Coronary artery disease   . Epilepsy ([place])   . History of hysterectomy   . 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 Hypertension   . Hypothyroidism   . Insomnia   . MVP (mitral valve prolapse)   . Pneumonia   . Pre-diabetes     Past Surgical History:  Procedure Laterality Date  . ABDOMINAL HYSTERECTOMY    . BLADDER SURGERY    . CARDIAC CATHETERIZATION    . GALLBLADDER SURGERY    . LUNG REMOVAL, PARTIAL    . ROTATOR CUFF REPAIR    . TONSILLECTOMY      Social History   Tobacco Use  . Smoking status: Former Research scientist (life sciences)  . Smokeless tobacco: Never Used  Substance Use Topics  . Alcohol use: Yes    Alcohol/week: 1.0 standard drink    Types: 1 Glasses of wine per week    Family History  Problem Relation Age of Onset  . Healthy Mother   . Heart disease Brother   . Healthy Daughter   . Healthy Son     ROS Per hpi  OBJECTIVE:  Today's Vitals   06/02/20 0956  BP: 128/83  Pulse: 78  Temp: 98.2 F (36.8 C)  SpO2: 97%  Weight: 226 lb 9.6 oz (102.8 kg)  Height: 5\' 8"  (1.727 m)   Body mass index is 34.45 kg/m.   Physical Exam Vitals and nursing note reviewed.  Constitutional:      Appearance: She is well-developed.  HENT:     Head: Normocephalic and atraumatic.     Mouth/Throat:     Pharynx: No oropharyngeal exudate.  Eyes:     General: No scleral icterus.    Extraocular Movements: Extraocular movements intact.     Conjunctiva/sclera: Conjunctivae normal.     Pupils: Pupils are equal, round,  and reactive to light.  Neck:     Thyroid: No thyroid mass, thyromegaly or thyroid tenderness.  Cardiovascular:     Rate and Rhythm: Normal rate and regular rhythm.     Heart sounds: Normal heart sounds. No murmur heard.  No friction rub. No gallop.  Pulmonary:     Effort: Pulmonary effort is normal.     Breath sounds: Normal breath sounds. No wheezing, rhonchi or rales.  Abdominal:     General: Bowel sounds are normal. There is no distension.     Palpations: Abdomen is soft. There is no mass.     Tenderness: There is no abdominal tenderness.  Musculoskeletal:     Cervical back: Neck supple.  Lymphadenopathy:     Cervical: No cervical adenopathy.  Skin:    General: Skin is warm and dry.  Neurological:     Mental Status: She is alert and oriented to person, place, and time.     No results found for this or any previous visit (from the past 24 hour(s)).  No results found.   ASSESSMENT and PLAN  1. Hot flushes, perimenopausal Discussed hot flashes, already on gabapentin 600mg  bedtime. Adding lexapro which will also help with depressed mood. Reviewed r/se/b of new medication   2. Hypothyroidism, unspecified type Checking labs today, medications will be adjusted as needed.  - TSH  3. Need for vaccination - Pneumococcal conjugate vaccine 13-valent IM  4. Adjustment disorder with depressed mood See #1  5. Prediabetes - Hemoglobin A1c  6. Palpitations - CBC - Comprehensive metabolic panel - Ambulatory referral to Cardiology  Other orders - escitalopram (LEXAPRO) 20 MG tablet; Take 1 tablet (20 mg total) by mouth at bedtime.  Return in about 3 months (around 09/01/2020) for DR Carlota Raspberry.    Rutherford Guys, MD Primary Care at [place], [place] [address] Ph.  [phone] Fax [phone]

## 2020-06-03 ENCOUNTER — Encounter: Payer: Self-pay | Admitting: Family Medicine

## 2020-06-03 LAB — CBC
Hematocrit: 40 % (ref 34.0–46.6)
Hemoglobin: 13.5 g/dL (ref 11.1–15.9)
MCH: 31 pg (ref 26.6–33.0)
MCHC: 33.8 g/dL (ref 31.5–35.7)
MCV: 92 fL (ref 79–97)
Platelets: 247 10*3/uL (ref 150–450)
RBC: 4.35 x10E6/uL (ref 3.77–5.28)
RDW: 12.5 % (ref 11.7–15.4)
WBC: 14.8 10*3/uL — ABNORMAL HIGH (ref 3.4–10.8)

## 2020-06-03 LAB — COMPREHENSIVE METABOLIC PANEL
ALT: 49 IU/L — ABNORMAL HIGH (ref 0–32)
AST: 26 IU/L (ref 0–40)
Albumin/Globulin Ratio: 2.3 — ABNORMAL HIGH (ref 1.2–2.2)
Albumin: 4.8 g/dL (ref 3.8–4.8)
Alkaline Phosphatase: 79 IU/L (ref 44–121)
BUN/Creatinine Ratio: 27 (ref 12–28)
BUN: 22 mg/dL (ref 8–27)
Bilirubin Total: 0.2 mg/dL (ref 0.0–1.2)
CO2: 24 mmol/L (ref 20–29)
Calcium: 10 mg/dL (ref 8.7–10.3)
Chloride: 100 mmol/L (ref 96–106)
Creatinine, Ser: 0.82 mg/dL (ref 0.57–1.00)
GFR calc Af Amer: 87 mL/min/{1.73_m2} (ref >59)
GFR calc non Af Amer: 75 mL/min/{1.73_m2} (ref >59)
Globulin, Total: 2.1 g/dL (ref 1.5–4.5)
Glucose: 92 mg/dL (ref 65–99)
Potassium: 4.6 mmol/L (ref 3.5–5.2)
Sodium: 140 mmol/L (ref 134–144)
Total Protein: 6.9 g/dL (ref 6.0–8.5)

## 2020-06-03 LAB — TSH: TSH: 0.93 u[IU]/mL (ref 0.450–4.500)

## 2020-06-03 LAB — HEMOGLOBIN A1C
Est. average glucose Bld gHb Est-mCnc: 128 mg/dL
Hgb A1c MFr Bld: 6.1 % — ABNORMAL HIGH (ref 4.8–5.6)

## 2020-06-07 ENCOUNTER — Other Ambulatory Visit: Payer: Self-pay

## 2020-06-07 ENCOUNTER — Encounter: Payer: Self-pay | Admitting: Cardiology

## 2020-06-07 ENCOUNTER — Ambulatory Visit (INDEPENDENT_AMBULATORY_CARE_PROVIDER_SITE_OTHER): Payer: Medicare Other | Admitting: Cardiology

## 2020-06-07 VITALS — BP 124/76 | HR 71 | Ht 68.0 in | Wt 224.4 lb

## 2020-06-07 DIAGNOSIS — R002 Palpitations: Secondary | ICD-10-CM | POA: Diagnosis not present

## 2020-06-07 DIAGNOSIS — E785 Hyperlipidemia, unspecified: Secondary | ICD-10-CM | POA: Diagnosis not present

## 2020-06-07 DIAGNOSIS — I059 Rheumatic mitral valve disease, unspecified: Secondary | ICD-10-CM

## 2020-06-07 DIAGNOSIS — I10 Essential (primary) hypertension: Secondary | ICD-10-CM

## 2020-06-07 NOTE — Patient Instructions (Addendum)
Medication Instructions:  Your physician recommends that you continue on your current medications as directed. Please refer to the Current Medication list given to you today.  *If you need a refill on your cardiac medications before your next appointment, please call your pharmacy*   Lab Work: None ordered   Testing/Procedures: Your physician has requested that you have an echocardiogram. Echocardiography is a painless test that uses sound waves to create images of your heart. It provides your doctor with information about the size and shape of your heart and how well your heart's chambers and valves are working. This procedure takes approximately one hour. There are no restrictions for this procedure.  Your physician has recommended that you wear a 14 day Zio monitor. These monitors are medical devices that record the heart's electrical activity. Doctors most often use these monitors to diagnose arrhythmias. Arrhythmias are problems with the speed or rhythm of the heartbeat. The monitor is a small, portable device. You can wear one while you do your normal daily activities. This is usually used to diagnose what is causing palpitations/syncope (passing out).   Follow-Up: At [facility], you and your health needs are our priority.  As part of our continuing mission to provide you with exceptional heart care, we have created designated Provider Care Teams.  These Care Teams include your primary Cardiologist (physician) and Advanced Practice Providers (APPs -  Physician Assistants and Nurse Practitioners) who all work together to provide you with the care you need, when you need it.  We recommend signing up for the patient portal called "MyChart".  Sign up information is provided on this After Visit Summary.  MyChart is used to connect with patients for Virtual Visits (Telemedicine).  Patients are able to view lab/test results, encounter notes, upcoming appointments, etc.  Non-urgent messages can  be sent to your provider as well.   To learn more about what you can do with MyChart, go to [email or web address].    Your next appointment:   6 month(s)  The format for your next appointment:   In Person  Provider:   Lars Mage, MD     Other Instructions  [place]- Long Term Monitor Instructions   Your physician has requested you wear your ZIO patch monitor 14 days.   This is a single patch monitor.  [employer] supplies one patch monitor per enrollment.  Additional stickers are not available.   Please do not apply patch if you will be having a Nuclear Stress Test, Echocardiogram, Cardiac CT, MRI, or Chest Xray during the time frame you would be wearing the monitor. The patch cannot be worn during these tests.  You cannot remove and re-apply the ZIO XT patch monitor.   Your ZIO patch monitor will be sent USPS Priority mail from [facility] directly to your home address. The monitor may also be mailed to a PO BOX if home delivery is not available.   It may take 3-5 days to receive your monitor after you have been enrolled.   Once you have received you monitor, please review enclosed instructions.  Your monitor has already been registered assigning a specific monitor serial # to you.   Applying the monitor   Shave hair from upper left chest.   Hold abrader disc by orange tab.  Rub abrader in 40 strokes over left upper chest as indicated in your monitor instructions.   Clean area with 4 enclosed alcohol pads .  Use all pads to assure are is cleaned thoroughly.  Let dry.   Apply patch as indicated in monitor instructions.  Patch will be place under collarbone on left side of chest with arrow pointing upward.   Rub patch adhesive wings for 2 minutes.Remove white label marked "1".  Remove white label marked "2".  Rub patch adhesive wings for 2 additional minutes.   While looking in a mirror, press and release button in center of patch.  A small green light will flash 3-4  times .  This will be your only indicator the monitor has been turned on.     Do not shower for the first 24 hours.  You may shower after the first 24 hours.   Press button if you feel a symptom. You will hear a small click.  Record Date, Time and Symptom in the Patient Log Book.   When you are ready to remove patch, follow instructions on last 2 pages of Patient Log Book.  Stick patch monitor onto last page of Patient Log Book.   Place Patient Log Book in [place] box.  Use locking tab on box and tape box closed securely.  The [employer] and [employer] has [employer] on it.  Please place in mailbox as soon as possible.  Your physician should have your test results approximately 7 days after the monitor has been mailed back to [facility]   Call [place] at [phone] if you have questions regarding your ZIO XT patch monitor.  Call them immediately if you see an orange light blinking on your monitor.   If your monitor falls off in less than 4 days contact our Monitor department at [phone].  If your monitor becomes loose or falls off after 4 days call [employer] at [phone] for suggestions on securing your monitor.

## 2020-06-07 NOTE — Progress Notes (Signed)
Electrophysiology Office Note:    Date:  06/07/2020   ID:  Natalie Eaton, DOB 02-Oct-1955, MRN [id number]  PCP:  Wendie Agreste, MD  [place] Cardiologist:  No primary care provider on file.  [facility] Electrophysiologist:  None   Referring MD: Rutherford Guys, MD   Chief Complaint: Palpitations  History of Present Illness:    Natalie Eaton is a 64 y.o. female who presents for an evaluation of palpitations at the request of Dr. Carlota Raspberry. Their medical history includes hypertension, hypothyroidism, hyperlipidemia, mitral valve prolapse.  She was last seen by her primary care physician, Dr. Pamella Pert, on June 02, 2020.  At that appointment, she reported intermittent palpitations for which she requested to see cardiology.  She has a strong family history of cardiovascular disease.  Past Medical History:  Diagnosis Date  . Back pain   . Closed TBI (traumatic brain injury) ([place])   . Coronary artery disease   . Epilepsy ([place])   . History of hysterectomy   . 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 Hypertension   . Hypothyroidism   . Insomnia   . MVP (mitral valve prolapse)   . Pneumonia   . Pre-diabetes     Past Surgical History:  Procedure Laterality Date  . ABDOMINAL HYSTERECTOMY    . BLADDER SURGERY    . CARDIAC CATHETERIZATION    . GALLBLADDER SURGERY    . LUNG REMOVAL, PARTIAL    . ROTATOR CUFF REPAIR    . TONSILLECTOMY      Current Medications: Current Meds  Medication Sig  . aspirin 81 MG chewable tablet Chew 81 mg by mouth 3 (three) times daily.   Marland Kitchen atorvastatin (LIPITOR) 40 MG tablet Take 20 mg by mouth 2 (two) times a week.  . celecoxib (CELEBREX) 200 MG capsule Take 1 capsule (200 mg total) by mouth 2 (two) times daily.  . cyclobenzaprine (FLEXERIL) 10 MG tablet Take 10 mg by mouth 3 (three) times daily as needed for muscle spasms.  . diclofenac sodium (VOLTAREN) 1 % GEL Apply 2 g topically 4 (four) times  daily.  Marland Kitchen gabapentin (NEURONTIN) 300 MG capsule TAKE 2 CAPSULES BY MOUTH AT BEDTIME  . hydrochlorothiazide (HYDRODIURIL) 25 MG tablet Take 1 tablet (25 mg total) by mouth daily.  Marland Kitchen levothyroxine (EUTHYROX) 112 MCG tablet TAKE 1 TABLET BY MOUTH ONCE DAILY BEFORE BREAKFAST  . methocarbamol (ROBAXIN) 750 MG tablet Take 1 tablet (750 mg total) by mouth every 8 (eight) hours as needed for muscle spasms.  . Multiple Vitamins-Minerals (HAIR SKIN AND NAILS FORMULA PO) Take 1 tablet by mouth daily.  . potassium chloride (KLOR-CON) 10 MEQ tablet Take 1 tablet (10 mEq total) by mouth 2 (two) times daily.  . traZODone (DESYREL) 50 MG tablet Take 0.5-1 tablets (25-50 mg total) by mouth at bedtime as needed for sleep.  . TURMERIC PO Take by mouth. Qunol brand  . valACYclovir (VALTREX) 500 MG tablet Take 500 mg by mouth daily. As needed  . vitamin E 400 UNIT capsule Take 400 Units by mouth 2 (two) times daily.     Allergies:   Codeine   Social History   Socioeconomic History  . Marital status: Widowed    Spouse name: Not on file  . Number of children: 2  . Years of education: Not on file  . Highest education level: Not on file  Occupational History  . Not on file  Tobacco  Use  . Smoking status: Former Smoker  . Smokeless tobacco: Never Used  Vaping Use  . Vaping Use: Never used  Substance and Sexual Activity  . Alcohol use: Yes    Alcohol/week: 1.0 standard drink    Types: 1 Glasses of wine per week  . Drug use: No  . Sexual activity: Not Currently  Other Topics Concern  . Not on file  Social History Narrative  . Not on file   Social Determinants of Health   Financial Resource Strain:   . Difficulty of Paying Living Expenses: Not on file  Food Insecurity:   . Worried About Charity fundraiser in the Last Year: Not on file  . Ran Out of Food in the Last Year: Not on file  Transportation Needs:   . Lack of Transportation (Medical): Not on file  . Lack of Transportation (Non-Medical):  Not on file  Physical Activity:   . Days of Exercise per Week: Not on file  . Minutes of Exercise per Session: Not on file  Stress:   . Feeling of Stress : Not on file  Social Connections:   . Frequency of Communication with Friends and Family: Not on file  . Frequency of Social Gatherings with Friends and Family: Not on file  . Attends Religious Services: Not on file  . Active Member of Clubs or Organizations: Not on file  . Attends Archivist Meetings: Not on file  . Marital Status: Not on file     Family History: The patient's family history includes Healthy in her daughter, mother, and son; Heart disease in her brother.  ROS:   Please see the history of present illness.    All other systems reviewed and are negative.  EKGs/Labs/Other Studies Reviewed:    The following studies were reviewed today: Prior records  EKG:  The ekg ordered today demonstrates sinus rhythm. Poor R wave progression.  Recent Labs: 06/02/2020: ALT 49; BUN 22; Creatinine, Ser 0.82; Hemoglobin 13.5; Platelets 247; Potassium 4.6; Sodium 140; TSH 0.930  Recent Lipid Panel    Component Value Date/Time   CHOL 193 01/24/2020 1122   TRIG 158 (H) 01/24/2020 1122   HDL 37 (L) 01/24/2020 1122   CHOLHDL 5.2 (H) 01/24/2020 1122   LDLCALC 128 (H) 01/24/2020 1122    Physical Exam:    VS:  BP 124/76   Pulse 71   Ht 5\' 8"  (1.727 m)   Wt 224 lb 6.4 oz (101.8 kg)   SpO2 96%   BMI 34.12 kg/m     Wt Readings from Last 3 Encounters:  06/07/20 224 lb 6.4 oz (101.8 kg)  06/02/20 226 lb 9.6 oz (102.8 kg)  01/24/20 206 lb 6.4 oz (93.6 kg)     GEN:  Well nourished, well developed in no acute distress.  Obese HEENT: Normal NECK: No JVD; No carotid bruits LYMPHATICS: No lymphadenopathy CARDIAC: RRR, no murmurs, rubs, gallops RESPIRATORY:  Clear to auscultation without rales, wheezing or rhonchi  ABDOMEN: Soft, non-tender, non-distended MUSCULOSKELETAL:  No edema; No deformity  SKIN: Warm and  dry NEUROLOGIC:  Alert and oriented x 3 PSYCHIATRIC:  Normal affect   ASSESSMENT:    1. Palpitations   2. Essential hypertension   3. Hyperlipidemia, unspecified hyperlipidemia type    PLAN:    In order of problems listed above:  1. Palpitations Discussed the possibility that her palpitations are due to PVCs which can be associated with MVP.  We will check a surface echocardiogram to  assess for structural heart disease and place a 2-week ZIO monitor to assess for any arrhythmias.  #2 hypertension Controlled during today's visit.  Continue home hydrochlorothiazide.  #3 hyperlipidemia Continue home atorvastatin   Medication Adjustments/Labs and Tests Ordered: Current medicines are reviewed at length with the patient today.  Concerns regarding medicines are outlined above.  No orders of the defined types were placed in this encounter.  No orders of the defined types were placed in this encounter.    Signed, Lars Mage, MD, [facility]  06/07/2020 1:54 PM    Electrophysiology [place]

## 2020-06-19 ENCOUNTER — Telehealth: Payer: Self-pay | Admitting: Cardiology

## 2020-06-19 DIAGNOSIS — R002 Palpitations: Secondary | ICD-10-CM

## 2020-06-19 NOTE — Telephone Encounter (Signed)
Patient called stating she was suppose to have a event monitor done.  However I did not see an order placed for it.

## 2020-06-19 NOTE — Telephone Encounter (Signed)
lmtcb

## 2020-06-20 NOTE — Telephone Encounter (Signed)
Apologized to pt for not inputting holter orders at last OV. Pt scheduled to stop by the office tomorrow to have 2 week Zio placed.  She will return it when she comes for echocardiogram on 10/28. Pt agreeable to plan.

## 2020-06-21 ENCOUNTER — Ambulatory Visit (INDEPENDENT_AMBULATORY_CARE_PROVIDER_SITE_OTHER): Payer: Medicare Other

## 2020-06-21 ENCOUNTER — Other Ambulatory Visit: Payer: Self-pay

## 2020-06-21 ENCOUNTER — Encounter: Payer: Self-pay | Admitting: Family Medicine

## 2020-06-21 DIAGNOSIS — R002 Palpitations: Secondary | ICD-10-CM | POA: Diagnosis not present

## 2020-06-22 ENCOUNTER — Other Ambulatory Visit: Payer: Self-pay

## 2020-06-22 DIAGNOSIS — E039 Hypothyroidism, unspecified: Secondary | ICD-10-CM

## 2020-06-22 DIAGNOSIS — G4700 Insomnia, unspecified: Secondary | ICD-10-CM

## 2020-06-22 DIAGNOSIS — I10 Essential (primary) hypertension: Secondary | ICD-10-CM

## 2020-06-22 DIAGNOSIS — M5441 Lumbago with sciatica, right side: Secondary | ICD-10-CM

## 2020-06-22 DIAGNOSIS — M5137 Other intervertebral disc degeneration, lumbosacral region: Secondary | ICD-10-CM

## 2020-06-22 MED ORDER — CELECOXIB 200 MG PO CAPS: 200.0000 mg | ORAL_CAPSULE | Freq: Two times a day (BID) | ORAL | 0 refills | Status: DC

## 2020-06-22 MED ORDER — METHOCARBAMOL 750 MG PO TABS: 750.0000 mg | ORAL_TABLET | Freq: Three times a day (TID) | ORAL | 1 refills | Status: DC | PRN

## 2020-06-22 MED ORDER — TRAZODONE HCL 50 MG PO TABS: 25.0000 mg | ORAL_TABLET | Freq: Every evening | ORAL | 1 refills | Status: DC | PRN

## 2020-06-22 MED ORDER — LEVOTHYROXINE SODIUM 112 MCG PO TABS: ORAL_TABLET | ORAL | 1 refills | Status: DC

## 2020-06-22 MED ORDER — POTASSIUM CHLORIDE CRYS ER 10 MEQ PO TBCR: 10.0000 meq | EXTENDED_RELEASE_TABLET | Freq: Two times a day (BID) | ORAL | 1 refills | Status: DC

## 2020-06-22 MED ORDER — HYDROCHLOROTHIAZIDE 25 MG PO TABS: 25.0000 mg | ORAL_TABLET | Freq: Every day | ORAL | 3 refills | Status: DC

## 2020-06-22 NOTE — Telephone Encounter (Signed)
Patient is requesting a refill of the following medications: Requested Prescriptions   Pending Prescriptions Disp Refills  . traZODone (DESYREL) 50 MG tablet 90 tablet 1    Sig: Take 0.5-1 tablets (25-50 mg total) by mouth at bedtime as needed for sleep.  . methocarbamol (ROBAXIN) 750 MG tablet 120 tablet 1    Sig: Take 1 tablet (750 mg total) by mouth every 8 (eight) hours as needed for muscle spasms.  . celecoxib (CELEBREX) 200 MG capsule 180 capsule 0    Sig: Take 1 capsule (200 mg total) by mouth 2 (two) times daily.    Date of patient request: 06/22/2020 Last office visit: 06/02/2020 Date of last refill: 01/24/2020 Last refill amount: 90 tab, 120 tab, 180 capsules Follow up time period per chart: 3 months (08/2020)     Carlota Raspberry pt you saw on 06/02/2020

## 2020-06-23 ENCOUNTER — Encounter: Payer: Self-pay | Admitting: Family Medicine

## 2020-06-23 DIAGNOSIS — I10 Essential (primary) hypertension: Secondary | ICD-10-CM

## 2020-06-23 DIAGNOSIS — M51379 Other intervertebral disc degeneration, lumbosacral region without mention of lumbar back pain or lower extremity pain: Secondary | ICD-10-CM

## 2020-06-23 DIAGNOSIS — M5137 Other intervertebral disc degeneration, lumbosacral region: Secondary | ICD-10-CM

## 2020-06-23 DIAGNOSIS — M5442 Lumbago with sciatica, left side: Secondary | ICD-10-CM

## 2020-06-23 DIAGNOSIS — E039 Hypothyroidism, unspecified: Secondary | ICD-10-CM

## 2020-06-23 DIAGNOSIS — M5441 Lumbago with sciatica, right side: Secondary | ICD-10-CM

## 2020-06-23 MED ORDER — LEVOTHYROXINE SODIUM 112 MCG PO TABS: ORAL_TABLET | ORAL | 1 refills | Status: DC

## 2020-06-23 MED ORDER — CELECOXIB 200 MG PO CAPS: 200.0000 mg | ORAL_CAPSULE | Freq: Two times a day (BID) | ORAL | 0 refills | Status: DC

## 2020-06-23 MED ORDER — POTASSIUM CHLORIDE CRYS ER 10 MEQ PO TBCR: 10.0000 meq | EXTENDED_RELEASE_TABLET | Freq: Two times a day (BID) | ORAL | 1 refills | Status: DC

## 2020-06-23 MED ORDER — HYDROCHLOROTHIAZIDE 25 MG PO TABS: 25.0000 mg | ORAL_TABLET | Freq: Every day | ORAL | 3 refills | Status: DC

## 2020-06-26 ENCOUNTER — Encounter: Payer: Self-pay | Admitting: Family Medicine

## 2020-06-28 ENCOUNTER — Encounter: Payer: Self-pay | Admitting: Family Medicine

## 2020-06-28 ENCOUNTER — Telehealth (HOSPITAL_COMMUNITY): Payer: Self-pay | Admitting: Cardiology

## 2020-06-28 NOTE — Telephone Encounter (Signed)
Patient cancelled echocardiogram and I call her to see if she would like to rescheduled.  Patient declined to do so at this time due to financial issues. She states she may schedule later in the year. Order will be removed from the WQ and we can reinstate the order.

## 2020-06-30 NOTE — Telephone Encounter (Signed)
Called and spoke with pt and explained that [facility] already began processing some medication orders that they were unable to cancel when I had called as they were to far in processing. Pt then understood and agreed

## 2020-07-04 ENCOUNTER — Other Ambulatory Visit: Payer: Self-pay

## 2020-07-04 ENCOUNTER — Ambulatory Visit: Payer: Medicare Other

## 2020-07-04 ENCOUNTER — Telehealth: Payer: Self-pay | Admitting: Family Medicine

## 2020-07-04 DIAGNOSIS — E039 Hypothyroidism, unspecified: Secondary | ICD-10-CM

## 2020-07-04 NOTE — Telephone Encounter (Signed)
Pt came in wanting Hormone labs drawn / no orders found  Please advise

## 2020-07-04 NOTE — Telephone Encounter (Signed)
Tsh added for future blood draw

## 2020-07-05 NOTE — Progress Notes (Signed)
Called pt and pts phone kept ringing, did not get a hold of pt to schedule labs will try again later.

## 2020-07-07 IMAGING — MR MR LUMBAR SPINE W/O CM
4 of 5 series · 23 of 48 positions shown · non-contrast
Comparison: Radiographs of the lumbar spine 05/22/2018

CLINICAL DATA: Low back pain, unspecified back pain laterality,
unspecified chronicity, unspecified whether sciatica present.
Additional history provided: Neck and back pain for 10 years.

EXAM:
MRI LUMBAR SPINE WITHOUT CONTRAST
TECHNIQUE: Multiplanar, multisequence MR imaging of the lumbar spine was
performed. No intravenous contrast was administered.

[Series 4: T1 · sagittal · 4.0mm · 0.55mm/px · 5 of 15 slices shown (1 of 2)]
[im 1/15]
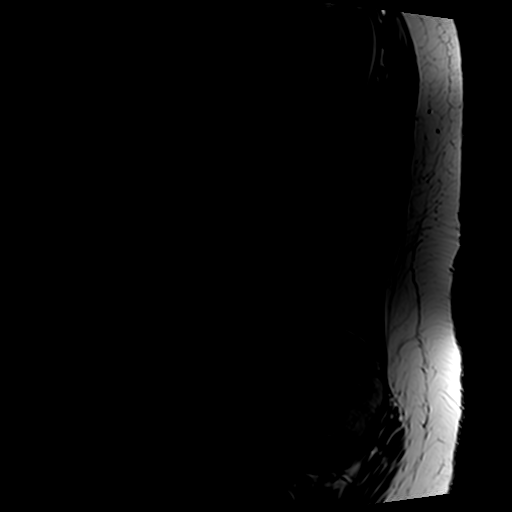
[im 3/15]
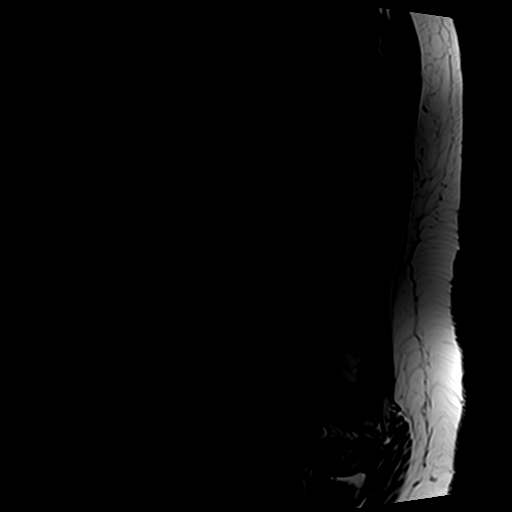
[im 6/15]
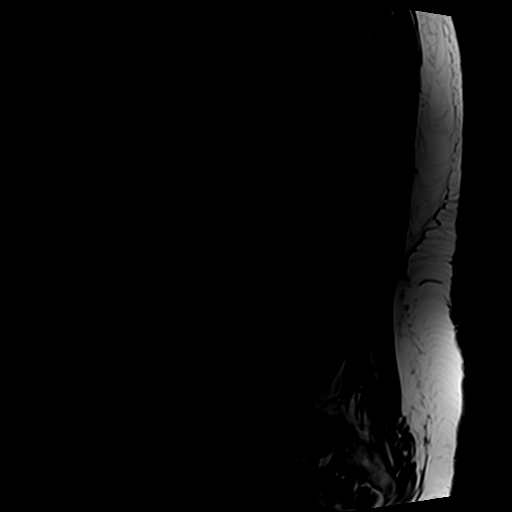
[im 9/15]
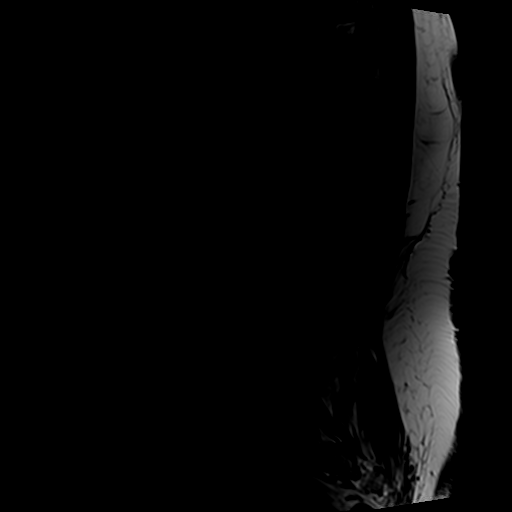
[im 15/15]
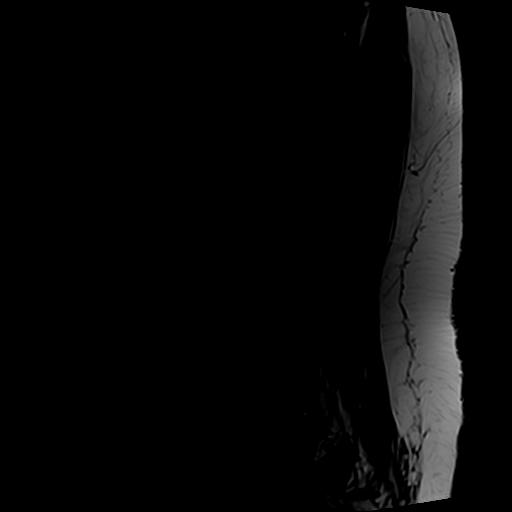

[Series 5: T2 post-contrast · sagittal · 4.0mm · 0.55mm/px · 6 of 15 slices shown]
[im 1/15]
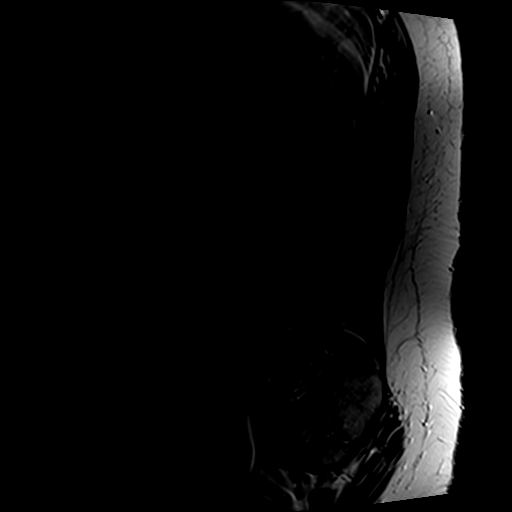
[im 3/15]
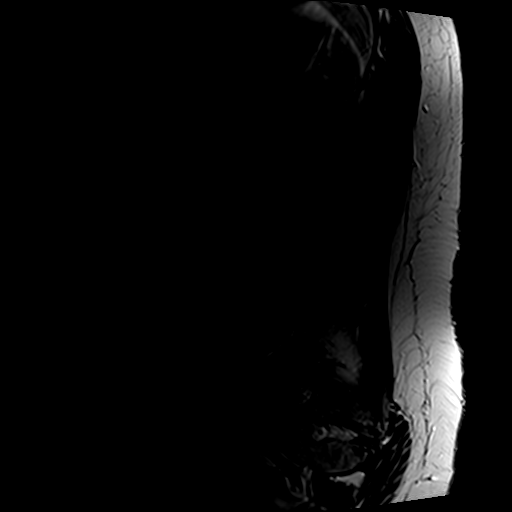
[im 6/15]
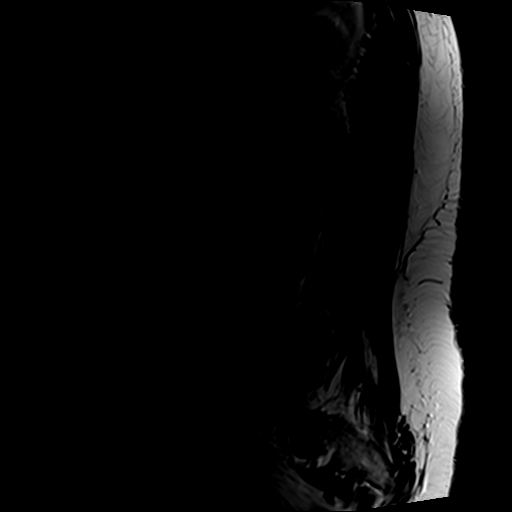
[im 9/15]
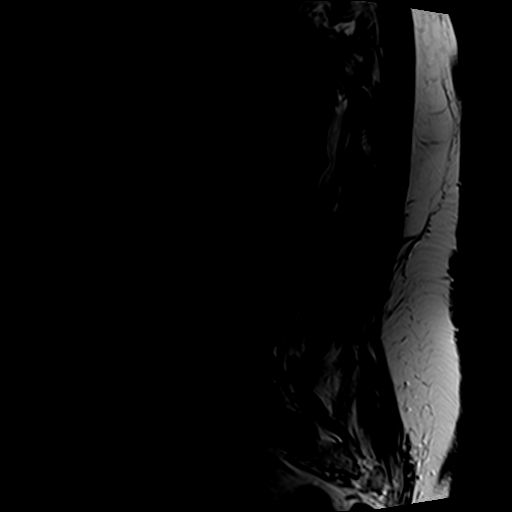
[im 12/15]
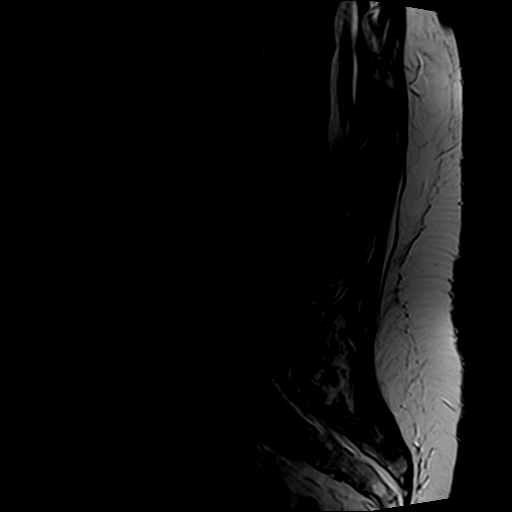
[im 15/15]
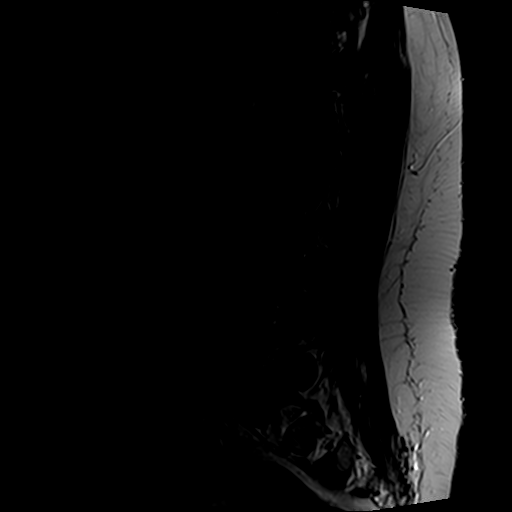

[Series 6: T2 · axial · 4.0mm · 0.70mm/px · z∈[-68,+105]mm · 9 of 39 slices shown]
[im 1/39]
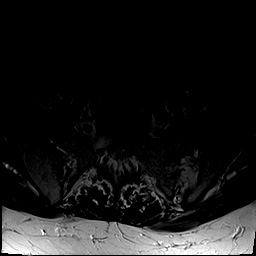
[im 6/39]
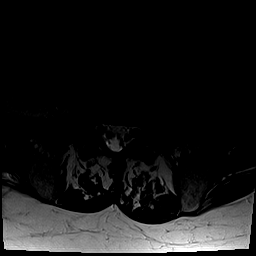
[im 11/39]
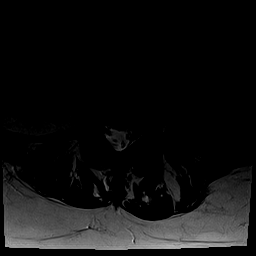
[im 17/39]
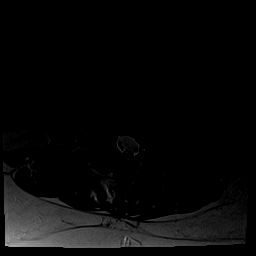
[im 20/39]
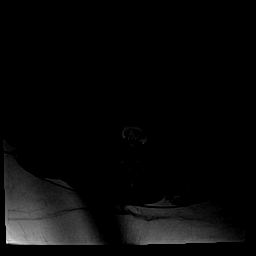
[im 22/39]
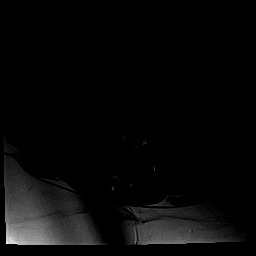
[im 28/39]
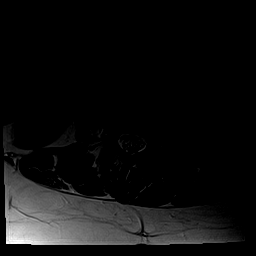
[im 33/39]
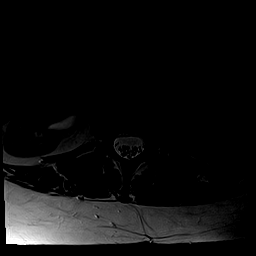
[im 39/39]
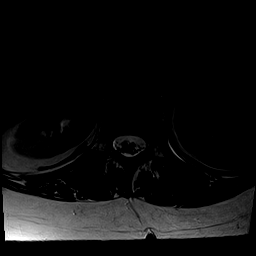

[Series 7: T1 · axial · 4.0mm · 0.35mm/px · z∈[-43,+76]mm · 3 of 39 slices shown (2 of 2)]
[im 6/39]
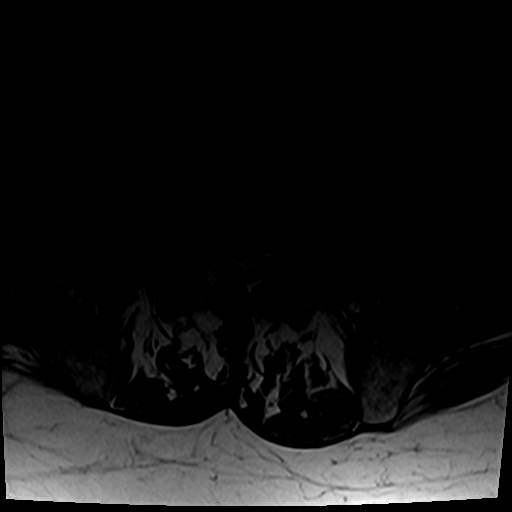
[im 20/39]
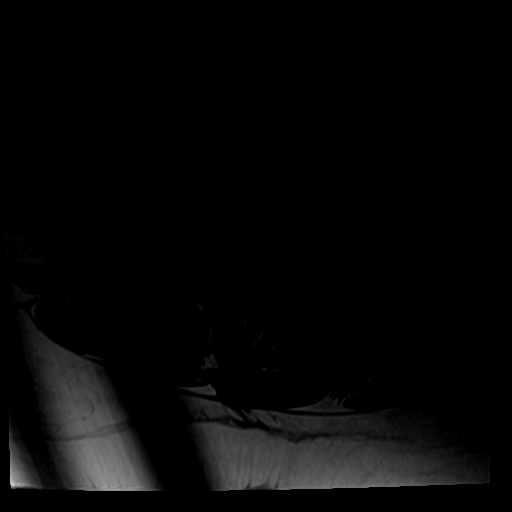
[im 33/39]
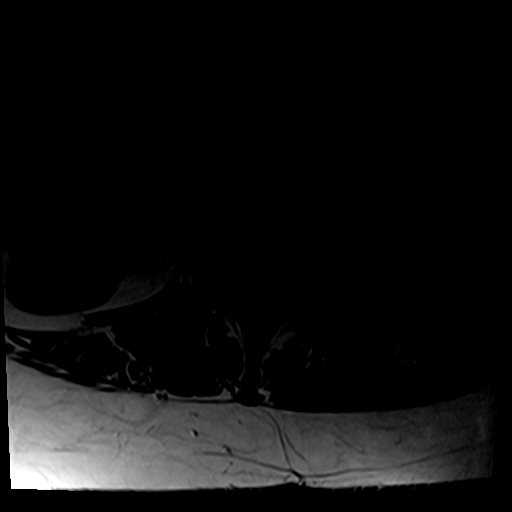

[23 of 48 positions shown; findings below may reference images not displayed]

FINDINGS: Segmentation: 5 lumbar vertebrae correlating with prior lumbar spine
radiographs 05/22/2018

Alignment: Prominent lumbar levocurvature. Grade 1 retrolisthesis at
the L2-L3 through L5-S1 levels, measuring up to 3 mm at the L2-L3
and L5-S1 levels.

Vertebrae: Vertebral body height is maintained. No marrow edema or
suspicious osseous lesion. Mild multilevel degenerative endplate
irregularity and fatty degenerative endplate marrow signal.

Conus medullaris and cauda equina: Conus extends to the L1 level. No
signal abnormality within the visualized distal spinal cord.

Paraspinal and other soft tissues: No abnormality identified within
included portions of the abdomen/retroperitoneum. Atrophy of the
lumbar paraspinal musculature.

Disc levels:

Moderate L2-L3 and L3-L4 disc degeneration. Mild disc degeneration
at the remaining levels.

T12-L1: Facet arthrosis. No significant disc herniation, spinal
canal or neural foraminal narrowing.

L1-L2: Small disc bulge. Superimposed small right center disc
protrusion. Facet arthrosis. No significant spinal canal stenosis or
neural foraminal narrowing.

L2-L3: Grade 1 retrolisthesis. Disc bulge asymmetric to the right.
Associated osteophyte ridge greatest along the right lateral aspect
of the disc space. Facet arthrosis with ligamentum flavum
hypertrophy. Minimal right subarticular narrowing without
significant central canal stenosis. Moderate right neural foraminal
narrowing.

L3-L4: Grade 1 retrolisthesis. Small disc bulge. Associated
osteophyte ridge greatest along the right lateral aspect of the disc
space. Facet arthrosis with ligamentum flavum hypertrophy (greater
on the right). Mild right subarticular narrowing without significant
central canal stenosis. Moderate right with mild left neural
foraminal narrowing.

L4-L5: Trace retrolisthesis. Disc bulge. Superimposed broad-based
central disc protrusion. Endplate spurring. Prominent facet
arthrosis/ligamentum flavum hypertrophy. Bilateral subarticular
narrowing with encroachment upon the descending right L5 nerve root
and slight crowding of the descending left L5 nerve root. No
significant central canal stenosis. Mild right with moderate left
neural foraminal narrowing.

L5-S1: Grade 1 retrolisthesis. Disc bulge asymmetric to the left.
Associated osteophyte ridge greatest along left lateral aspect of
the disc space. Facet arthrosis/ligamentum flavum hypertrophy
(greater on the left). Small amount of asymmetric fluid within the
right facet joint. Mild left subarticular narrowing without
significant central canal stenosis. Moderate left neural foraminal
narrowing.
IMPRESSION: Lumbar spondylosis as outlined.

No significant central canal stenosis at any level.

Multilevel multifactorial subarticular stenosis greatest bilaterally
at L4-L5. At this level, there is encroachment upon the descending
right L5 nerve root and slight crowding of the descending left L5
nerve root.

Sites of mild and moderate neural foraminal narrowing as detailed

Prominent lumbar levocurvature.

## 2020-07-12 ENCOUNTER — Telehealth: Payer: Self-pay | Admitting: Cardiology

## 2020-07-12 DIAGNOSIS — I10 Essential (primary) hypertension: Secondary | ICD-10-CM

## 2020-07-12 DIAGNOSIS — R002 Palpitations: Secondary | ICD-10-CM

## 2020-07-12 NOTE — Telephone Encounter (Signed)
Pt c/o swelling: STAT is pt has developed SOB within 24 hours  1) How much weight have you gained and in what time span? Between 5-10 lbs over a couple of weeks   2) If swelling, where is the swelling located? Swelling in both feet and ankles/ feet are puffy   3) Are you currently taking a fluid pill?  hydrochlorothiazide (HYDRODIURIL) 25 MG tablet  4) Are you currently SOB?  Notice breathing isn't as good as it used to be, only SOB when doing things like walking up a hill   5) Do you have a log of your daily weights (if so, list)?  n/a  6) Have you gained 3 pounds in a day or 5 pounds in a week?  Probably 5 lbs in a week   7) Have you traveled recently?  No  Patient is wondering if Lasix - (furosemide) would work better for her, this is what her doctor in [place] had her on and she feels she had less swelling issues while on it.   Please call/advise.

## 2020-07-12 NOTE — Telephone Encounter (Signed)
Returned call to pt.  Advised per Dr. Linna Hoff would like Pt to complete echo prior to making any medication changes.  Pt was unable to complete echo as scheduled d/t other medical bills.  Advised Pt to complete echo in [place].  Also corrected Pt's insurance so that medicare is billed first and then tricare.    Pt thanked nurse for call.  Echo order placed for [facility] office.

## 2020-07-13 ENCOUNTER — Other Ambulatory Visit (HOSPITAL_COMMUNITY): Payer: Medicare Other

## 2020-07-17 ENCOUNTER — Telehealth: Payer: Self-pay | Admitting: Cardiology

## 2020-07-17 NOTE — Telephone Encounter (Signed)
Pre-cert Verification for the following procedure    ECHO   DATE:  07/21/2020  LOCATION: [facility]

## 2020-07-21 ENCOUNTER — Other Ambulatory Visit: Payer: Self-pay

## 2020-07-21 ENCOUNTER — Ambulatory Visit (HOSPITAL_COMMUNITY)
Admission: RE | Admit: 2020-07-21 | Discharge: 2020-07-21 | Disposition: A | Discharge status: Home / Self Care | Payer: Medicare Other | Source: Ambulatory Visit | Attending: Cardiology | Admitting: Cardiology

## 2020-07-21 DIAGNOSIS — I10 Essential (primary) hypertension: Secondary | ICD-10-CM

## 2020-07-21 DIAGNOSIS — R002 Palpitations: Secondary | ICD-10-CM | POA: Diagnosis present

## 2020-07-21 LAB — ECHOCARDIOGRAM COMPLETE
AR max vel: 2.78 cm2
AV Area VTI: 3.84 cm2
AV Area mean vel: 2.64 cm2
AV Mean grad: 3.6 mmHg
AV Peak grad: 6.7 mmHg
Ao pk vel: 1.3 m/s
Area-P 1/2: 4.49 cm2
S' Lateral: 3.38 cm

## 2020-07-21 NOTE — Progress Notes (Signed)
*  PRELIMINARY RESULTS* Echocardiogram 2D Echocardiogram has been performed.  Natalie Eaton 07/21/2020, 9:18 AM

## 2020-08-11 ENCOUNTER — Ambulatory Visit
Admission: EM | Admit: 2020-08-11 | Discharge: 2020-08-11 | Disposition: A | Discharge status: Home / Self Care | Payer: Medicare Other

## 2020-08-11 ENCOUNTER — Other Ambulatory Visit: Payer: Self-pay

## 2020-08-11 DIAGNOSIS — S61011A Laceration without foreign body of right thumb without damage to nail, initial encounter: Secondary | ICD-10-CM | POA: Diagnosis not present

## 2020-08-11 DIAGNOSIS — Z23 Encounter for immunization: Secondary | ICD-10-CM | POA: Diagnosis not present

## 2020-08-11 DIAGNOSIS — M79644 Pain in right finger(s): Secondary | ICD-10-CM

## 2020-08-11 MED ORDER — TETANUS-DIPHTH-ACELL PERTUSSIS 5-2.5-18.5 LF-MCG/0.5 IM SUSY
0.5000 mL | PREFILLED_SYRINGE | Freq: Once | INTRAMUSCULAR | Status: AC
  Administered 2020-08-11: 0.5 mL via INTRAMUSCULAR

## 2020-08-11 NOTE — Discharge Instructions (Addendum)
We have given you a tetanus vaccine  We have applied glue to your laceration. This will fall off as the wound heals.  Follow up with this office or with primary care for increased swelling, redness, tenderness, warmth, drainage from the area.  Follow up in the ER for red streaking up your hand, high fever, trouble swallowing, trouble breathing, other concerning symptoms.

## 2020-08-11 NOTE — ED Provider Notes (Signed)
[facility]    CSN: [id number] Arrival date & time: 08/11/20  1212      History   Chief Complaint Chief Complaint  Patient presents with  . Laceration    HPI Natalie Eaton is a 64 y.o. female.   Reports that she cut her thumb on a can this morning, reports that she needs to update her tetanus vaccine.  Reports that the bleeding is controlled.  Denies nail damage.  Denies pain at this point.  Denies any drainage from the area.  ROS per HPI   The history is provided by the patient.    Past Medical History:  Diagnosis Date  . Back pain   . Closed TBI (traumatic brain injury) ([place])   . Coronary artery disease   . Epilepsy ([place])   . History of hysterectomy   . 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 Hypertension   . Hypothyroidism   . Insomnia   . MVP (mitral valve prolapse)   . Pneumonia   . Pre-diabetes     Patient Active Problem List   Diagnosis Date Noted  . Arthritis 06/10/2019  . MVP (mitral valve prolapse) 06/10/2019  . Statin intolerance 02/17/2019  . Pure hypercholesterolemia 11/16/2018  . 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 BMI 32.0-32.9,adult 02/24/2018  . Essential hypertension, benign 11/21/2017  . Hypothyroidism 11/21/2017    Past Surgical History:  Procedure Laterality Date  . ABDOMINAL HYSTERECTOMY    . BLADDER SURGERY    . CARDIAC CATHETERIZATION    . GALLBLADDER SURGERY    . LUNG REMOVAL, PARTIAL    . ROTATOR CUFF REPAIR    . TONSILLECTOMY      OB History   No obstetric history on file.      Home Medications    Prior to Admission medications   Medication Sig Start Date End Date Taking? Authorizing Provider  Acetaminophen-Codeine 300-30 MG tablet acetaminophen 300 mg-codeine 30 mg tablet    [provider]  aspirin 81 MG chewable tablet Chew 81 mg by mouth 3 (three) times daily.     [provider]  atorvastatin (LIPITOR) 40 MG tablet Take 20 mg by  mouth 2 (two) times a week.    [provider]  celecoxib (CELEBREX) 200 MG capsule Take 1 capsule (200 mg total) by mouth 2 (two) times daily. 06/23/20   Wendie Agreste, MD  cyclobenzaprine (FLEXERIL) 10 MG tablet Take 10 mg by mouth 3 (three) times daily as needed for muscle spasms.    [provider]  diclofenac sodium (VOLTAREN) 1 % GEL Apply 2 g topically 4 (four) times daily. 02/17/19   Rutherford Guys, MD  escitalopram (LEXAPRO) 20 MG tablet escitalopram 20 mg tablet    [provider]  gabapentin (NEURONTIN) 300 MG capsule TAKE 2 CAPSULES BY MOUTH AT BEDTIME 05/14/20   Rutherford Guys, MD  hydrochlorothiazide (HYDRODIURIL) 25 MG tablet Take 1 tablet (25 mg total) by mouth daily. 06/23/20   Wendie Agreste, MD  levothyroxine (EUTHYROX) 112 MCG tablet TAKE 1 TABLET BY MOUTH ONCE DAILY BEFORE BREAKFAST 06/23/20   Wendie Agreste, MD  methocarbamol (ROBAXIN) 750 MG tablet Take 1 tablet (750 mg total) by mouth every 8 (eight) hours as needed for muscle spasms. 06/22/20   Rutherford Guys, MD  Multiple Vitamins-Minerals (HAIR SKIN AND NAILS FORMULA PO) Take 1 tablet by mouth daily.    [provider]  potassium chloride (KLOR-CON) 10 MEQ tablet Take 1 tablet (10 mEq total) by mouth 2 (two) times daily. 06/23/20   Wendie Agreste, MD  potassium chloride (KLOR-CON) 10 MEQ tablet potassium chloride ER 10 mEq tablet,extended release    [provider]  potassium chloride (KLOR-CON) 10 MEQ tablet Take 10 mEq by mouth 2 (two) times daily. 06/22/20   [provider]  traZODone (DESYREL) 50 MG tablet Take 0.5-1 tablets (25-50 mg total) by mouth at bedtime as needed for sleep. 06/22/20   Rutherford Guys, MD  TURMERIC PO Take by mouth. Qunol brand    [provider]  valACYclovir (VALTREX) 500 MG tablet Take 500 mg by mouth daily. As needed 06/01/18   [provider]  vitamin E 400 UNIT capsule Take 400 Units by mouth 2 (two) times  daily.    [provider]    Family History Family History  Problem Relation Age of Onset  . Healthy Mother   . Heart disease Brother   . Healthy Daughter   . Healthy Son     Social History Social History   Tobacco Use  . Smoking status: Former Research scientist (life sciences)  . Smokeless tobacco: Never Used  Vaping Use  . Vaping Use: Never used  Substance Use Topics  . Alcohol use: Yes    Alcohol/week: 1.0 standard drink    Types: 1 Glasses of wine per week  . Drug use: No     Allergies   Codeine   Review of Systems Review of Systems   Physical Exam Triage Vital Signs ED Triage Vitals  Enc Vitals Group     BP 08/11/20 1400 (!) 146/89     Pulse Rate 08/11/20 1400 78     Resp 08/11/20 1400 19     Temp 08/11/20 1400 98.8 F (37.1 C)     Temp Source 08/11/20 1400 Oral     SpO2 08/11/20 1400 95 %     Weight --      Height --      Head Circumference --      Peak Flow --      Pain Score 08/11/20 1359 0     Pain Loc --      Pain Edu? --      Excl. in [place]? --    No data found.  Updated Vital Signs BP (!) 146/89 (BP Location: Right Arm)   Pulse 78   Temp 98.8 F (37.1 C) (Oral)   Resp 19   SpO2 95%   Visual Acuity Right Eye Distance:   Left Eye Distance:   Bilateral Distance:    Right Eye Near:   Left Eye Near:    Bilateral Near:     Physical Exam Vitals and nursing note reviewed.  Constitutional:      General: She is not in acute distress.    Appearance: Normal appearance. She is well-developed. She is not ill-appearing.  HENT:     Head: Normocephalic and atraumatic.     Mouth/Throat:     Mouth: Mucous membranes are moist.     Pharynx: Oropharynx is clear.  Eyes:     Conjunctiva/sclera: Conjunctivae normal.  Cardiovascular:     Rate and Rhythm: Normal rate and regular rhythm.     Heart sounds: Normal heart sounds. No murmur heard.   Pulmonary:     Effort: Pulmonary effort is normal. No respiratory distress.     Breath sounds: Normal breath sounds.    Abdominal:  General: Bowel sounds are normal.     Palpations: Abdomen is soft.     Tenderness: There is no abdominal tenderness.  Musculoskeletal:        General: Normal range of motion.     Cervical back: Normal range of motion and neck supple.  Skin:    General: Skin is warm and dry.     Capillary Refill: Capillary refill takes less than 2 seconds.     Findings: Laceration present.     Comments: 1cm laceration to tip of R thumb, bleeding controlled, no foreign bodies, no drainage  Neurological:     General: No focal deficit present.     Mental Status: She is alert and oriented to person, place, and time.  Psychiatric:        Mood and Affect: Mood normal.        Behavior: Behavior normal.        Thought Content: Thought content normal.      UC Treatments / Results  Labs (all labs ordered are listed, but only abnormal results are displayed) Labs Reviewed - No data to display  EKG   Radiology No results found.  Procedures Laceration Repair  Date/Time: 08/11/2020 2:29 PM Performed by: Faustino Congress, NP Authorized by: Faustino Congress, NP   Consent:    Consent obtained:  Verbal   Consent given by:  Patient   Risks discussed:  Poor cosmetic result and infection   Alternatives discussed:  No treatment Anesthesia (see MAR for exact dosages):    Anesthesia method:  None Laceration details:    Location:  Finger   Finger location:  R thumb   Length (cm):  1   Depth (mm):  2 Repair type:    Repair type:  Simple Treatment:    Area cleansed with:  Shur-Clens   Amount of cleaning:  Standard Skin repair:    Repair method:  Tissue adhesive Approximation:    Approximation:  Close Post-procedure details:    Dressing:  Open (no dressing)   Patient tolerance of procedure:  Tolerated well, no immediate complications   (including critical care time)  Medications Ordered in UC Medications  Tdap (BOOSTRIX) injection 0.5 mL (0.5 mLs Intramuscular Given  08/11/20 1451)    Initial Impression / Assessment and Plan / UC Course  I have reviewed the triage vital signs and the nursing notes.  Pertinent labs & imaging results that were available during my care of the patient were reviewed by me and considered in my medical decision making (see chart for details).     Laceration Update Tdap R thumb pain  Presents with laceration to the right thumb from a cut on an aluminum can Tdap vaccination updated Wound closed with tissue adhesive Follow-up with this office or with primary care for signs and symptoms of infection including increased redness, tenderness, warmth, heat, drainage from the area Reports to the ER for red streaking up your hand, fever, other concerning symptoms Final Clinical Impressions(s) / UC Diagnoses   Final diagnoses:  Laceration of right thumb without foreign body without damage to nail, initial encounter  Need for prophylactic vaccination using diphtheria, tetanus, and acellular pertussis (DTaP) vaccine  Thumb pain, right     Discharge Instructions     We have given you a tetanus vaccine  We have applied glue to your laceration. This will fall off as the wound heals.  Follow up with this office or with primary care for increased swelling, redness, tenderness, warmth, drainage from the  area.  Follow up in the ER for red streaking up your hand, high fever, trouble swallowing, trouble breathing, other concerning symptoms.      ED Prescriptions    None     PDMP not reviewed this encounter.   Faustino Congress, NP 08/13/20 1013

## 2020-08-11 NOTE — ED Triage Notes (Signed)
Pt is here with a laceration that happened this morning, pt has taken Tylenol to relieve discomfort.

## 2020-08-17 ENCOUNTER — Encounter: Payer: Self-pay | Admitting: Family Medicine

## 2020-08-17 DIAGNOSIS — M5441 Lumbago with sciatica, right side: Secondary | ICD-10-CM

## 2020-08-17 DIAGNOSIS — M5137 Other intervertebral disc degeneration, lumbosacral region: Secondary | ICD-10-CM

## 2020-08-18 MED ORDER — GABAPENTIN 300 MG PO CAPS: 600.0000 mg | ORAL_CAPSULE | Freq: Every day | ORAL | 0 refills | Status: DC

## 2020-08-18 NOTE — Telephone Encounter (Signed)
Patient is requesting a refill of the following medications: Requested Prescriptions   Pending Prescriptions Disp Refills  . gabapentin (NEURONTIN) 300 MG capsule 180 capsule 0    Sig: Take 2 capsules (600 mg total) by mouth at bedtime.    Date of patient request: 08/18/2020 Last office visit: 06/02/2020 [place] Date of last refill: 05/14/2020 Last refill amount: 180 TOC with you 09/01/2020

## 2020-09-01 ENCOUNTER — Ambulatory Visit (INDEPENDENT_AMBULATORY_CARE_PROVIDER_SITE_OTHER): Payer: Medicare Other

## 2020-09-01 ENCOUNTER — Telehealth: Payer: Self-pay | Admitting: Family Medicine

## 2020-09-01 ENCOUNTER — Other Ambulatory Visit: Payer: Self-pay

## 2020-09-01 ENCOUNTER — Ambulatory Visit (INDEPENDENT_AMBULATORY_CARE_PROVIDER_SITE_OTHER): Payer: Medicare Other | Admitting: Family Medicine

## 2020-09-01 VITALS — BP 124/70 | HR 98 | Temp 98.3°F | Ht 68.0 in | Wt 233.0 lb

## 2020-09-01 DIAGNOSIS — M5441 Lumbago with sciatica, right side: Secondary | ICD-10-CM

## 2020-09-01 DIAGNOSIS — M51379 Other intervertebral disc degeneration, lumbosacral region without mention of lumbar back pain or lower extremity pain: Secondary | ICD-10-CM

## 2020-09-01 DIAGNOSIS — R059 Cough, unspecified: Secondary | ICD-10-CM

## 2020-09-01 DIAGNOSIS — M5137 Other intervertebral disc degeneration, lumbosacral region: Secondary | ICD-10-CM | POA: Diagnosis not present

## 2020-09-01 DIAGNOSIS — R0609 Other forms of dyspnea: Secondary | ICD-10-CM

## 2020-09-01 DIAGNOSIS — M5442 Lumbago with sciatica, left side: Secondary | ICD-10-CM

## 2020-09-01 DIAGNOSIS — R6883 Chills (without fever): Secondary | ICD-10-CM

## 2020-09-01 DIAGNOSIS — N951 Menopausal and female climacteric states: Secondary | ICD-10-CM

## 2020-09-01 DIAGNOSIS — R0600 Dyspnea, unspecified: Secondary | ICD-10-CM | POA: Diagnosis not present

## 2020-09-01 DIAGNOSIS — R1010 Upper abdominal pain, unspecified: Secondary | ICD-10-CM

## 2020-09-01 DIAGNOSIS — R1310 Dysphagia, unspecified: Secondary | ICD-10-CM

## 2020-09-01 DIAGNOSIS — R7989 Other specified abnormal findings of blood chemistry: Secondary | ICD-10-CM

## 2020-09-01 DIAGNOSIS — D72829 Elevated white blood cell count, unspecified: Secondary | ICD-10-CM

## 2020-09-01 DIAGNOSIS — R9389 Abnormal findings on diagnostic imaging of other specified body structures: Secondary | ICD-10-CM

## 2020-09-01 LAB — POCT CBC
Granulocyte percent: 48.4 %G (ref 37–80)
HCT, POC: 41.4 % — AB (ref 29–41)
Hemoglobin: 13.8 g/dL (ref 11–14.6)
Lymph, poc: 5.5 — AB (ref 0.6–3.4)
MCH, POC: 30.9 pg (ref 27–31.2)
MCHC: 33.5 g/dL (ref 31.8–35.4)
MCV: 92.4 fL (ref 76–111)
MID (cbc): 0.7 (ref 0–0.9)
MPV: 7.4 fL (ref 0–99.8)
POC Granulocyte: 5.8 (ref 2–6.9)
POC LYMPH PERCENT: 45.6 %L (ref 10–50)
POC MID %: 6 %M (ref 0–12)
Platelet Count, POC: 264 10*3/uL (ref 142–424)
RBC: 4.48 M/uL (ref 4.04–5.48)
RDW, POC: 13.5 %
WBC: 12 10*3/uL — AB (ref 4.6–10.2)

## 2020-09-01 MED ORDER — OMEPRAZOLE 20 MG PO CPDR: 20.0000 mg | DELAYED_RELEASE_CAPSULE | Freq: Every day | ORAL | 1 refills | Status: DC

## 2020-09-01 NOTE — Progress Notes (Signed)
Subjective:  Patient ID: Natalie Eaton, female    DOB: Aug 27, 1956  Age: 64 y.o. MRN: [id number]  CC:  Chief Complaint  Patient presents with  . Follow-up    On hypertension, hyperlipidemia,insomnia, back pain, and hot flashes.pt reports she is still having the hot flashes and states the medication Dr. Pamella Pert gave her for the hot flashes according to the pharmacist isn't for hot flashes. Pt reports some dizziness at times, but no other physical symptoms of hypertension. Pt reports always feeling fatigued, but no other physical symptoms of hypothyroidism.  Marland Kitchen Abdominal Pain    Pt reports having pain in the center of her abdominal area. Pt reports this started a couple weeks ago. Pt states sometimes she feels like food gets stuck in there. Pt has taken pepcid.    HPI Rhemi Balbach Hine presents for   Concerns above. She was seen in September by Dr. Pamella Pert.  Perimenopausal hot flashes Discussed with Dr. Pamella Pert in September.  No change with black cohosh - still taking.  Some intermittent palpitations, not necessarily associated with hot flashes.  Referred to cardiology with family history of arrhythmias, valvular disease.  She had already been taking gabapentin at bedtime, history of chronic pain, and followed by pain management.  Dr. Pamella Pert added Lexapro 20 mg daily in September to help with depressed mood.  Did have component of adjustment disorder with depressed mood. Still having hot flashes. She has been followed by OB/GYN who had stopped hormone replacement therapy due to concerns for cancer- on estradiol until age 57. Has not discussed her recent hot flashes with obgyn.  Less severe hot flushes past few months. Sometimes feels chills at times past few months. No fevers.  Prior leukocytosis with mild absolute lymphocytosis on pathology review in November 2020 Lab Results  Component Value Date   WBC 12.0 (A) 09/01/2020   HGB 13.8 09/01/2020   HCT 41.4 (A) 09/01/2020   MCV 92.4  09/01/2020   PLT 247 06/02/2020    Delayed start of lexapro until this week - did not think was hot flushes (discussed use for mood symptoms). No side effects with half pill initially.    Depression screen [facility] 2/9 09/01/2020 06/02/2020 01/24/2020 08/26/2019 08/02/2019  Decreased Interest 0 3 0 0 0  Down, Depressed, Hopeless 0 2 1 0 0  PHQ - 2 Score 0 5 1 0 0  Altered sleeping - 3 - - -  Tired, decreased energy - 3 - - -  Change in appetite - 3 - - -  Feeling bad or failure about yourself  - 0 - - -  Trouble concentrating - 0 - - -  Moving slowly or fidgety/restless - 0 - - -  Suicidal thoughts - 0 - - -  PHQ-9 Score - 14 - - -     Hypertension with palpitations Cardiology eval September 22.   Hypertension controlled at that visit.Possibility of PVCs which can be associated with her mitral valve prolapse.  ZIO monitor and echo ordered.  Echo was overall normal November 5 per cardiology.  Monitor indicated 6 episodes of SVT, longest episode 14 beats with rare supraventricular, ventricular ectopy.  No new medications started, but if palpitations change or became more frequent, plan for cardiology follow-up to discuss other treatment options. Currently on hydrochlorothiazide 25 mg daily, potassium 10 mEq daily TSH was stable September visit.  Continued on Synthroid 112 mcg daily BP Readings from Last 3 Encounters:  09/01/20 124/70  08/11/20 (!) 146/89  06/07/20 124/76   Lab Results  Component Value Date   CREATININE 0.70 09/01/2020   Lab Results  Component Value Date   TSH 0.930 06/02/2020   Prediabetes Minimal change in A1c at September 17 visit, 5.9-6.1.  No current meds.  Abdominal pain  past few weeks with feeling of food getting stuck in upper chest area at times. Had to regurgitate a few times.  Has tried Pepcid daily past few weeks, tums as needed.   Some nausea after eating at times. Hx of cholecystectomy. Slight elevated ALT of 49 on September 17 labs, increased from  previous reading of 25, AST, alk phos, bilirubin were normal. Upper GI 10 yrs ago - unknown reason[place].  Tired past few weeks. Chills at times past month. Hot flushes as above. No measured fever.  No nsaids, off asa past week, on celebrex daily. No melena/hematochezia.  Rare cough - past few weeks. Short of breath with exertion at times - past few weeks. Some swelling in legs - chronic. Less recently. No chest pain. Less palpitations.   No dysuria, frequency/hematuria.   History of chronic back pain treated by [facility] - Dr. Lynann Bologna. No pain mgt. Would liek eval with Dr. Nelva [facility] at [facility]. with Tylenol #3 as needed, celebrex, she also takes gabapentin 600 mg nightly.   Insomnia Trazodone 25 to 50 mg nightly. 1/2 pill - helping to get to sleep. Pain in back affects at times.      History Patient Active Problem List   Diagnosis Date Noted  . Arthritis 06/10/2019  . MVP (mitral valve prolapse) 06/10/2019  . Statin intolerance 02/17/2019  . Pure hypercholesterolemia 11/16/2018  . 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 BMI 32.0-32.9,adult 02/24/2018  . Essential hypertension, benign 11/21/2017  . Hypothyroidism 11/21/2017   Past Medical History:  Diagnosis Date  . Back pain   . Closed TBI (traumatic brain injury) ([place])   . Coronary artery disease   . Epilepsy ([place])   . History of hysterectomy   . 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 Hypertension   . Hypothyroidism   . Insomnia   . MVP (mitral valve prolapse)   . Pneumonia   . Pre-diabetes    Past Surgical History:  Procedure Laterality Date  . ABDOMINAL HYSTERECTOMY    . BLADDER SURGERY    . CARDIAC CATHETERIZATION    . GALLBLADDER SURGERY    . LUNG REMOVAL, PARTIAL    . ROTATOR CUFF REPAIR    . TONSILLECTOMY     Allergies  Allergen Reactions  . Codeine Other (See Comments)    Tingling on her scalp.   Prior to Admission medications   Medication  Sig Start Date End Date Taking? Authorizing Provider  Acetaminophen-Codeine 300-30 MG tablet acetaminophen 300 mg-codeine 30 mg tablet    [provider]  aspirin 81 MG chewable tablet Chew 81 mg by mouth 3 (three) times daily.     [provider]  atorvastatin (LIPITOR) 40 MG tablet Take 20 mg by mouth 2 (two) times a week.    [provider]  celecoxib (CELEBREX) 200 MG capsule Take 1 capsule (200 mg total) by mouth 2 (two) times daily. 06/23/20   Wendie Agreste, MD  cyclobenzaprine (FLEXERIL) 10 MG tablet Take 10 mg by mouth 3 (three) times daily as needed for muscle spasms.    [provider]  diclofenac sodium (VOLTAREN) 1 % GEL Apply  2 g topically 4 (four) times daily. 02/17/19   Jacelyn Pi, Lilia Argue, MD  escitalopram (LEXAPRO) 20 MG tablet escitalopram 20 mg tablet    [provider]  gabapentin (NEURONTIN) 300 MG capsule Take 2 capsules (600 mg total) by mouth at bedtime. 08/18/20   Wendie Agreste, MD  hydrochlorothiazide (HYDRODIURIL) 25 MG tablet Take 1 tablet (25 mg total) by mouth daily. 06/23/20   Wendie Agreste, MD  levothyroxine (EUTHYROX) 112 MCG tablet TAKE 1 TABLET BY MOUTH ONCE DAILY BEFORE BREAKFAST 06/23/20   Wendie Agreste, MD  methocarbamol (ROBAXIN) 750 MG tablet Take 1 tablet (750 mg total) by mouth every 8 (eight) hours as needed for muscle spasms. 06/22/20   Daleen Squibb, MD  Multiple Vitamins-Minerals [facility] SKIN AND NAILS FORMULA PO) Take 1 tablet by mouth daily.    [provider]  potassium chloride (KLOR-CON) 10 MEQ tablet Take 1 tablet (10 mEq total) by mouth 2 (two) times daily. 06/23/20   Wendie Agreste, MD  potassium chloride (KLOR-CON) 10 MEQ tablet potassium chloride ER 10 mEq tablet,extended release    [provider]  potassium chloride (KLOR-CON) 10 MEQ tablet Take 10 mEq by mouth 2 (two) times daily. 06/22/20   [provider]  traZODone (DESYREL) 50 MG tablet Take 0.5-1  tablets (25-50 mg total) by mouth at bedtime as needed for sleep. 06/22/20   Daleen Squibb, MD  TURMERIC PO Take by mouth. Qunol brand    [provider]  valACYclovir (VALTREX) 500 MG tablet Take 500 mg by mouth daily. As needed 06/01/18   [provider]  vitamin E 400 UNIT capsule Take 400 Units by mouth 2 (two) times daily.    [provider]   Social History   Socioeconomic History  . Marital status: Widowed    Spouse name: Not on file  . Number of children: 2  . Years of education: Not on file  . Highest education level: Not on file  Occupational History  . Not on file  Tobacco Use  . Smoking status: Former Research scientist (life sciences)  . Smokeless tobacco: Never Used  Vaping Use  . Vaping Use: Never used  Substance and Sexual Activity  . Alcohol use: Yes    Alcohol/week: 1.0 standard drink    Types: 1 Glasses of wine per week  . Drug use: No  . Sexual activity: Not Currently    Birth control/protection: None  Other Topics Concern  . Not on file  Social History Narrative  . Not on file   Social Determinants of Health   Financial Resource Strain: Not on file  Food Insecurity: Not on file  Transportation Needs: Not on file  Physical Activity: Not on file  Stress: Not on file  Social Connections: Not on file  Intimate Partner Violence: Not on file    Review of Systems Per HPI  Objective:   Vitals:   09/01/20 0933  BP: 124/70  Pulse: 98  Temp: 98.3 F (36.8 C)  TempSrc: Temporal  SpO2: 97%  Weight: 233 lb (105.7 kg)  Height: _0  (1.727 m)     Physical Exam Vitals reviewed.  Constitutional:      Appearance: She is well-developed and well-nourished.  HENT:     Head: Normocephalic and atraumatic.  Eyes:     Extraocular Movements: EOM normal.     Conjunctiva/sclera: Conjunctivae normal.     Pupils: Pupils are equal, round, and reactive to light.  Neck:  Vascular: No carotid bruit.  Cardiovascular:     Rate and Rhythm: Normal rate  and regular rhythm.     Pulses: Intact distal pulses.     Heart sounds: Normal heart sounds.  Pulmonary:     Effort: Pulmonary effort is normal.     Breath sounds: Normal breath sounds.  Abdominal:     General: There is no distension.     Palpations: Abdomen is soft. There is no pulsatile mass.     Tenderness: There is no abdominal tenderness. There is no guarding or rebound.     Comments: Nontender, but describes epigastric as area of prior discomfort.   Musculoskeletal:     Right lower leg: Edema (trace at ankles, calves nt, neg homan. ) present.     Left lower leg: Edema present.  Skin:    General: Skin is warm and dry.  Neurological:     Mental Status: She is alert and oriented to person, place, and time.  Psychiatric:        Mood and Affect: Mood and affect and mood normal.        Behavior: Behavior normal.    Results for orders placed or performed in visit on 09/01/20  Comprehensive metabolic panel  Result Value Ref Range   Glucose 95 65 - 99 mg/dL   BUN 21 8 - 27 mg/dL   Creatinine, Ser 0.70 0.57 - 1.00 mg/dL   GFR calc non Af Amer 91 >59 mL/min/1.73   GFR calc Af Amer 105 >59 mL/min/1.73   BUN/Creatinine Ratio 30 (H) 12 - 28   Sodium 137 134 - 144 mmol/L   Potassium 4.4 3.5 - 5.2 mmol/L   Chloride 97 96 - 106 mmol/L   CO2 23 20 - 29 mmol/L   Calcium 10.3 8.7 - 10.3 mg/dL   Total Protein 6.9 6.0 - 8.5 g/dL   Albumin 4.4 3.8 - 4.8 g/dL   Globulin, Total 2.5 1.5 - 4.5 g/dL   Albumin/Globulin Ratio 1.8 1.2 - 2.2   Bilirubin Total 0.3 0.0 - 1.2 mg/dL   Alkaline Phosphatase 89 44 - 121 IU/L   AST 25 0 - 40 IU/L   ALT 34 (H) 0 - 32 IU/L  Pro b natriuretic peptide  Result Value Ref Range   NT-Pro BNP 59 0 - 301 pg/mL  POCT CBC  Result Value Ref Range   WBC 12.0 (A) 4.6 - 10.2 K/uL   Lymph, poc 5.5 (A) 0.6 - 3.4   POC LYMPH PERCENT 45.6 10 - 50 %L   MID (cbc) 0.7 0 - 0.9   POC MID % 6.0 0 - 12 %M   POC Granulocyte 5.8 2 - 6.9   Granulocyte percent 48.4 37 - 80  %G   RBC 4.48 4.04 - 5.48 M/uL   Hemoglobin 13.8 11 - 14.6 g/dL   HCT, POC 41.4 (A) 29 - 41 %   MCV 92.4 76 - 111 fL   MCH, POC 30.9 27 - 31.2 pg   MCHC 33.5 31.8 - 35.4 g/dL   RDW, POC 13.5 %   Platelet Count, POC 264 142 - 424 K/uL   MPV 7.4 0 - 99.8 fL   DG Chest 2 View  Result Date: 09/01/2020 CLINICAL DATA:  64 year old female with shortness of breath on exertion. EXAM: CHEST - 2 VIEW COMPARISON:  Chest radiographs 07/23/2018. FINDINGS: Large lung volumes. Chronic thoracolumbar scoliosis. Mediastinal contours are stable and within normal limits. Visualized tracheal air column is within normal limits.  Right upper lobe attenuation of bronchovascular markings and mild chronic architectural distortion. Suspect bullous emphysema. Stable mild right apical pleural thickening. No pneumothorax, pulmonary edema, pleural effusion or acute pulmonary opacity. No acute osseous abnormality identified. Negative visible bowel gas pattern. Stable cholecystectomy clips. 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 No acute cardiopulmonary abnormality identified. Electronically Signed   By: Genevie Ann M.D.   On: 09/01/2020 10:43   54 minutes spent during visit, greater than 50% counseling and assimilation of information, chart review, and discussion of plan.    Assessment & Plan:  Laurelle Skiver Sundeen is a 64 y.o. female . Low back pain with bilateral sciatica, unspecified back pain laterality, unspecified chronicity - Plan: Ambulatory referral to Physical Medicine Rehab Degeneration of lumbar or lumbosacral intervertebral disc - Plan: Ambulatory referral to Physical Medicine Rehab  - refer to PM& R - Dr. Nelva Bush.  Menopausal hot flushes  - recommended GYN follow up if persistent. Continue gabapentin.   Chills - Plan: POCT CBC Leukocytosis, unspecified type - Plan: POCT CBC  - afebrile Repeat CBC with improved WBC recheck next few wewks with rtc precautions.    Upper abdominal pain -  Plan: POCT CBC, Comprehensive metabolic panel, Ambulatory referral to Gastroenterology, omeprazole (PRILOSEC) 20 MG capsule Dysphagia, unspecified type - Plan: Ambulatory referral to Gastroenterology, omeprazole (PRILOSEC) 20 MG capsule Elevated LFTs - Plan: Comprehensive metabolic panel  - start omeprazole refer to GI. RTC/ER precautions.   DOE (dyspnea on exertion) - Plan: POCT CBC, Pro b natriuretic peptide, DG Chest 2 View, Ambulatory referral to Pulmonology Cough - Plan: POCT CBC, Pro b natriuretic peptide, DG Chest 2 View, Ambulatory referralto Pulmonology Abnormal chest x-ray - Plan: Ambulatory referral to Pulmonology  - reassuring BNP. emphysematous changes on CXR. Refer to pulmonary.   Meds ordered this encounter  Medications  . omeprazole (PRILOSEC) 20 MG capsule    Sig: Take 1 capsule (20 mg total) by mouth daily.    Dispense:  30 capsule    Refill:  1   Patient Instructions   If hot flushes are not continuing to decrease, I would recommend discussing other options with your gynecologist if hormone replacement in option.   Start omeprazole daily, I will refer you to gastroenterology for stomach and feeling of food getting stuck. Ok to continue pepcid as well for now.  Return to the clinic or go to the nearest emergency room if any of your symptoms worsen or new symptoms occur.  I placed referral to Dr. Nelva Bush.   X-ray did not indicate any acute infection.  Some changes of possible emphysema were seen.  I would recommend follow-up with pulmonary to discuss that x-ray further as well as further testing. I will refer you.  If any worsening of cough, worsening shortness of breath, or other acute changes be seen here, urgent care or emergency room.  I will let you know if there are concerns based on other blood test today.  Please follow-up with me in the next 2 to 3 weeks to review symptoms further from today, including repeat blood count at that time.  If any measured fevers or  worse symptoms prior, be seen.    Return to the clinic or go to the nearest emergency room if any of your symptoms worsen or new symptoms occur.     If you have lab work done today you will be contacted with your lab results within the next 2 weeks.  If you have not heard from [place]  then please contact us. The fastest way to get your results is to register for My Chart.   IF you received an x-ray today, you will receive an invoice from [facility]. Please contact [facility] at [phone] with questions or concerns regarding your invoice.   IF you received labwork today, you will receive an invoice from [facility]. Please contact [facility] at [phone] with questions or concerns regarding your invoice.   Our billing staff will not be able to assist you with questions regarding bills from these companies.  You will be contacted with the lab results as soon as they are available. The fastest way to get your results is to activate your My Chart account. Instructions are located on the last page of this paperwork. If you have not heard from [place] regarding the results in 2 weeks, please contact this office.         Signed, Merri Ray, MD [facility]

## 2020-09-01 NOTE — Patient Instructions (Addendum)
If hot flushes are not continuing to decrease, I would recommend discussing other options with your gynecologist if hormone replacement in option.   Start omeprazole daily, I will refer you to gastroenterology for stomach and feeling of food getting stuck. Ok to continue pepcid as well for now.  Return to the clinic or go to the nearest emergency room if any of your symptoms worsen or new symptoms occur.  I placed referral to Dr. Nelva Bush.   X-ray did not indicate any acute infection.  Some changes of possible emphysema were seen.  I would recommend follow-up with pulmonary to discuss that x-ray further as well as further testing. I will refer you.  If any worsening of cough, worsening shortness of breath, or other acute changes be seen here, urgent care or emergency room.  I will let you know if there are concerns based on other blood test today.  Please follow-up with me in the next 2 to 3 weeks to review symptoms further from today, including repeat blood count at that time.  If any measured fevers or worse symptoms prior, be seen.    Return to the clinic or go to the nearest emergency room if any of your symptoms worsen or new symptoms occur.     If you have lab work done today you will be contacted with your lab results within the next 2 weeks.  If you have not heard from [place] then please contact us. The fastest way to get your results is to register for My Chart.   IF you received an x-ray today, you will receive an invoice from [facility]. Please contact [facility] at [phone] with questions or concerns regarding your invoice.   IF you received labwork today, you will receive an invoice from [facility]. Please contact [facility] at [phone] with questions or concerns regarding your invoice.   Our billing staff will not be able to assist you with questions regarding bills from these companies.  You will be contacted with the lab results as soon as they are  available. The fastest way to get your results is to activate your My Chart account. Instructions are located on the last page of this paperwork. If you have not heard from [place] regarding the results in 2 weeks, please contact this office.

## 2020-09-01 NOTE — Telephone Encounter (Signed)
Patient no longer using [facility] in [place]. Please send prescription to [facility] at [address] [place], [place] [address] Phone: [phone]  Please advise at [phone]

## 2020-09-02 ENCOUNTER — Encounter: Payer: Self-pay | Admitting: Family Medicine

## 2020-09-02 DIAGNOSIS — R7303 Prediabetes: Secondary | ICD-10-CM

## 2020-09-02 DIAGNOSIS — Z6835 Body mass index (BMI) 35.0-35.9, adult: Secondary | ICD-10-CM

## 2020-09-02 LAB — COMPREHENSIVE METABOLIC PANEL
ALT: 34 IU/L — ABNORMAL HIGH (ref 0–32)
AST: 25 IU/L (ref 0–40)
Albumin/Globulin Ratio: 1.8 (ref 1.2–2.2)
Albumin: 4.4 g/dL (ref 3.8–4.8)
Alkaline Phosphatase: 89 IU/L (ref 44–121)
BUN/Creatinine Ratio: 30 — ABNORMAL HIGH (ref 12–28)
BUN: 21 mg/dL (ref 8–27)
Bilirubin Total: 0.3 mg/dL (ref 0.0–1.2)
CO2: 23 mmol/L (ref 20–29)
Calcium: 10.3 mg/dL (ref 8.7–10.3)
Chloride: 97 mmol/L (ref 96–106)
Creatinine, Ser: 0.7 mg/dL (ref 0.57–1.00)
GFR calc Af Amer: 105 mL/min/{1.73_m2} (ref >59)
GFR calc non Af Amer: 91 mL/min/{1.73_m2} (ref >59)
Globulin, Total: 2.5 g/dL (ref 1.5–4.5)
Glucose: 95 mg/dL (ref 65–99)
Potassium: 4.4 mmol/L (ref 3.5–5.2)
Sodium: 137 mmol/L (ref 134–144)
Total Protein: 6.9 g/dL (ref 6.0–8.5)

## 2020-09-02 LAB — PRO B NATRIURETIC PEPTIDE: NT-Pro BNP: 59 pg/mL (ref 0–301)

## 2020-09-03 ENCOUNTER — Encounter: Payer: Self-pay | Admitting: Family Medicine

## 2020-09-04 NOTE — Telephone Encounter (Signed)
Pt wants to know what diet to "go on" what they should and shouldn't eat. Should we refer to dietician/[place]?

## 2020-09-04 NOTE — Telephone Encounter (Signed)
completed

## 2020-09-06 ENCOUNTER — Telehealth: Payer: Self-pay | Admitting: Family Medicine

## 2020-09-06 NOTE — Telephone Encounter (Signed)
Patient called to ask if MRI results automatically sent over  (performed several months ago). Patient needs copy for an upcoming appointment. Advised patient to contact office where MRI was done.

## 2020-09-13 ENCOUNTER — Encounter: Payer: Self-pay | Admitting: Family Medicine

## 2020-09-25 ENCOUNTER — Ambulatory Visit (INDEPENDENT_AMBULATORY_CARE_PROVIDER_SITE_OTHER): Payer: Medicare Other | Admitting: Family Medicine

## 2020-09-25 ENCOUNTER — Encounter: Payer: Self-pay | Admitting: Family Medicine

## 2020-09-25 ENCOUNTER — Other Ambulatory Visit: Payer: Self-pay

## 2020-09-25 VITALS — BP 122/79 | HR 75 | Temp 98.1°F | Ht 68.0 in | Wt 235.0 lb

## 2020-09-25 DIAGNOSIS — M5137 Other intervertebral disc degeneration, lumbosacral region: Secondary | ICD-10-CM

## 2020-09-25 DIAGNOSIS — Z6835 Body mass index (BMI) 35.0-35.9, adult: Secondary | ICD-10-CM

## 2020-09-25 DIAGNOSIS — D72829 Elevated white blood cell count, unspecified: Secondary | ICD-10-CM

## 2020-09-25 DIAGNOSIS — R1310 Dysphagia, unspecified: Secondary | ICD-10-CM

## 2020-09-25 DIAGNOSIS — E039 Hypothyroidism, unspecified: Secondary | ICD-10-CM

## 2020-09-25 DIAGNOSIS — R1010 Upper abdominal pain, unspecified: Secondary | ICD-10-CM

## 2020-09-25 DIAGNOSIS — N951 Menopausal and female climacteric states: Secondary | ICD-10-CM | POA: Diagnosis not present

## 2020-09-25 NOTE — Progress Notes (Signed)
Subjective:  Patient ID: Natalie Eaton, female    DOB: 06-Dec-1955  Age: 65 y.o. MRN: [id number]  CC:  Chief Complaint  Patient presents with  . Follow-up    In lower back pain, hot flashes, abdominal pain, and DOE. Pt reports som improvements with hot flashes since last OV. Pt reports they kind of come and go and is curious what could be causing them. Pt reports no change in back pain since last OV. Pt is concerned she may become hunched back.Pt reports improvement in abdominal pain since starting new medication last OV. Pt asked about a cortisone shot in her L hip if that was possible. Pt also asked if we can check her thyroid while she's here    HPI Natalie Eaton presents for  Follow-up from few concerns in December.  Postmenopausal hot flushes: Treated with gabapentin, continue same last visit with option to meet with GYN follow-up to discuss HRT if persistent.  Did note some chills last visit with improved WBC on CBC.  WBC decreased from 14.8 in September to 12.0 in December.  Afebrile at that time. Hot flushes seem to be improving, not as bad. Only few episodes since last visit.  Chills have resolved.  trouble losing weight. Referred to nutritionist, but  Only covered one is meeting virtually now and computer may not support. Wt Readings from Last 3 Encounters:  09/25/20 235 lb (106.6 kg)  09/01/20 233 lb (105.7 kg)  06/07/20 224 lb 6.4 oz (101.8 kg)    Lab Results  Component Value Date   WBC 12.0 (A) 09/01/2020   HGB 13.8 09/01/2020   HCT 41.4 (A) 09/01/2020   MCV 92.4 09/01/2020   PLT 247 06/02/2020   Lab Results  Component Value Date   TSH 0.930 06/02/2020    Upper abdominal pain with dysphagia: Discussed at December visit.  Started omeprazole but referred to gastroenterology given dysphagia symptoms, including prior regurgitation.  Improved with omeprazole. Did feel like celery got stuck, but passed. No further regurgitation.   Chronic back pain Discussed last  visit.  Previous patient of Dr. Lynann Bologna at [facility].  Requested evaluation with Dr. Nelva Bush with [facility].  Has used gabapentin, Celebrex daily.  3 (as needed - every few weeks).  appt with Dr. Nelva Bush 1/19. Same pain in low back - sciatica down both sides at times. R leg with getting up and down, both sides to sleep on side - down both legs. Prior steroid injections on side of hip.  ? More humpback recently.  MRI 08/2019: IMPRESSION: Lumbar spondylosis as outlined. No significant central canal stenosis at any level. Multilevel multifactorial subarticular stenosis greatest bilaterally at L4-L5. At this level, there is encroachment upon the descending right L5 nerve root and slight crowding of the descending left L5 nerve root. Sites of mild and moderate neural foraminal narrowing as detailed Prominent lumbar levocurvature.  No bowel or bladder incontinence, no saddle anesthesia, no lower extremity weakness.    Dyspnea on exertion: Emphysematous changes on chest x-ray, reassuring BMP last visit.  Referred to pulmonary. Has appt with pulmonary on 1/24.  Dyspnea with increased activity only, not at rest.  Same cough - every few days. No fever. No changes in cough.   History Patient Active Problem List   Diagnosis Date Noted  . Arthritis 06/10/2019  . MVP (mitral valve prolapse) 06/10/2019  . Statin intolerance 02/17/2019  . Pure hypercholesterolemia 11/16/2018  . 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 BMI 32.0-32.9,adult  02/24/2018  . Essential hypertension, benign 11/21/2017  . Hypothyroidism 11/21/2017   Past Medical History:  Diagnosis Date  . Back pain   . Closed TBI (traumatic brain injury) ([place])   . Coronary artery disease   . Epilepsy ([place])   . History of hysterectomy   . 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 Hypertension   . Hypothyroidism   . Insomnia   . MVP (mitral valve prolapse)   . Pneumonia   .  Pre-diabetes    Past Surgical History:  Procedure Laterality Date  . ABDOMINAL HYSTERECTOMY    . BLADDER SURGERY    . CARDIAC CATHETERIZATION    . GALLBLADDER SURGERY    . LUNG REMOVAL, PARTIAL    . ROTATOR CUFF REPAIR    . TONSILLECTOMY     Allergies  Allergen Reactions  . Codeine Other (See Comments)    Tingling on her scalp.   Prior to Admission medications   Medication Sig Start Date End Date Taking? Authorizing Provider  Acetaminophen-Codeine 300-30 MG tablet acetaminophen 300 mg-codeine 30 mg tablet   Yes [provider]  aspirin 81 MG chewable tablet Chew 81 mg by mouth daily.   Yes [provider]  atorvastatin (LIPITOR) 40 MG tablet Take 20 mg by mouth daily.   Yes [provider]  celecoxib (CELEBREX) 200 MG capsule Take 1 capsule (200 mg total) by mouth 2 (two) times daily. 06/23/20  Yes Wendie Agreste, MD  cyclobenzaprine (FLEXERIL) 10 MG tablet Take 10 mg by mouth 3 (three) times daily as needed for muscle spasms.   Yes [provider]  escitalopram (LEXAPRO) 20 MG tablet 10 mg daily.   Yes [provider]  gabapentin (NEURONTIN) 300 MG capsule Take 2 capsules (600 mg total) by mouth at bedtime. 08/18/20  Yes Wendie Agreste, MD  hydrochlorothiazide (HYDRODIURIL) 25 MG tablet Take 1 tablet (25 mg total) by mouth daily. 06/23/20  Yes Wendie Agreste, MD  levothyroxine (EUTHYROX) 112 MCG tablet TAKE 1 TABLET BY MOUTH ONCE DAILY BEFORE BREAKFAST 06/23/20  Yes Wendie Agreste, MD  methocarbamol (ROBAXIN) 750 MG tablet Take 1 tablet (750 mg total) by mouth every 8 (eight) hours as needed for muscle spasms. 06/22/20  Yes Jacelyn Pi, Lilia Argue, MD  Multiple Vitamins-Minerals (HAIR SKIN AND NAILS FORMULA PO) Take 1 tablet by mouth daily.   Yes [provider]  omeprazole (PRILOSEC) 20 MG capsule Take 1 capsule (20 mg total) by mouth daily. 09/01/20  Yes Wendie Agreste, MD  potassium chloride (KLOR-CON) 10 MEQ tablet Take  1 tablet (10 mEq total) by mouth 2 (two) times daily. Patient taking differently: Take 10 mEq by mouth daily. 06/23/20  Yes Wendie Agreste, MD  traZODone (DESYREL) 50 MG tablet Take 0.5-1 tablets (25-50 mg total) by mouth at bedtime as needed for sleep. 06/22/20  Yes Jacelyn Pi, Lilia Argue, MD  TURMERIC PO Take by mouth. Qunol brand   Yes [provider]  valACYclovir (VALTREX) 500 MG tablet Take 500 mg by mouth daily. As needed 06/01/18  Yes [provider]  vitamin E 400 UNIT capsule Take 400 Units by mouth daily.   Yes [provider]  diclofenac sodium (VOLTAREN) 1 % GEL Apply 2 g topically 4 (four) times daily. Patient not taking: Reported on 09/25/2020 02/17/19   Jacelyn Pi, Lilia Argue, MD   Social History   Socioeconomic History  . Marital status: Widowed  Spouse name: Not on file  . Number of children: 2  . Years of education: Not on file  . Highest education level: Not on file  Occupational History  . Not on file  Tobacco Use  . Smoking status: Former Games developermoker  . Smokeless tobacco: Never Used  Vaping Use  . Vaping Use: Never used  Substance and Sexual Activity  . Alcohol use: Yes    Alcohol/week: 1.0 standard drink    Types: 1 Glasses of wine per week  . Drug use: No  . Sexual activity: Not Currently    Birth control/protection: None  Other Topics Concern  . Not on file  Social History Narrative  . Not on file   Social Determinants of Health   Financial Resource Strain: Not on file  Food Insecurity: Not on file  Transportation Needs: Not on file  Physical Activity: Not on file  Stress: Not on file  Social Connections: Not on file  Intimate Partner Violence: Not on file    Review of Systems   Objective:   Vitals:   09/25/20 1550  BP: 122/79  Pulse: 75  Temp: 98.1 F (36.7 C)  TempSrc: Temporal  SpO2: 95%  Weight: 235 lb (106.6 kg)  Height: 5\' 8"  (1.727 m)     Physical Exam Vitals reviewed.  Constitutional:       Appearance: She is well-developed and well-nourished. She is obese.  HENT:     Head: Normocephalic and atraumatic.  Eyes:     Extraocular Movements: EOM normal.     Conjunctiva/sclera: Conjunctivae normal.     Pupils: Pupils are equal, round, and reactive to light.  Neck:     Vascular: No carotid bruit.  Cardiovascular:     Rate and Rhythm: Normal rate and regular rhythm.     Pulses: Intact distal pulses.     Heart sounds: Normal heart sounds. No murmur heard. No gallop.   Pulmonary:     Effort: Pulmonary effort is normal. No respiratory distress.     Breath sounds: Normal breath sounds. No wheezing or rales.  Abdominal:     Palpations: Abdomen is soft. There is no pulsatile mass.     Tenderness: There is no abdominal tenderness.  Musculoskeletal:     Right lower leg: Edema (trace pedal bilat. ) present.     Left lower leg: Edema present.     Comments: R hip - slight ttp troch bursa.   Skin:    General: Skin is warm and dry.  Neurological:     Mental Status: She is alert and oriented to person, place, and time.  Psychiatric:        Mood and Affect: Mood and affect normal.        Behavior: Behavior normal.      Assessment & Plan:  Natalie Eaton is a 65 y.o. female . Leukocytosis, unspecified type - Plan: CBC Menopausal hot flushes  - chills resolved, no fever. Repeat CBC.   - if hot flushes persist, GYN follow up recommended. Continue gabapentin   BMI 35.0-35.9,adult  - continue to watch diet, exercise somewhat limited with back issues. nutrition referral placed prior - waiting for in person eval.   Degeneration of lumbar or lumbosacral intervertebral disc  -follow up planned with Dr. Ethelene Halamos soon. May have component of R trochanteric bursitis - injection deferred at present. Handout given.   Upper abdominal pain Dysphagia, unspecified type  - improved, continue PPI, but still recommended GI follow up, # provided.  Hypothyroidism, unspecified type - Plan: TSH  -  repeat tsh with weight gain. Stable prior.   No orders of the defined types were placed in this encounter.  Patient Instructions    Continue omeprazole daily, but follow up with [facility]  - please call  [phone] to schedule.   Keep follow-up with pulmonary specialist as well as Dr. Nelva Bush as planned.  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  That is an area that may benefit from injection but can be discussed with Dr. Nelva Bush at your upcoming visit.  I will check thyroid test and blood counts again.  Follow-up with me in 6 weeks.  Sooner if any new or worsening symptoms   Hip Bursitis  Hip bursitis is the inflammation of one or more bursae in the hip joint. Bursae are small fluid-filled sacs that absorb shock and prevent bones from rubbing against each other. Hip bursitis can cause mild to moderate pain, and symptoms often come and go over time. What are the causes? This condition results from increased friction between the hip bones and the tendons around the hip joint. This condition can happen if you:  Overuse your hip muscles.  Injure your hip.  Have weak buttocks muscles.  Have bone spurs.  Have an infection. In some cases, the cause may not be known. What increases the risk? You are more likely to develop this condition if:  You injured your hip previously or had hip surgery.  You have a medical condition, such as arthritis, gout, diabetes, or thyroid disease.  You have spine problems.  You have one leg that is shorter than the other.  You participate in athletic activities that include repetitive motion, like running.  You participate in sports where there is a risk of injury or falling, such as football, martial arts, or skiing. What are the signs or symptoms? Symptoms may come and go, and they often include:  Pain in the hip or groin area. Pain may get worse with movement.  Tenderness  and swelling of the hip. In rare cases, the bursa may become infected. If this happens, you may get a fever, as well as warmth and redness in the hip area. How is this diagnosed? This condition may be diagnosed based on:  Your symptoms.  Your medical history.  A physical exam.  Imaging tests, such as: ? X-rays to check your bones. ? MRI or ultrasound to check your tendons and muscles. ? Bone scan.  A biopsy to remove fluid from your inflamed bursa for testing. How is this treated? This condition is treated by resting, icing, applying pressure (compression), and raising (elevating) the injured area. This is called RICE treatment. In some cases, RICE treatment may not be enough to make your symptoms go away. Treatment may also include:  Taking medicine to help with swelling and pain.  Using crutches, a cane, or a walker to decrease the strain on your hip.  Getting a shot of cortisone medicine to help reduce swelling.  Taking other medicines if the bursa is infected.  Draining fluid out of the bursa to help relieve swelling.  Having surgery to remove a damaged or infected bursa. This is rare. Long-term treatment may include:  Physical therapy exercises for strength and flexibility.  Lifestyle changes, such as weight loss, to reduce the strain on the hip. Follow these instructions at home: Managing pain, stiffness, and swelling  If directed, put ice on the  painful area. ? Put ice in a plastic bag. ? Place a towel between your skin and the bag. ? Leave the ice on for 20 minutes, 2-3 times a day.  Raise (elevate) your hip as much as you can without pain. To do this, put a pillow under your hips while you lie down.  If directed, apply heat to the affected area as often as told by your health care provider. Use the heat source that your health care provider recommends, such as a moist heat pack or a heating pad. ? Place a towel between your skin and the heat source. ? Leave  the heat on for 20-30 minutes. ? Remove the heat if your skin turns bright red. This is especially important if you are unable to feel pain, heat, or cold. You may have a greater risk of getting burned.      Activity  Do not use your hip to support your body weight until your health care provider says that you can. Use crutches, a cane, or a walker as told by your health care provider.  If the affected leg is one that you use to drive, ask your health care provider if it is safe to drive.  Rest and protect your hip as much as possible until your pain and swelling get better.  Return to your normal activities as told by your health care provider. Ask your health care provider what activities are safe for you.  Do exercises as told by your health care provider. General instructions  Take over-the-counter and prescription medicines only as told by your health care provider.  Gently massage and stretch your injured area as often as is comfortable.  Wear compression wraps only as told by your health care provider.  If one of your legs is shorter than the other, get fitted for a shoe insert or orthotic.  Maintain a healthy weight. Follow instructions from your health care provider for weight control. These may include dietary restrictions.  Keep all follow-up visits as told by your health care provider. This is important. How is this prevented?  Exercise regularly, as told by your health care provider.  Wear supportive footwear that is appropriate for your sport.  Warm up and stretch before being active. Cool down and stretch after being active.  Take breaks regularly from repetitive activity.  If an activity irritates your hip or causes pain, avoid the activity as much as possible.  Avoid sitting down for long periods at a time. Where to find more information  American Academy of Orthopa[employer] Surgeons: orthoinfo.aaos.org Contact a health care provider if:  You have a  fever.  You develop new symptoms.  You have trouble walking or doing everyday activities.  You have pain that gets worse or does not get better with medicine.  You develop red skin or a feeling of warmth in your hip area. Get help right away if:  You cannot move your hip.  You have severe pain.  You cannot control the muscles in your feet. Summary  Hip bursitis is the inflammation of one or more bursae in the hip joint. Bursae are small fluid-filled sacs that absorb shock and prevent bones from rubbing against each other.  Hip bursitis can cause hip or groin pain, and symptoms often come and go over time.  This condition is often treated by resting, icing, applying pressure (compression), and raising (elevating) the injured area. Other treatments may be needed. This information is not intended to replace advice  given to you by your health care provider. Make sure you discuss any questions you have with your health care provider. Document Revised: 07/05/2019 Document Reviewed: 05/11/2018 Elsevier Patient Education  2021 [employer].    If you have lab work done today you will be contacted with your lab results within the next 2 weeks.  If you have not heard from [place] then please contact us. The fastest way to get your results is to register for My Chart.   IF you received an x-ray today, you will receive an invoice from [facility]. Please contact [facility] at [phone] with questions or concerns regarding your invoice.   IF you received labwork today, you will receive an invoice from [facility]. Please contact [facility] at [phone] with questions or concerns regarding your invoice.   Our billing staff will not be able to assist you with questions regarding bills from these companies.  You will be contacted with the lab results as soon as they are available. The fastest way to get your results is to activate your My Chart account. Instructions are located  on the last page of this paperwork. If you have not heard from [place] regarding the results in 2 weeks, please contact this office.         Signed, Merri Ray, MD [facility]

## 2020-09-25 NOTE — Patient Instructions (Addendum)
Continue omeprazole daily, but follow up with [place] [facility]  - please call  [phone] to schedule.   Keep follow-up with pulmonary specialist as well as Dr. Nelva Bush as planned.  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  That is an area that may benefit from injection but can be discussed with Dr. Nelva Bush at your upcoming visit.  I will check thyroid test and blood counts again.  Follow-up with me in 6 weeks.  Sooner if any new or worsening symptoms   Hip Bursitis  Hip bursitis is the inflammation of one or more bursae in the hip joint. Bursae are small fluid-filled sacs that absorb shock and prevent bones from rubbing against each other. Hip bursitis can cause mild to moderate pain, and symptoms often come and go over time. What are the causes? This condition results from increased friction between the hip bones and the tendons around the hip joint. This condition can happen if you:  Overuse your hip muscles.  Injure your hip.  Have weak buttocks muscles.  Have bone spurs.  Have an infection. In some cases, the cause may not be known. What increases the risk? You are more likely to develop this condition if:  You injured your hip previously or had hip surgery.  You have a medical condition, such as arthritis, gout, diabetes, or thyroid disease.  You have spine problems.  You have one leg that is shorter than the other.  You participate in athletic activities that include repetitive motion, like running.  You participate in sports where there is a risk of injury or falling, such as football, martial arts, or skiing. What are the signs or symptoms? Symptoms may come and go, and they often include:  Pain in the hip or groin area. Pain may get worse with movement.  Tenderness and swelling of the hip. In rare cases, the bursa may become infected. If this happens, you may get a fever, as well as warmth and  redness in the hip area. How is this diagnosed? This condition may be diagnosed based on:  Your symptoms.  Your medical history.  A physical exam.  Imaging tests, such as: ? X-rays to check your bones. ? MRI or ultrasound to check your tendons and muscles. ? Bone scan.  A biopsy to remove fluid from your inflamed bursa for testing. How is this treated? This condition is treated by resting, icing, applying pressure (compression), and raising (elevating) the injured area. This is called RICE treatment. In some cases, RICE treatment may not be enough to make your symptoms go away. Treatment may also include:  Taking medicine to help with swelling and pain.  Using crutches, a cane, or a walker to decrease the strain on your hip.  Getting a shot of cortisone medicine to help reduce swelling.  Taking other medicines if the bursa is infected.  Draining fluid out of the bursa to help relieve swelling.  Having surgery to remove a damaged or infected bursa. This is rare. Long-term treatment may include:  Physical therapy exercises for strength and flexibility.  Lifestyle changes, such as weight loss, to reduce the strain on the hip. Follow these instructions at home: Managing pain, stiffness, and swelling  If directed, put ice on the painful area. ? Put ice in a plastic bag. ? Place a towel between your skin and the bag. ? Leave the ice on for 20 minutes, 2-3 times a day.  Raise (elevate) your  hip as much as you can without pain. To do this, put a pillow under your hips while you lie down.  If directed, apply heat to the affected area as often as told by your health care provider. Use the heat source that your health care provider recommends, such as a moist heat pack or a heating pad. ? Place a towel between your skin and the heat source. ? Leave the heat on for 20-30 minutes. ? Remove the heat if your skin turns bright red. This is especially important if you are unable to  feel pain, heat, or cold. You may have a greater risk of getting burned.      Activity  Do not use your hip to support your body weight until your health care provider says that you can. Use crutches, a cane, or a walker as told by your health care provider.  If the affected leg is one that you use to drive, ask your health care provider if it is safe to drive.  Rest and protect your hip as much as possible until your pain and swelling get better.  Return to your normal activities as told by your health care provider. Ask your health care provider what activities are safe for you.  Do exercises as told by your health care provider. General instructions  Take over-the-counter and prescription medicines only as told by your health care provider.  Gently massage and stretch your injured area as often as is comfortable.  Wear compression wraps only as told by your health care provider.  If one of your legs is shorter than the other, get fitted for a shoe insert or orthotic.  Maintain a healthy weight. Follow instructions from your health care provider for weight control. These may include dietary restrictions.  Keep all follow-up visits as told by your health care provider. This is important. How is this prevented?  Exercise regularly, as told by your health care provider.  Wear supportive footwear that is appropriate for your sport.  Warm up and stretch before being active. Cool down and stretch after being active.  Take breaks regularly from repetitive activity.  If an activity irritates your hip or causes pain, avoid the activity as much as possible.  Avoid sitting down for long periods at a time. Where to find more information  American Academy of Orthopaedic Surgeons: orthoinfo.aaos.org Contact a health care provider if:  You have a fever.  You develop new symptoms.  You have trouble walking or doing everyday activities.  You have pain that gets worse or does not  get better with medicine.  You develop red skin or a feeling of warmth in your hip area. Get help right away if:  You cannot move your hip.  You have severe pain.  You cannot control the muscles in your feet. Summary  Hip bursitis is the inflammation of one or more bursae in the hip joint. Bursae are small fluid-filled sacs that absorb shock and prevent bones from rubbing against each other.  Hip bursitis can cause hip or groin pain, and symptoms often come and go over time.  This condition is often treated by resting, icing, applying pressure (compression), and raising (elevating) the injured area. Other treatments may be needed. This information is not intended to replace advice given to you by your health care provider. Make sure you discuss any questions you have with your health care provider. Document Revised: 07/05/2019 Document Reviewed: 05/11/2018 Elsevier Patient Education  2021 [employer].  If you have lab work done today you will be contacted with your lab results within the next 2 weeks.  If you have not heard from us then please contact us. The fastest way to get your results is to register for My Chart.   IF you received an x-ray today, you will receive an invoice from [facility]. Please contact [facility] at [phone] with questions or concerns regarding your invoice.   IF you received labwork today, you will receive an invoice from [facility]. Please contact [facility] at [phone] with questions or concerns regarding your invoice.   Our billing staff will not be able to assist you with questions regarding bills from these companies.  You will be contacted with the lab results as soon as they are available. The fastest way to get your results is to activate your My Chart account. Instructions are located on the last page of this paperwork. If you have not heard from us regarding the results in 2 weeks, please contact this office.     

## 2020-09-26 LAB — CBC
Hematocrit: 40.8 % (ref 34.0–46.6)
Hemoglobin: 13.7 g/dL (ref 11.1–15.9)
MCH: 30.5 pg (ref 26.6–33.0)
MCHC: 33.6 g/dL (ref 31.5–35.7)
MCV: 91 fL (ref 79–97)
Platelets: 259 10*3/uL (ref 150–450)
RBC: 4.49 x10E6/uL (ref 3.77–5.28)
RDW: 12.6 % (ref 11.7–15.4)
WBC: 15 10*3/uL — ABNORMAL HIGH (ref 3.4–10.8)

## 2020-09-26 LAB — TSH: TSH: 0.139 u[IU]/mL — ABNORMAL LOW (ref 0.450–4.500)

## 2020-10-03 ENCOUNTER — Other Ambulatory Visit: Payer: Self-pay | Admitting: Family Medicine

## 2020-10-03 DIAGNOSIS — E039 Hypothyroidism, unspecified: Secondary | ICD-10-CM

## 2020-10-03 MED ORDER — LEVOTHYROXINE SODIUM 100 MCG PO TABS
ORAL_TABLET | ORAL | 1 refills | Status: DC
Start: 2020-10-03 — End: 2021-03-28

## 2020-10-06 ENCOUNTER — Encounter: Payer: Self-pay | Admitting: Family Medicine

## 2020-10-06 DIAGNOSIS — R059 Cough, unspecified: Secondary | ICD-10-CM

## 2020-10-06 DIAGNOSIS — D72829 Elevated white blood cell count, unspecified: Secondary | ICD-10-CM

## 2020-10-06 NOTE — Telephone Encounter (Signed)
Pt reports needs new Rx for lower done levothyroxine, also thoughts on elevated WBC. Anything to advise?

## 2020-10-07 MED ORDER — AZITHROMYCIN 250 MG PO TABS: ORAL_TABLET | ORAL | 0 refills | Status: DC

## 2020-10-07 NOTE — Telephone Encounter (Signed)
Called pt - some increased cough recently, past week. No fever. No recent prednisone. Elevated WBC noted on recent labs. Will start azithromycin for increased cough, possible LRTI, and will follow up with pulmonary this Monday.   New thyroid med dose was picked up from pharmacy.

## 2020-10-09 ENCOUNTER — Institutional Professional Consult (permissible substitution): Payer: Medicare Other | Admitting: Pulmonary Disease

## 2020-10-26 ENCOUNTER — Ambulatory Visit: Payer: Medicare Other | Admitting: Gastroenterology

## 2020-10-26 ENCOUNTER — Encounter: Payer: Self-pay | Admitting: Family Medicine

## 2020-10-26 NOTE — Telephone Encounter (Signed)
Pt reports adverse reaction to escitalopram and is asking for alternative please advise.

## 2020-10-27 NOTE — Telephone Encounter (Signed)
Call patient, if any continued feelings of throat closing, should be seen immediately in the emergency room or urgent care.  If better, then schedule virtual visit with myself or other provider to discuss those symptoms further and medication options as she had been on Lexapro for a little while.  No new medications for now

## 2020-10-27 NOTE — Telephone Encounter (Signed)
Called pt, and she reports since stopping the medication she hasn't had the throat swelling feeling. I offered the pt a virtual appt as recommended, but pt states she has an appt on 11/09/2020 and will wait until that appt to discuss this more with the provider.

## 2020-11-09 ENCOUNTER — Other Ambulatory Visit: Payer: Self-pay

## 2020-11-09 ENCOUNTER — Ambulatory Visit (INDEPENDENT_AMBULATORY_CARE_PROVIDER_SITE_OTHER): Payer: Medicare Other | Admitting: Family Medicine

## 2020-11-09 VITALS — BP 133/84 | HR 83 | Temp 97.8°F | Resp 16 | Ht 68.0 in | Wt 241.6 lb

## 2020-11-09 DIAGNOSIS — R0600 Dyspnea, unspecified: Secondary | ICD-10-CM

## 2020-11-09 DIAGNOSIS — R1310 Dysphagia, unspecified: Secondary | ICD-10-CM | POA: Diagnosis not present

## 2020-11-09 DIAGNOSIS — M5441 Lumbago with sciatica, right side: Secondary | ICD-10-CM

## 2020-11-09 DIAGNOSIS — F32A Depression, unspecified: Secondary | ICD-10-CM

## 2020-11-09 DIAGNOSIS — E039 Hypothyroidism, unspecified: Secondary | ICD-10-CM

## 2020-11-09 DIAGNOSIS — M5442 Lumbago with sciatica, left side: Secondary | ICD-10-CM | POA: Diagnosis not present

## 2020-11-09 DIAGNOSIS — R0609 Other forms of dyspnea: Secondary | ICD-10-CM

## 2020-11-09 MED ORDER — BUPROPION HCL ER (XL) 150 MG PO TB24: 150.0000 mg | ORAL_TABLET | Freq: Every day | ORAL | 1 refills | Status: DC

## 2020-11-09 NOTE — Progress Notes (Signed)
Subjective:  Patient ID: Natalie Eaton, female    DOB: 03/23/1956  Age: 65 y.o. MRN: [id number]  CC:  Chief Complaint  Patient presents with  . Back Pain    Pt getting shots in her back on the first for her pain pt also had to r/s pulm appt due to car trouble. Pt unsure why she needs a specialist for emphazima as she just wants medication for this     HPI Nichol Ator Bujak presents for   Chronic back pain: Discussed at January visit. Followed by Dr. Nelva Bush with planned injection as above next week. Thought that hip pain may be coming from back.   Dyspnea on exertion Emphysematous changes on chest x-ray in December, reassuring BNP. Was referred to pulmonary for further evaluation and testing. Dyspnea with increased activity only, not at rest when discussed in January, cough every few days but no fever or changes in cough.  Having to reschedule pulmonary visit d/t car issues.   Depression: Lexapro was added after September visit with her previous primary care provider for depressed mood. MyChart message noted from February 10, patient reported Lexapro causing throat swelling so stopped taking medication. Had stopped med for awhile - did not think she needed it, then mood symptoms returned - restarted lexapro- noticed feeling a feeling that food would not pass after taking for 2 days. Better off meds. Still feeling some depression symptoms. Would like to try different med. Did not follow up with GI as referred for dysphagia. No further feeling of food getting stuck.   Depression screen [facility] 2/9 11/09/2020 09/25/2020 09/01/2020 06/02/2020 01/24/2020  Decreased Interest 3 0 0 3 0  Down, Depressed, Hopeless 3 0 0 2 1  PHQ - 2 Score 6 0 0 5 1  Altered sleeping 1 - - 3 -  Tired, decreased energy 3 - - 3 -  Change in appetite 3 - - 3 -  Feeling bad or failure about yourself  0 - - 0 -  Trouble concentrating 0 - - 0 -  Moving slowly or fidgety/restless 2 - - 0 -  Suicidal thoughts 0 - - 0 -  PHQ-9  Score 15 - - 14 -  Difficult doing work/chores Very difficult - - - -   Hypothyroidism: Lab Results  Component Value Date   TSH 0.139 (L) 09/25/2020  decreased synthroid to 173mcg qd in January.  Feeling better on lower dose- not feeling any further heart fluttering,    History Patient Active Problem List   Diagnosis Date Noted  . Arthritis 06/10/2019  . MVP (mitral valve prolapse) 06/10/2019  . Statin intolerance 02/17/2019  . Pure hypercholesterolemia 11/16/2018  . 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 BMI 32.0-32.9,adult 02/24/2018  . Essential hypertension, benign 11/21/2017  . Hypothyroidism 11/21/2017   Past Medical History:  Diagnosis Date  . Back pain   . Closed TBI (traumatic brain injury) ([place])   . Coronary artery disease   . Epilepsy ([place])   . History of hysterectomy   . 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 Hypertension   . Hypothyroidism   . Insomnia   . MVP (mitral valve prolapse)   . Pneumonia   . Pre-diabetes    Past Surgical History:  Procedure Laterality Date  . ABDOMINAL HYSTERECTOMY    . BLADDER SURGERY    . CARDIAC CATHETERIZATION    . GALLBLADDER SURGERY    . LUNG REMOVAL,  PARTIAL    . ROTATOR CUFF REPAIR    . TONSILLECTOMY     Allergies  Allergen Reactions  . Codeine Other (See Comments)    Tingling on her scalp.   Prior to Admission medications   Medication Sig Start Date End Date Taking? Authorizing Provider  Acetaminophen-Codeine 300-30 MG tablet acetaminophen 300 mg-codeine 30 mg tablet   Yes [provider]  aspirin 81 MG chewable tablet Chew 81 mg by mouth daily.   Yes [provider]  atorvastatin (LIPITOR) 40 MG tablet Take 20 mg by mouth daily.   Yes [provider]  azithromycin (ZITHROMAX) 250 MG tablet Take 2 pills by mouth on day 1, then 1 pill by mouth per day on days 2 through 5. 10/07/20  Yes Wendie Agreste, MD  celecoxib (CELEBREX) 200 MG  capsule Take 1 capsule (200 mg total) by mouth 2 (two) times daily. 06/23/20  Yes Wendie Agreste, MD  cyclobenzaprine (FLEXERIL) 10 MG tablet Take 10 mg by mouth 3 (three) times daily as needed for muscle spasms.   Yes [provider]  gabapentin (NEURONTIN) 300 MG capsule Take 2 capsules (600 mg total) by mouth at bedtime. 08/18/20  Yes Wendie Agreste, MD  hydrochlorothiazide (HYDRODIURIL) 25 MG tablet Take 1 tablet (25 mg total) by mouth daily. 06/23/20  Yes Wendie Agreste, MD  levothyroxine (EUTHYROX) 100 MCG tablet TAKE 1 TABLET BY MOUTH ONCE DAILY BEFORE BREAKFAST 10/03/20  Yes Wendie Agreste, MD  methocarbamol (ROBAXIN) 750 MG tablet Take 1 tablet (750 mg total) by mouth every 8 (eight) hours as needed for muscle spasms. 06/22/20  Yes Jacelyn Pi, Lilia Argue, MD  Multiple Vitamins-Minerals (HAIR SKIN AND NAILS FORMULA PO) Take 1 tablet by mouth daily.   Yes [provider]  omeprazole (PRILOSEC) 20 MG capsule Take 1 capsule (20 mg total) by mouth daily. 09/01/20  Yes Wendie Agreste, MD  potassium chloride (KLOR-CON) 10 MEQ tablet Take 1 tablet (10 mEq total) by mouth 2 (two) times daily. Patient taking differently: Take 10 mEq by mouth daily. 06/23/20  Yes Wendie Agreste, MD  traZODone (DESYREL) 50 MG tablet Take 0.5-1 tablets (25-50 mg total) by mouth at bedtime as needed for sleep. 06/22/20  Yes Jacelyn Pi, Lilia Argue, MD  TURMERIC PO Take by mouth. Qunol brand   Yes [provider]  valACYclovir (VALTREX) 500 MG tablet Take 500 mg by mouth daily. As needed 06/01/18  Yes [provider]  vitamin E 400 UNIT capsule Take 400 Units by mouth daily.   Yes [provider]  diclofenac sodium (VOLTAREN) 1 % GEL Apply 2 g topically 4 (four) times daily. Patient not taking: No sig reported 02/17/19   Jacelyn Pi, Lilia Argue, MD  escitalopram (LEXAPRO) 20 MG tablet 10 mg daily. Patient not taking: Reported on 11/09/2020    [provider]    Social History   Socioeconomic History  . Marital status: Widowed    Spouse name: Not on file  . Number of children: 2  . Years of education: Not on file  . Highest education level: Not on file  Occupational History  . Not on file  Tobacco Use  . Smoking status: Former Research scientist (life sciences)  . Smokeless tobacco: Never Used  Vaping Use  . Vaping Use: Never used  Substance and Sexual Activity  . Alcohol use: Yes    Alcohol/week: 1.0 standard drink    Types: 1 Glasses of wine per week  .  Drug use: No  . Sexual activity: Not Currently    Birth control/protection: None  Other Topics Concern  . Not on file  Social History Narrative  . Not on file   Social Determinants of Health   Financial Resource Strain: Not on file  Food Insecurity: Not on file  Transportation Needs: Not on file  Physical Activity: Not on file  Stress: Not on file  Social Connections: Not on file  Intimate Partner Violence: Not on file    Review of Systems   Objective:   Vitals:   11/09/20 1011  BP: 133/84  Pulse: 83  Resp: 16  Temp: 97.8 F (36.6 C)  TempSrc: Temporal  SpO2: 99%  Weight: 241 lb 9.6 oz (109.6 kg)  Height: 5\' 8"  (1.727 m)     Physical Exam Vitals reviewed.  Constitutional:      Appearance: She is well-developed and well-nourished.  HENT:     Head: Normocephalic and atraumatic.  Eyes:     Extraocular Movements: EOM normal.     Conjunctiva/sclera: Conjunctivae normal.     Pupils: Pupils are equal, round, and reactive to light.  Neck:     Vascular: No carotid bruit.  Cardiovascular:     Rate and Rhythm: Normal rate and regular rhythm.     Pulses: Intact distal pulses.     Heart sounds: Normal heart sounds.  Pulmonary:     Effort: Pulmonary effort is normal. No respiratory distress.     Breath sounds: Normal breath sounds. No stridor. No wheezing, rhonchi or rales.  Abdominal:     Palpations: Abdomen is soft. There is no pulsatile mass.     Tenderness: There is no abdominal  tenderness.  Skin:    General: Skin is warm and dry.  Neurological:     Mental Status: She is alert and oriented to person, place, and time.  Psychiatric:        Mood and Affect: Mood and affect normal.        Behavior: Behavior normal.     Assessment & Plan:  Naelani Lafrance Cradle is a 65 y.o. female . Depression, unspecified depression type - Plan: buPROPion (WELLBUTRIN XL) 150 MG 24 hr tablet Dysphagia, unspecified type  -Unlikely Lexapro cause of throat symptoms but we will try different med for now with Wellbutrin.  However, discussed if any return of throat symptoms or heartburn would still recommend meeting with gastroenterology for other potential causes of dysphagia.  Understanding expressed  DOE (dyspnea on exertion)  -Continue follow-up with pulmonary for likely PFTs/evaluation dyspnea and prior abnormal chest x-ray.  No new meds for now  Low back pain with bilateral sciatica, unspecified back pain laterality, unspecified chronicity  -Keep follow-up with back specialist  Hypothyroidism, unspecified type - Plan: TSH  -No med changes for now, check TSH  Meds ordered this encounter  Medications  . buPROPion (WELLBUTRIN XL) 150 MG 24 hr tablet    Sig: Take 1 tablet (150 mg total) by mouth daily.    Dispense:  90 tablet    Refill:  1   Patient Instructions    Keep appointment 11/22/20 with Dr. Vaughan Browner with [facility]. I would recommend calling their office if there is a question about the location of that appointment.   Although less likely cause of feeling of food getting stuck, we can try different antidepressant.  If any return of heartburn, feeling of food getting stuck or any change in sensation swallowing you do need to follow up with  gastroenterology as we planned previously.   Start Wellbutrin at low dose initially - 150mg  per day. Let me know how you are doing in next 6 weeks. Office visit in 3 months.   Continue same dose levothyroxine for now. I will check  labs.   Return to the clinic or go to the nearest emergency room if any of your symptoms worsen or new symptoms occur.   If you have lab work done today you will be contacted with your lab results within the next 2 weeks.  If you have not heard from [place] then please contact us. The fastest way to get your results is to register for My Chart.   IF you received an x-ray today, you will receive an invoice from [facility]. Please contact [facility] at [phone] with questions or concerns regarding your invoice.   IF you received labwork today, you will receive an invoice from [place]. Please contact [facility] at [phone] with questions or concerns regarding your invoice.   Our billing staff will not be able to assist you with questions regarding bills from these companies.  You will be contacted with the lab results as soon as they are available. The fastest way to get your results is to activate your My Chart account. Instructions are located on the last page of this paperwork. If you have not heard from [place] regarding the results in 2 weeks, please contact this office.         Signed, Merri Ray, MD [facility]

## 2020-11-09 NOTE — Patient Instructions (Addendum)
  Keep appointment 11/22/20 with Dr. Vaughan Browner with [facility]. I would recommend calling their office if there is a question about the location of that appointment.   Although less likely cause of feeling of food getting stuck, we can try different antidepressant.  If any return of heartburn, feeling of food getting stuck or any change in sensation swallowing you do need to follow up with gastroenterology as we planned previously.   Start Wellbutrin at low dose initially - 150mg  per day. Let me know how you are doing in next 6 weeks. Office visit in 3 months.   Continue same dose levothyroxine for now. I will check labs.   Return to the clinic or go to the nearest emergency room if any of your symptoms worsen or new symptoms occur.   If you have lab work done today you will be contacted with your lab results within the next 2 weeks.  If you have not heard from [place] then please contact us. The fastest way to get your results is to register for My Chart.   IF you received an x-ray today, you will receive an invoice from [facility]. Please contact [facility] at [phone] with questions or concerns regarding your invoice.   IF you received labwork today, you will receive an invoice from [place]. Please contact [facility] at [phone] with questions or concerns regarding your invoice.   Our billing staff will not be able to assist you with questions regarding bills from these companies.  You will be contacted with the lab results as soon as they are available. The fastest way to get your results is to activate your My Chart account. Instructions are located on the last page of this paperwork. If you have not heard from [place] regarding the results in 2 weeks, please contact this office.

## 2020-11-10 LAB — TSH: TSH: 0.54 u[IU]/mL (ref 0.450–4.500)

## 2020-11-11 ENCOUNTER — Encounter: Payer: Self-pay | Admitting: Family Medicine

## 2020-11-22 ENCOUNTER — Other Ambulatory Visit: Payer: Self-pay

## 2020-11-22 ENCOUNTER — Ambulatory Visit (INDEPENDENT_AMBULATORY_CARE_PROVIDER_SITE_OTHER): Payer: Medicare Other | Admitting: Pulmonary Disease

## 2020-11-22 ENCOUNTER — Encounter: Payer: Self-pay | Admitting: Pulmonary Disease

## 2020-11-22 VITALS — BP 146/84 | HR 73 | Temp 97.3°F | Ht 68.0 in | Wt 242.8 lb

## 2020-11-22 DIAGNOSIS — J984 Other disorders of lung: Secondary | ICD-10-CM | POA: Diagnosis not present

## 2020-11-22 DIAGNOSIS — J439 Emphysema, unspecified: Secondary | ICD-10-CM

## 2020-11-22 LAB — CBC WITH DIFFERENTIAL/PLATELET
Basophils Absolute: 0 10*3/uL (ref 0.0–0.1)
Basophils Relative: 0.3 % (ref 0.0–3.0)
Eosinophils Absolute: 0.4 10*3/uL (ref 0.0–0.7)
Eosinophils Relative: 2.6 % (ref 0.0–5.0)
HCT: 39.2 % (ref 36.0–46.0)
Hemoglobin: 12.7 g/dL (ref 12.0–15.0)
Lymphocytes Relative: 46.5 % — ABNORMAL HIGH (ref 12.0–46.0)
Lymphs Abs: 6.5 10*3/uL — ABNORMAL HIGH (ref 0.7–4.0)
MCHC: 32.4 g/dL (ref 30.0–36.0)
MCV: 92.8 fl (ref 78.0–100.0)
Monocytes Absolute: 0.7 10*3/uL (ref 0.1–1.0)
Monocytes Relative: 4.7 % (ref 3.0–12.0)
Neutro Abs: 6.4 10*3/uL (ref 1.4–7.7)
Neutrophils Relative %: 45.9 % (ref 43.0–77.0)
Platelets: 286 10*3/uL (ref 150.0–400.0)
RBC: 4.22 Mil/uL (ref 3.87–5.11)
RDW: 13.6 % (ref 11.5–15.5)
WBC: 13.9 10*3/uL — ABNORMAL HIGH (ref 4.0–10.5)

## 2020-11-22 NOTE — Progress Notes (Signed)
Natalie Eaton    [id number]    Dec 15, 1955  Primary Care Physician:Greene, Ranell Patrick, MD  Referring Physician: Wendie Agreste, MD [address] [place],  [place] [address]  Chief complaint: Consult for emphysema, COPD  HPI: 66 year old with smoking history Sent for evaluation of COPD Had a chest x-ray by her primary care which showed bullous emphysema and hyperinflation Complains of dyspnea on exertion.  No symptoms at rest.  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  Pets: Cats, dogs Occupation: Retired Furniture conservator/restorer Exposures: No mold, hot tub, Customer service manager.  No feather pillows or comforters Smoking history: Half pack per day for 15 years.  Quit in 2010 Travel history: Previously lived in [place], [place].  No significant recent travel history Relevant family history: No family history of lung disease  Outpatient Encounter Medications as of 11/22/2020  Medication Sig   Acetaminophen-Codeine 300-30 MG tablet acetaminophen 300 mg-codeine 30 mg tablet   aspirin 81 MG chewable tablet Chew 81 mg by mouth daily.   atorvastatin (LIPITOR) 40 MG tablet Take 20 mg by mouth daily.   azithromycin (ZITHROMAX) 250 MG tablet Take 2 pills by mouth on day 1, then 1 pill by mouth per day on days 2 through 5.   buPROPion (WELLBUTRIN XL) 150 MG 24 hr tablet Take 1 tablet (150 mg total) by mouth daily.   celecoxib (CELEBREX) 200 MG capsule Take 1 capsule (200 mg total) by mouth 2 (two) times daily.   cyclobenzaprine (FLEXERIL) 10 MG tablet Take 10 mg by mouth 3 (three) times daily as needed for muscle spasms.   diclofenac sodium (VOLTAREN) 1 % GEL Apply 2 g topically 4 (four) times daily.   escitalopram (LEXAPRO) 20 MG tablet 10 mg daily.   gabapentin (NEURONTIN) 300 MG capsule Take 2 capsules (600 mg total) by mouth at bedtime.   hydrochlorothiazide (HYDRODIURIL) 25 MG  tablet Take 1 tablet (25 mg total) by mouth daily.   levothyroxine (EUTHYROX) 100 MCG tablet TAKE 1 TABLET BY MOUTH ONCE DAILY BEFORE BREAKFAST   methocarbamol (ROBAXIN) 750 MG tablet Take 1 tablet (750 mg total) by mouth every 8 (eight) hours as needed for muscle spasms.   Multiple Vitamins-Minerals (HAIR SKIN AND NAILS FORMULA PO) Take 1 tablet by mouth daily.   omeprazole (PRILOSEC) 20 MG capsule Take 1 capsule (20 mg total) by mouth daily.   potassium chloride (KLOR-CON) 10 MEQ tablet Take 1 tablet (10 mEq total) by mouth 2 (two) times daily. (Patient taking differently: Take 10 mEq by mouth daily.)   traZODone (DESYREL) 50 MG tablet Take 0.5-1 tablets (25-50 mg total) by mouth at bedtime as needed for sleep.   TURMERIC PO Take by mouth. Qunol brand   valACYclovir (VALTREX) 500 MG tablet Take 500 mg by mouth daily. As needed   vitamin E 400 UNIT capsule Take 400 Units by mouth daily.   No facility-administered encounter medications on file as of 11/22/2020.    Physical Exam: Blood pressure (!) 146/84, pulse 73, temperature (!) 97.3 F (36.3 C), temperature source Temporal, height 5\' 8"  (1.727 m), weight 242 lb 12.8 oz (110.1 kg), SpO2 99 %. Gen:      No acute distress HEENT:  EOMI, sclera anicteric Neck:     No masses; no thyromegaly Lungs:    Clear to auscultation bilaterally; normal respiratory effort CV:  Regular rate and rhythm; no murmurs Abd:      + bowel sounds; soft, non-tender; no palpable masses, no distension Ext:    No edema; adequate peripheral perfusion Skin:      Warm and dry; no rash Neuro: alert and oriented x 3 Psych: normal mood and affect  Data Reviewed: Imaging: Chest x-ray 09/01/2020-right upper lobe occasional distortion, bullous emphysema, I have reviewed the images personally.  PFTs:  Labs: CBC 08/26/2019-WBC 10.1, eos 2%, absolute eosinophil count 202  Assessment:  Emphysema, suspected COPD Schedule pulmonary function test, start  Anoro inhaler  Chest x-ray reviewed with right upper lobe bullous formation with mild oxygen structural distortion and apical scarring.  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  At least by report this has remained stable since 2004.  We will continue monitoring.  Plan/Recommendations: PFTs, anoro inhaler  Marshell Garfinkel MD [facility] 11/22/2020, 10:59 AM  CC: Wendie Agreste, MD

## 2020-11-22 NOTE — Patient Instructions (Signed)
We will start you on an inhaler called anoro Check CBC differential, alpha-1 antitrypsin levels and phenotype Schedule pulmonary function test  Follow-up in 3 months.

## 2020-11-27 ENCOUNTER — Encounter: Payer: Self-pay | Admitting: Emergency Medicine

## 2020-11-27 ENCOUNTER — Ambulatory Visit
Admission: EM | Admit: 2020-11-27 | Discharge: 2020-11-27 | Disposition: A | Discharge status: Home / Self Care | Payer: Medicare Other | Attending: Family Medicine | Admitting: Family Medicine

## 2020-11-27 ENCOUNTER — Other Ambulatory Visit: Payer: Self-pay

## 2020-11-27 ENCOUNTER — Encounter: Payer: Self-pay | Admitting: Family Medicine

## 2020-11-27 DIAGNOSIS — Z1152 Encounter for screening for COVID-19: Secondary | ICD-10-CM

## 2020-11-27 DIAGNOSIS — F32A Depression, unspecified: Secondary | ICD-10-CM

## 2020-11-27 NOTE — ED Triage Notes (Signed)
Needs covid test for procedure

## 2020-11-28 LAB — NOVEL CORONAVIRUS, NAA: SARS-CoV-2, NAA: NOT DETECTED

## 2020-11-28 LAB — SARS-COV-2, NAA 2 DAY TAT

## 2020-11-28 MED ORDER — BUPROPION HCL ER (XL) 150 MG PO TB24: 300.0000 mg | ORAL_TABLET | Freq: Every day | ORAL | 1 refills | Status: DC

## 2020-11-28 NOTE — Telephone Encounter (Signed)
Pt reports Wellbutrin not strong enough still becoming upset easily. Please advise.

## 2020-11-30 ENCOUNTER — Other Ambulatory Visit: Payer: Self-pay

## 2020-11-30 ENCOUNTER — Ambulatory Visit (INDEPENDENT_AMBULATORY_CARE_PROVIDER_SITE_OTHER): Payer: Medicare Other | Admitting: Pulmonary Disease

## 2020-11-30 ENCOUNTER — Telehealth: Payer: Self-pay | Admitting: Pulmonary Disease

## 2020-11-30 DIAGNOSIS — J984 Other disorders of lung: Secondary | ICD-10-CM | POA: Diagnosis not present

## 2020-11-30 LAB — PULMONARY FUNCTION TEST
DL/VA % pred: 106 %
DL/VA: 4.32 ml/min/mmHg/L
DLCO cor % pred: 86 %
DLCO cor: 19.65 ml/min/mmHg
DLCO unc % pred: 84 %
DLCO unc: 19.22 ml/min/mmHg
FEF 25-75 Post: 2.26 L/sec
FEF 25-75 Pre: 1.85 L/sec
FEF2575-%Change-Post: 22 %
FEF2575-%Pred-Post: 95 %
FEF2575-%Pred-Pre: 78 %
FEV1-%Change-Post: 4 %
FEV1-%Pred-Post: 75 %
FEV1-%Pred-Pre: 72 %
FEV1-Post: 2.13 L
FEV1-Pre: 2.04 L
FEV1FVC-%Change-Post: 4 %
FEV1FVC-%Pred-Pre: 102 %
FEV6-%Change-Post: 0 %
FEV6-%Pred-Post: 73 %
FEV6-%Pred-Pre: 72 %
FEV6-Post: 2.58 L
FEV6-Pre: 2.57 L
FEV6FVC-%Change-Post: 0 %
FEV6FVC-%Pred-Post: 103 %
FEV6FVC-%Pred-Pre: 103 %
FVC-%Change-Post: 0 %
FVC-%Pred-Post: 70 %
FVC-%Pred-Pre: 70 %
FVC-Post: 2.58 L
FVC-Pre: 2.58 L
Post FEV1/FVC ratio: 82 %
Post FEV6/FVC ratio: 100 %
Pre FEV1/FVC ratio: 79 %
Pre FEV6/FVC Ratio: 99 %
RV % pred: 91 %
RV: 2.1 L
TLC % pred: 83 %
TLC: 4.74 L

## 2020-11-30 MED ORDER — ANORO ELLIPTA 62.5-25 MCG/INH IN AEPB: 1.0000 | INHALATION_SPRAY | Freq: Every day | RESPIRATORY_TRACT | 0 refills | Status: DC

## 2020-11-30 NOTE — Telephone Encounter (Signed)
Sorry to hear that she is not feeling well.  If our Anoro is making her breathing worse go ahead and stop Anoro once her breathing test has returned will discuss if any further treatment options are indicated. May use Mucinex DM twice daily as needed for cough congestion.  We will send to Dr. Vaughan Browner for any further instructions.  Would continue with office visit recommendations for follow-up to review PFTs  Please contact office for sooner follow up if symptoms do not improve or worsen or seek emergency care

## 2020-11-30 NOTE — Telephone Encounter (Signed)
Tammy Please advise

## 2020-11-30 NOTE — Progress Notes (Signed)
Patient seen in the office today and instructed on use of Anoro Ellipta.  Patient expressed understanding and demonstrated technique.  

## 2020-11-30 NOTE — Telephone Encounter (Signed)
Called and spoke with patient who states she feels worse since taking the sample of Anoro. States chest feels heavy when breathing in. Pharmacy is [facility]. Patient phone number is [phone].  Today was the first time taking Anoro, patient wondering if there is something else she can take. Chest feels heavy when she breathe in. Non productive cough which you had prior already which is same as before starting inhaler.  Dr Vaughan Browner please advise.

## 2020-11-30 NOTE — Progress Notes (Signed)
PFT done today. 

## 2020-11-30 NOTE — Addendum Note (Signed)
Addended by: Ashley Akin C on: 11/30/2020 01:00 PM   Modules accepted: Orders

## 2020-11-30 NOTE — Telephone Encounter (Signed)
Called and spoke with pt letting her know the info stated by TP and she verbalized understanding. Nothing further needed.

## 2020-12-02 LAB — ALPHA-1 ANTITRYPSIN PHENOTYPE: A-1 Antitrypsin, Ser: 125 mg/dL (ref 83–199)

## 2020-12-04 ENCOUNTER — Encounter: Payer: Self-pay | Admitting: Family Medicine

## 2020-12-04 ENCOUNTER — Other Ambulatory Visit: Payer: Self-pay | Admitting: Family Medicine

## 2020-12-04 DIAGNOSIS — M5137 Other intervertebral disc degeneration, lumbosacral region: Secondary | ICD-10-CM

## 2020-12-04 DIAGNOSIS — M5441 Lumbago with sciatica, right side: Secondary | ICD-10-CM

## 2020-12-04 NOTE — Telephone Encounter (Signed)
Dr. Vaughan Browner, please see mychart message sent by pt and advise:  To: [facility]OL    From: Sofija Antwi Portell    Created: 12/04/2020 8:39 AM     *-*-*This message was handled on 12/04/2020 11:31 AM by Alfonse Garringer P*-*-*  Dr. Vaughan Browner, should I start in a pulmonary Rehabilitation class/treatment? Will it help my lungs? Natalie Eaton

## 2020-12-04 NOTE — Telephone Encounter (Signed)
Dr. Vaughan Browner, please see mychart message sent by pt and advise:  To: [facility] POOL    From: Janella Rogala Knapke    Created: 12/04/2020 7:59 AM     *-*-*This message has not been handled.*-*-*  Dr. Vaughan Browner, could you explain the breathing test results. I don't know what these numbers are telling me about my condition. Thank you, Sofie Harm

## 2020-12-05 ENCOUNTER — Ambulatory Visit: Payer: Medicare Other | Admitting: Cardiology

## 2020-12-05 NOTE — Telephone Encounter (Signed)
This a reply to the two myChart messages from 3/21  Lung function tests are normal which is good news. I do not think you will need a referral to pulmonary rehab.  However starting an exercise regimen at the home or gym is advisable.  Marshell Garfinkel MD [facility] 12/05/2020, 1:22 PM

## 2020-12-06 ENCOUNTER — Other Ambulatory Visit: Payer: Self-pay | Admitting: Family Medicine

## 2020-12-06 ENCOUNTER — Encounter: Payer: Self-pay | Admitting: Family Medicine

## 2020-12-06 ENCOUNTER — Other Ambulatory Visit: Payer: Self-pay

## 2020-12-06 DIAGNOSIS — M5137 Other intervertebral disc degeneration, lumbosacral region: Secondary | ICD-10-CM

## 2020-12-06 DIAGNOSIS — M5441 Lumbago with sciatica, right side: Secondary | ICD-10-CM

## 2020-12-06 DIAGNOSIS — I10 Essential (primary) hypertension: Secondary | ICD-10-CM

## 2020-12-06 MED ORDER — HYDROCHLOROTHIAZIDE 25 MG PO TABS: 25.0000 mg | ORAL_TABLET | Freq: Every day | ORAL | 3 refills | Status: DC

## 2020-12-14 ENCOUNTER — Encounter: Payer: Self-pay | Admitting: Family Medicine

## 2020-12-14 DIAGNOSIS — M51379 Other intervertebral disc degeneration, lumbosacral region without mention of lumbar back pain or lower extremity pain: Secondary | ICD-10-CM

## 2020-12-14 DIAGNOSIS — M5137 Other intervertebral disc degeneration, lumbosacral region: Secondary | ICD-10-CM

## 2020-12-14 DIAGNOSIS — M5441 Lumbago with sciatica, right side: Secondary | ICD-10-CM

## 2020-12-14 DIAGNOSIS — M5442 Lumbago with sciatica, left side: Secondary | ICD-10-CM

## 2020-12-14 MED ORDER — GABAPENTIN 300 MG PO CAPS: 600.0000 mg | ORAL_CAPSULE | Freq: Every day | ORAL | 0 refills | Status: DC

## 2020-12-14 NOTE — Telephone Encounter (Signed)
Patient is requesting a refill of the following medications: Requested Prescriptions   Pending Prescriptions Disp Refills  . gabapentin (NEURONTIN) 300 MG capsule 180 capsule 0    Sig: Take 2 capsules (600 mg total) by mouth at bedtime.    Date of patient request: 12/14/2020 Last office visit: 11/09/2020 Date of last refill: 08/18/2021 Last refill amount: 180 Follow up time period per chart: 3 months

## 2020-12-28 ENCOUNTER — Other Ambulatory Visit: Payer: Self-pay | Admitting: Family Medicine

## 2020-12-28 ENCOUNTER — Encounter: Payer: Self-pay | Admitting: Family Medicine

## 2020-12-28 DIAGNOSIS — M5441 Lumbago with sciatica, right side: Secondary | ICD-10-CM

## 2020-12-28 DIAGNOSIS — M5137 Other intervertebral disc degeneration, lumbosacral region: Secondary | ICD-10-CM

## 2020-12-28 DIAGNOSIS — M5442 Lumbago with sciatica, left side: Secondary | ICD-10-CM

## 2020-12-28 MED ORDER — CELECOXIB 200 MG PO CAPS: 200.0000 mg | ORAL_CAPSULE | Freq: Two times a day (BID) | ORAL | 0 refills | Status: DC

## 2020-12-28 NOTE — Telephone Encounter (Signed)
Natalie Eaton Patient  Patient is requesting a refill of the following medications: Requested Prescriptions   Pending Prescriptions Disp Refills  . celecoxib (CELEBREX) 200 MG capsule 180 capsule 0    Sig: Take 1 capsule (200 mg total) by mouth 2 (two) times daily.    Date of patient request: 12/28/2020 Last office visit: 11/09/20 Date of last refill: 06/23/20 Last refill amount: 180 capsules Follow up time period per chart: 3 months

## 2021-01-03 ENCOUNTER — Encounter: Payer: Self-pay | Admitting: Family Medicine

## 2021-01-03 NOTE — Telephone Encounter (Signed)
Pt concerned medication ineffective to anxiety, and may be causing BP spoke asked to monitor, anything else to add?

## 2021-01-07 NOTE — Telephone Encounter (Signed)
With prior possible side effects with Lexapro chose wellbutrin. I would like to meet with her prior to further changes. Please schedule OV. Will advise her as well.

## 2021-01-09 NOTE — Telephone Encounter (Signed)
Pt declined to schedule at this time//ELEA

## 2021-01-09 NOTE — Telephone Encounter (Signed)
Pt needs OV to meet with Natalie Eaton about Medication

## 2021-01-15 ENCOUNTER — Other Ambulatory Visit: Payer: Self-pay

## 2021-01-15 ENCOUNTER — Ambulatory Visit
Admission: EM | Admit: 2021-01-15 | Discharge: 2021-01-15 | Disposition: A | Discharge status: Home / Self Care | Payer: Medicare Other | Attending: Family Medicine | Admitting: Family Medicine

## 2021-01-15 DIAGNOSIS — R059 Cough, unspecified: Secondary | ICD-10-CM | POA: Diagnosis not present

## 2021-01-15 DIAGNOSIS — J0140 Acute pansinusitis, unspecified: Secondary | ICD-10-CM | POA: Diagnosis not present

## 2021-01-15 MED ORDER — AMOXICILLIN 875 MG PO TABS: 875.0000 mg | ORAL_TABLET | Freq: Two times a day (BID) | ORAL | 0 refills | Status: DC

## 2021-01-15 MED ORDER — FLUCONAZOLE 150 MG PO TABS: 150.0000 mg | ORAL_TABLET | Freq: Once | ORAL | 0 refills | Status: AC

## 2021-01-15 MED ORDER — PROMETHAZINE-DM 6.25-15 MG/5ML PO SYRP: 5.0000 mL | ORAL_SOLUTION | Freq: Four times a day (QID) | ORAL | 0 refills | Status: DC | PRN

## 2021-01-15 NOTE — ED Triage Notes (Signed)
Pt presents with nasal congestion and cough for past 8 days

## 2021-01-15 NOTE — ED Provider Notes (Signed)
[facility]    CSN: [id number] Arrival date & time: 01/15/21  0844      History   Chief Complaint Chief Complaint  Patient presents with  . Nasal Congestion    HPI Natalie Eaton is a 65 y.o. female.   HPI  Patient presents today for nasal congestion, facial pressure, sinus congestion and persistent cough ongoing for greater than 7 days.  She has been afebrile and denies any known sick contacts.  Cough is occasionally productive.  She has a history of emphysema however denies any chest tightness or shortness of breath. She has taken multiple over-the-counter allergy medications without relief of current symptoms.  Remainder of Review of Systems negative except as noted in the HPI.    Past Medical History:  Diagnosis Date  . Back pain   . Closed TBI (traumatic brain injury) ([place])   . Coronary artery disease   . Epilepsy ([place])   . History of hysterectomy   . 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 Hypertension   . Hypothyroidism   . Insomnia   . MVP (mitral valve prolapse)   . Pneumonia   . Pre-diabetes     Patient Active Problem List   Diagnosis Date Noted  . Arthritis 06/10/2019  . MVP (mitral valve prolapse) 06/10/2019  . Statin intolerance 02/17/2019  . Pure hypercholesterolemia 11/16/2018  . 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 BMI 32.0-32.9,adult 02/24/2018  . Essential hypertension, benign 11/21/2017  . Hypothyroidism 11/21/2017    Past Surgical History:  Procedure Laterality Date  . ABDOMINAL HYSTERECTOMY    . BLADDER SURGERY    . CARDIAC CATHETERIZATION    . GALLBLADDER SURGERY    . LUNG REMOVAL, PARTIAL    . ROTATOR CUFF REPAIR    . TONSILLECTOMY      OB History   No obstetric history on file.      Home Medications    Prior to Admission medications   Medication Sig Start Date End Date Taking? Authorizing Provider  amoxicillin (AMOXIL) 875 MG tablet Take 1 tablet (875 mg total)  by mouth 2 (two) times daily. 01/15/21  Yes Scot Jun, FNP  fluconazole (DIFLUCAN) 150 MG tablet Take 1 tablet (150 mg total) by mouth once for 1 dose. Repeat if needed 01/15/21 01/15/21 Yes Scot Jun, FNP  gabapentin (NEURONTIN) 300 MG capsule TAKE 2 CAPSULES(600 MG) BY MOUTH AT BEDTIME 12/28/20   Wendie Agreste, MD  promethazine-dextromethorphan (PROMETHAZINE-DM) 6.25-15 MG/5ML syrup Take 5 mLs by mouth 4 (four) times daily as needed for cough. 01/15/21  Yes Scot Jun, FNP  Acetaminophen-Codeine 300-30 MG tablet acetaminophen 300 mg-codeine 30 mg tablet    [provider]  aspirin 81 MG chewable tablet Chew 81 mg by mouth daily.    [provider]  atorvastatin (LIPITOR) 40 MG tablet Take 20 mg by mouth daily.    [provider]  buPROPion (WELLBUTRIN XL) 150 MG 24 hr tablet Take 2 tablets (300 mg total) by mouth daily. 11/28/20   Wendie Agreste, MD  celecoxib (CELEBREX) 200 MG capsule Take 1 capsule (200 mg total) by mouth 2 (two) times daily. 12/28/20   Maximiano Coss, NP  cyclobenzaprine (FLEXERIL) 10 MG tablet Take 10 mg by mouth 3 (three) times daily as needed for muscle spasms.    [provider]  diclofenac sodium (VOLTAREN) 1 % GEL Apply 2 g topically 4 (four) times daily.  02/17/19   Jacelyn Pi, Lilia Argue, MD  escitalopram (LEXAPRO) 20 MG tablet 10 mg daily.    [provider]  hydrochlorothiazide (HYDRODIURIL) 25 MG tablet Take 1 tablet (25 mg total) by mouth daily. 12/06/20   Wendie Agreste, MD  levothyroxine (EUTHYROX) 100 MCG tablet TAKE 1 TABLET BY MOUTH ONCE DAILY BEFORE BREAKFAST 10/03/20   Wendie Agreste, MD  methocarbamol (ROBAXIN) 750 MG tablet Take 1 tablet (750 mg total) by mouth every 8 (eight) hours as needed for muscle spasms. 06/22/20   Daleen Squibb, MD  Multiple Vitamins-Minerals [facility] SKIN AND NAILS FORMULA PO) Take 1 tablet by mouth daily.    [provider]  omeprazole (PRILOSEC) 20 MG  capsule Take 1 capsule (20 mg total) by mouth daily. 09/01/20   Wendie Agreste, MD  potassium chloride (KLOR-CON) 10 MEQ tablet Take 1 tablet (10 mEq total) by mouth 2 (two) times daily. Patient taking differently: Take 10 mEq by mouth daily. 06/23/20   Wendie Agreste, MD  traZODone (DESYREL) 50 MG tablet Take 0.5-1 tablets (25-50 mg total) by mouth at bedtime as needed for sleep. 06/22/20   Daleen Squibb, MD  TURMERIC PO Take by mouth. Qunol brand    [provider]  umeclidinium-vilanterol (ANORO ELLIPTA) 62.5-25 MCG/INH AEPB Inhale 1 puff into the lungs daily. 11/30/20   Mannam, Hart Robinsons, MD  valACYclovir (VALTREX) 500 MG tablet Take 500 mg by mouth daily. As needed 06/01/18   [provider]  vitamin E 400 UNIT capsule Take 400 Units by mouth daily.    [provider]    Family History Family History  Problem Relation Age of Onset  . Healthy Mother   . Heart disease Brother   . Healthy Daughter   . Healthy Son     Social History Social History   Tobacco Use  . Smoking status: Former Smoker    Packs/day: 0.25    Years: 15.00    Pack years: 3.75    Types: Cigarettes  . Smokeless tobacco: Never Used  Vaping Use  . Vaping Use: Never used  Substance Use Topics  . Alcohol use: Yes    Alcohol/week: 1.0 standard drink    Types: 1 Glasses of wine per week  . Drug use: No     Allergies   Codeine   Review of Systems Review of Systems Pertinent negatives listed in HPI   Physical Exam Triage Vital Signs ED Triage Vitals  Enc Vitals Group     BP 01/15/21 0939 120/82     Pulse Rate 01/15/21 0939 78     Resp 01/15/21 0939 18     Temp 01/15/21 0939 98.6 F (37 C)     Temp Source 01/15/21 0939 Oral     SpO2 01/15/21 0939 98 %     Weight --      Height --      Head Circumference --      Peak Flow --      Pain Score 01/15/21 0953 4     Pain Loc --      Pain Edu? --      Excl. in [place]? --    No data found.  Updated Vital Signs BP  120/82   Pulse 78   Temp 98.6 F (37 C) (Oral)   Resp 18   SpO2 98%   Visual Acuity Right Eye Distance:   Left Eye Distance:   Bilateral Distance:    Right  Eye Near:   Left Eye Near:    Bilateral Near:     Physical Exam  General Appearance:    Alert, cooperative, no distress  HENT:   Normocephalic, ears normal, nares mucosal edema with congestion, rhinorrhea, oropharynx  Normal   Eyes:    PERRL, conjunctiva/corneas clear, EOM's intact       Lungs:     Clear to auscultation bilaterally, respirations unlabored  Heart:    Regular rate and rhythm  Neurologic:   Awake, alert, oriented x 3. No apparent focal neurological           defect.      UC Treatments / Results  Labs (all labs ordered are listed, but only abnormal results are displayed) Labs Reviewed - No data to display  EKG   Radiology No results found.  Procedures Procedures (including critical care time)  Medications Ordered in UC Medications - No data to display  Initial Impression / Assessment and Plan / UC Course  I have reviewed the triage vital signs and the nursing notes.  Pertinent labs & imaging results that were available during my care of the patient were reviewed by me and considered in my medical decision making (see chart for details).    Treated for nonrecurrent pansinusitis with cough.  Treatment per discharge instructions.  Red flag precautions given that warrant immediate evaluation.  Patient verbalized understanding and agreement today's plan. Final Clinical Impressions(s) / UC Diagnoses   Final diagnoses:  Acute non-recurrent pansinusitis  Cough   Discharge Instructions   None    ED Prescriptions    Medication Sig Dispense Auth. Provider   amoxicillin (AMOXIL) 875 MG tablet Take 1 tablet (875 mg total) by mouth 2 (two) times daily. 20 tablet Scot Jun, FNP   fluconazole (DIFLUCAN) 150 MG tablet Take 1 tablet (150 mg total) by mouth once for 1 dose. Repeat if needed 2  tablet Scot Jun, FNP   promethazine-dextromethorphan (PROMETHAZINE-DM) 6.25-15 MG/5ML syrup Take 5 mLs by mouth 4 (four) times daily as needed for cough. 140 mL Scot Jun, FNP     PDMP not reviewed this encounter.   Scot Jun, FNP 01/15/21 1135

## 2021-02-09 ENCOUNTER — Encounter: Payer: Self-pay | Admitting: Family Medicine

## 2021-02-09 ENCOUNTER — Other Ambulatory Visit: Payer: Self-pay

## 2021-02-09 DIAGNOSIS — E039 Hypothyroidism, unspecified: Secondary | ICD-10-CM

## 2021-02-09 MED ORDER — POTASSIUM CHLORIDE CRYS ER 10 MEQ PO TBCR: 10.0000 meq | EXTENDED_RELEASE_TABLET | Freq: Two times a day (BID) | ORAL | 0 refills | Status: DC

## 2021-03-13 ENCOUNTER — Encounter: Payer: Self-pay | Admitting: Family Medicine

## 2021-03-14 ENCOUNTER — Other Ambulatory Visit: Payer: Self-pay

## 2021-03-14 DIAGNOSIS — M5137 Other intervertebral disc degeneration, lumbosacral region: Secondary | ICD-10-CM

## 2021-03-14 DIAGNOSIS — M5441 Lumbago with sciatica, right side: Secondary | ICD-10-CM

## 2021-03-14 MED ORDER — METHOCARBAMOL 750 MG PO TABS
750.0000 mg | ORAL_TABLET | Freq: Three times a day (TID) | ORAL | 0 refills | Status: DC | PRN
Start: 2021-03-14 — End: 2022-12-04

## 2021-03-14 NOTE — Telephone Encounter (Signed)
I need my Methocarbamol 750 refilled please. I don't have them on my list at [facility]. Thank you  LFD 06/22/20 #120 with1 refill LOV 11/09/20 NOV none

## 2021-03-15 ENCOUNTER — Encounter: Payer: Self-pay | Admitting: Family Medicine

## 2021-03-15 ENCOUNTER — Other Ambulatory Visit: Payer: Self-pay | Admitting: Family Medicine

## 2021-03-15 DIAGNOSIS — M5442 Lumbago with sciatica, left side: Secondary | ICD-10-CM

## 2021-03-15 DIAGNOSIS — M5441 Lumbago with sciatica, right side: Secondary | ICD-10-CM

## 2021-03-15 DIAGNOSIS — M5137 Other intervertebral disc degeneration, lumbosacral region: Secondary | ICD-10-CM

## 2021-03-28 ENCOUNTER — Other Ambulatory Visit: Payer: Self-pay | Admitting: Family Medicine

## 2021-03-28 DIAGNOSIS — E039 Hypothyroidism, unspecified: Secondary | ICD-10-CM

## 2021-03-31 ENCOUNTER — Other Ambulatory Visit: Payer: Self-pay | Admitting: Family Medicine

## 2021-03-31 DIAGNOSIS — F32A Depression, unspecified: Secondary | ICD-10-CM

## 2021-04-11 ENCOUNTER — Other Ambulatory Visit: Payer: Self-pay | Admitting: Registered Nurse

## 2021-04-11 DIAGNOSIS — M5442 Lumbago with sciatica, left side: Secondary | ICD-10-CM

## 2021-04-11 DIAGNOSIS — M5137 Other intervertebral disc degeneration, lumbosacral region: Secondary | ICD-10-CM

## 2021-04-11 DIAGNOSIS — M5441 Lumbago with sciatica, right side: Secondary | ICD-10-CM

## 2021-04-11 NOTE — Telephone Encounter (Signed)
LFD 12/28/20 #180 with no refills LOV 11/09/20 NOV none

## 2021-04-19 ENCOUNTER — Encounter: Payer: Self-pay | Admitting: Family Medicine

## 2021-04-19 DIAGNOSIS — F32A Depression, unspecified: Secondary | ICD-10-CM

## 2021-04-23 MED ORDER — BUPROPION HCL ER (XL) 150 MG PO TB24
450.0000 mg | ORAL_TABLET | Freq: Every day | ORAL | 1 refills | Status: DC
Start: 2021-04-23 — End: 2022-07-04

## 2021-04-23 NOTE — Addendum Note (Signed)
Addended by: Merri Ray R on: 04/23/2021 01:15 PM   Modules accepted: Orders

## 2022-06-13 LAB — HM MAMMOGRAPHY: HM Mammogram: NORMAL (ref 0–4)

## 2022-06-14 ENCOUNTER — Other Ambulatory Visit: Payer: Self-pay | Admitting: Family Medicine

## 2022-06-14 DIAGNOSIS — M5137 Other intervertebral disc degeneration, lumbosacral region: Secondary | ICD-10-CM

## 2022-06-14 DIAGNOSIS — M5441 Lumbago with sciatica, right side: Secondary | ICD-10-CM

## 2022-06-14 NOTE — Telephone Encounter (Signed)
Gabapentin, Celebrex have been used intermittently for back pain.  No recent visit with me and in care everywhere appears that she may have been under care of other primary care provider.  Please clarify, and if she is still following up with me, needs to schedule updated visit.  I can refill temporarily if that is the case or if she has a new primary care provider, refills should be coming from them.  Let me know.  Thanks.

## 2022-06-14 NOTE — Telephone Encounter (Signed)
I sent this earlier to you . They have sent a second request and I tried to deny it but I can not do so

## 2022-06-14 NOTE — Telephone Encounter (Signed)
Please see my previous response and question on PCP, which will help decide if I can temporarily refill.  Thank you.

## 2022-06-14 NOTE — Telephone Encounter (Signed)
I do not have access to deny this medication but pt is asking for a refill on  Gabapentin 300 mg  LOV: not on file  Last Refill:12/28/20 Upcoming appt: none on file

## 2022-06-14 NOTE — Telephone Encounter (Signed)
Noted! Thank you

## 2022-06-14 NOTE — Telephone Encounter (Signed)
I spoke w/ pt and advised she moved to [place] and now back in [place] and she is seeing a new provider in [place] on 07/04/22. She states these Rx where from her Dr in [place]. She states we can disregard

## 2022-07-04 ENCOUNTER — Ambulatory Visit (INDEPENDENT_AMBULATORY_CARE_PROVIDER_SITE_OTHER): Payer: Medicare Other | Admitting: Nurse Practitioner

## 2022-07-04 ENCOUNTER — Encounter: Payer: Self-pay | Admitting: Nurse Practitioner

## 2022-07-04 VITALS — BP 127/77 | HR 74 | Ht 68.0 in | Wt 242.8 lb

## 2022-07-04 DIAGNOSIS — M1990 Unspecified osteoarthritis, unspecified site: Secondary | ICD-10-CM | POA: Diagnosis not present

## 2022-07-04 DIAGNOSIS — I10 Essential (primary) hypertension: Secondary | ICD-10-CM

## 2022-07-04 DIAGNOSIS — M5137 Other intervertebral disc degeneration, lumbosacral region: Secondary | ICD-10-CM

## 2022-07-04 DIAGNOSIS — E039 Hypothyroidism, unspecified: Secondary | ICD-10-CM

## 2022-07-04 DIAGNOSIS — Z1211 Encounter for screening for malignant neoplasm of colon: Secondary | ICD-10-CM

## 2022-07-04 DIAGNOSIS — R7303 Prediabetes: Secondary | ICD-10-CM

## 2022-07-04 DIAGNOSIS — M5441 Lumbago with sciatica, right side: Secondary | ICD-10-CM

## 2022-07-04 MED ORDER — POTASSIUM CHLORIDE CRYS ER 10 MEQ PO TBCR: 10.0000 meq | EXTENDED_RELEASE_TABLET | Freq: Two times a day (BID) | ORAL | 0 refills | Status: DC

## 2022-07-04 MED ORDER — GABAPENTIN 300 MG PO CAPS: 300.0000 mg | ORAL_CAPSULE | Freq: Three times a day (TID) | ORAL | 0 refills | Status: DC

## 2022-07-04 MED ORDER — CELECOXIB 200 MG PO CAPS: 200.0000 mg | ORAL_CAPSULE | Freq: Two times a day (BID) | ORAL | 1 refills | Status: DC

## 2022-07-04 NOTE — Progress Notes (Signed)
New Patient Office Visit  Subjective    Patient ID: Natalie Eaton, female    DOB: [birth year]  Age: 66 y.o. MRN: [id number]  CC:  Chief Complaint  Patient presents with   New Patient (Initial Visit)    HPI Natalie Eaton presents to establish care. Patient moved from [place] a month ago. Patient had scolosis and is followed [facility].    Outpatient Encounter Medications as of 07/04/2022  Medication Sig   aspirin 81 MG chewable tablet Chew 81 mg by mouth daily.   hydrochlorothiazide (HYDRODIURIL) 25 MG tablet Take 1 tablet (25 mg total) by mouth daily.   levothyroxine (SYNTHROID) 100 MCG tablet TAKE 1 TABLET BY MOUTH EVERY DAY BEFORE BREAKFAST   methocarbamol (ROBAXIN) 750 MG tablet Take 1 tablet (750 mg total) by mouth every 8 (eight) hours as needed for muscle spasms.   Multiple Vitamin (MULTIVITAMIN ADULT PO) Take 1 each by mouth daily at 2 PM.   nitroGLYCERIN (NITROSTAT) 0.4 MG SL tablet Place 0.4 mg under the tongue every 5 (five) minutes as needed for chest pain.   rosuvastatin (CRESTOR) 10 MG tablet Take 10 mg by mouth daily.   traMADol (ULTRAM) 50 MG tablet Take 50 mg by mouth 3 (three) times daily.   traZODone (DESYREL) 100 MG tablet Take 100 mg by mouth at bedtime.   valACYclovir (VALTREX) 500 MG tablet Take 500 mg by mouth daily. As needed   vitamin E 400 UNIT capsule Take 400 Units by mouth daily.   [DISCONTINUED] celecoxib (CELEBREX) 200 MG capsule TAKE 1 CAPSULE(200 MG) BY MOUTH TWICE DAILY   [DISCONTINUED] gabapentin (NEURONTIN) 300 MG capsule Take 300 mg by mouth 3 (three) times daily.   [DISCONTINUED] potassium chloride (KLOR-CON) 10 MEQ tablet Take 1 tablet (10 mEq total) by mouth 2 (two) times daily.   celecoxib (CELEBREX) 200 MG capsule Take 1 capsule (200 mg total) by mouth 2 (two) times daily.   gabapentin (NEURONTIN) 300 MG capsule Take 1 capsule (300 mg total) by mouth 3 (three) times daily.   potassium chloride (KLOR-CON M) 10 MEQ tablet Take 1 tablet  (10 mEq total) by mouth 2 (two) times daily.   [DISCONTINUED] amoxicillin (AMOXIL) 875 MG tablet Take 1 tablet (875 mg total) by mouth 2 (two) times daily.   [DISCONTINUED] atorvastatin (LIPITOR) 40 MG tablet Take 20 mg by mouth daily.   [DISCONTINUED] buPROPion (WELLBUTRIN XL) 150 MG 24 hr tablet Take 3 tablets (450 mg total) by mouth daily.   [DISCONTINUED] gabapentin (NEURONTIN) 300 MG capsule TAKE 2 CAPSULES(600 MG) BY MOUTH AT BEDTIME   [DISCONTINUED] Multiple Vitamins-Minerals (HAIR SKIN AND NAILS FORMULA PO) Take 1 tablet by mouth daily.   [DISCONTINUED] omeprazole (PRILOSEC) 20 MG capsule Take 1 capsule (20 mg total) by mouth daily.   [DISCONTINUED] promethazine-dextromethorphan (PROMETHAZINE-DM) 6.25-15 MG/5ML syrup Take 5 mLs by mouth 4 (four) times daily as needed for cough.   [DISCONTINUED] traZODone (DESYREL) 50 MG tablet Take 0.5-1 tablets (25-50 mg total) by mouth at bedtime as needed for sleep.   [DISCONTINUED] TURMERIC PO Take by mouth. Qunol brand   [DISCONTINUED] umeclidinium-vilanterol (ANORO ELLIPTA) 62.5-25 MCG/INH AEPB Inhale 1 puff into the lungs daily.   No facility-administered encounter medications on file as of 07/04/2022.    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, per gyn   Hypertension    Hypothyroidism    Insomnia    MVP (mitral valve prolapse)    Pneumonia    Pre-diabetes     Past Surgical History:  Procedure Laterality Date   ABDOMINAL HYSTERECTOMY     BLADDER SURGERY     CARDIAC CATHETERIZATION     GALLBLADDER SURGERY     LUNG REMOVAL, PARTIAL     ROTATOR CUFF REPAIR     TONSILLECTOMY      Family History  Problem Relation Age of Onset   Healthy Mother    Heart disease Brother    Healthy Daughter    Healthy Son     Social History    Socioeconomic History   Marital status: Widowed    Spouse name: Not on file   Number of children: 2   Years of education: Not on file   Highest education level: Not on file  Occupational History   Not on file  Tobacco Use   Smoking status: Former    Packs/day: 0.25    Years: 15.00    Total pack years: 3.75    Types: Cigarettes   Smokeless tobacco: Never  Vaping Use   Vaping Use: Never used  Substance and Sexual Activity   Alcohol use: Yes    Alcohol/week: 1.0 standard drink of alcohol    Types: 1 Glasses of wine per week   Drug use: No   Sexual activity: Not Currently    Birth control/protection: None  Other Topics Concern   Not on file  Social History Narrative   Not on file   Social Determinants of Health   Financial Resource Strain: Not on file  Food Insecurity: Not on file  Transportation Needs: Not on file  Physical Activity: Not on file  Stress: Not on file  Social Connections: Not on file  Intimate Partner Violence: Not on file    Review of Systems  Constitutional: Negative.   HENT: Negative.    Eyes: Negative.   Respiratory:  Negative for cough and shortness of breath.   Cardiovascular:  Negative for chest pain and leg swelling.  Genitourinary: Negative.   Musculoskeletal:  Positive for back pain.  Skin: Negative.   Neurological:  Negative for dizziness, tingling and headaches.  Psychiatric/Behavioral:  Negative for depression, substance abuse and suicidal ideas.         Objective    BP 127/77   Pulse 74   Ht '5\' 8"'$  (1.727 m)   Wt 242 lb 12.8 oz (110.1 kg)   BMI 36.92 kg/m   Physical Exam HENT:     Right Ear: Tympanic membrane normal.     Left Ear: Tympanic membrane normal.     Nose: Nose normal.     Mouth/Throat:     Mouth: Mucous membranes are moist.     Dentition: Abnormal dentition.  Eyes:     Extraocular Movements: Extraocular movements intact.     Pupils: Pupils are equal, round, and reactive to light.  Cardiovascular:      Rate and Rhythm: Normal rate.     Heart sounds: Murmur heard.  Abdominal:     General: Bowel sounds are normal.     Palpations: Abdomen is soft.  Musculoskeletal:        General: Normal range of motion.  Skin:    General: Skin is warm.     Capillary Refill: Capillary refill takes less than 2  seconds.  Neurological:     General: No focal deficit present.     Mental Status: She is alert and oriented to person, place, and time. Mental status is at baseline.  Psychiatric:        Mood and Affect: Mood normal.        Behavior: Behavior normal.        Thought Content: Thought content normal.        Judgment: Judgment normal.         Assessment & Plan:   Problem List Items Addressed This Visit       Cardiovascular and Mediastinum   Essential hypertension, benign    Patient BP 127/77  in the office today Advised pt to follow a low sodium and heart healthy diet. Continue the current medication.       Relevant Medications   nitroGLYCERIN (NITROSTAT) 0.4 MG SL tablet   rosuvastatin (CRESTOR) 10 MG tablet   Other Relevant Orders   CBC with Differential/Platelet (Completed)   COMPLETE METABOLIC PANEL WITH GFR (Completed)   Lipid panel (Completed)     Endocrine   Hypothyroidism - Primary    Stable on medication. Continue levothyroxine 100 mcg daily. We will check TSH.      Relevant Medications   potassium chloride (KLOR-CON M) 10 MEQ tablet   Other Relevant Orders   TSH (Completed)     Musculoskeletal and Integument   Arthritis    Stable with medication. Patient is followed by [facility].      Relevant Medications   traMADol (ULTRAM) 50 MG tablet   celecoxib (CELEBREX) 200 MG capsule     Other   Prediabetes    Patient hemoglobin A1c 5.9 on 11/09/2021. Advised her to eat low-carb diet and follow regular exercise routine. We will check hemoglobin A1c      Relevant Orders   Hemoglobin A1c (Completed)   Other Visit Diagnoses     Colon cancer screening        Relevant Orders   Cologuard       No follow-ups on file.   Theresia Lo, NP

## 2022-07-05 LAB — LIPID PANEL
Cholesterol: 168 mg/dL (ref <200)
HDL: 37 mg/dL — ABNORMAL LOW (ref >=50)
LDL Cholesterol (Calc): 97 mg/dL (calc)
Non-HDL Cholesterol (Calc): 131 mg/dL (calc) — ABNORMAL HIGH (ref <130)
Total CHOL/HDL Ratio: 4.5 (calc) (ref <5.0)
Triglycerides: 216 mg/dL — ABNORMAL HIGH (ref <150)

## 2022-07-05 LAB — CBC WITH DIFFERENTIAL/PLATELET
Absolute Monocytes: 590 cells/uL (ref 200–950)
Basophils Absolute: 58 cells/uL (ref 0–200)
Basophils Relative: 0.4 %
Eosinophils Absolute: 245 cells/uL (ref 15–500)
Eosinophils Relative: 1.7 %
HCT: 43.5 % (ref 35.0–45.0)
Hemoglobin: 14.3 g/dL (ref 11.7–15.5)
Lymphs Abs: 7776 cells/uL — ABNORMAL HIGH (ref 850–3900)
MCH: 31.4 pg (ref 27.0–33.0)
MCHC: 32.9 g/dL (ref 32.0–36.0)
MCV: 95.6 fL (ref 80.0–100.0)
MPV: 10.9 fL (ref 7.5–12.5)
Monocytes Relative: 4.1 %
Neutro Abs: 5731 cells/uL (ref 1500–7800)
Neutrophils Relative %: 39.8 %
Platelets: 252 10*3/uL (ref 140–400)
RBC: 4.55 10*6/uL (ref 3.80–5.10)
RDW: 12.9 % (ref 11.0–15.0)
Total Lymphocyte: 54 %
WBC: 14.4 10*3/uL — ABNORMAL HIGH (ref 3.8–10.8)

## 2022-07-05 LAB — COMPLETE METABOLIC PANEL WITH GFR
AG Ratio: 2 (calc) (ref 1.0–2.5)
ALT: 16 U/L (ref 6–29)
AST: 15 U/L (ref 10–35)
Albumin: 4.5 g/dL (ref 3.6–5.1)
Alkaline phosphatase (APISO): 59 U/L (ref 37–153)
BUN: 22 mg/dL (ref 7–25)
CO2: 21 mmol/L (ref 20–32)
Calcium: 9.8 mg/dL (ref 8.6–10.4)
Chloride: 105 mmol/L (ref 98–110)
Creat: 0.77 mg/dL (ref 0.50–1.05)
Globulin: 2.2 g/dL (calc) (ref 1.9–3.7)
Glucose, Bld: 84 mg/dL (ref 65–99)
Potassium: 4.6 mmol/L (ref 3.5–5.3)
Sodium: 142 mmol/L (ref 135–146)
Total Bilirubin: 0.4 mg/dL (ref 0.2–1.2)
Total Protein: 6.7 g/dL (ref 6.1–8.1)
eGFR: 84 mL/min/{1.73_m2} (ref >=60)

## 2022-07-05 LAB — TSH: TSH: 3.48 mIU/L (ref 0.40–4.50)

## 2022-07-05 LAB — HEMOGLOBIN A1C
Hgb A1c MFr Bld: 5.9 % of total Hgb — ABNORMAL HIGH (ref <5.7)
Mean Plasma Glucose: 123 mg/dL
eAG (mmol/L): 6.8 mmol/L

## 2022-07-08 ENCOUNTER — Encounter: Payer: Self-pay | Admitting: Nurse Practitioner

## 2022-07-09 ENCOUNTER — Encounter: Payer: Self-pay | Admitting: Nurse Practitioner

## 2022-07-11 DIAGNOSIS — R7303 Prediabetes: Secondary | ICD-10-CM | POA: Insufficient documentation

## 2022-07-11 NOTE — Assessment & Plan Note (Signed)
Patient hemoglobin A1c 5.9 on 11/09/2021. Advised her to eat low-carb diet and follow regular exercise routine. We will check hemoglobin A1c

## 2022-07-11 NOTE — Assessment & Plan Note (Signed)
Patient BP 127/77  in the office today Advised pt to follow a low sodium and heart healthy diet. Continue the current medication.

## 2022-07-11 NOTE — Assessment & Plan Note (Signed)
Stable with medication. Patient is followed by [facility].

## 2022-07-11 NOTE — Assessment & Plan Note (Signed)
Stable on medication. Continue levothyroxine 100 mcg daily. We will check TSH.

## 2022-07-12 ENCOUNTER — Other Ambulatory Visit: Payer: Self-pay | Admitting: *Deleted

## 2022-07-12 ENCOUNTER — Encounter: Payer: Self-pay | Admitting: Nurse Practitioner

## 2022-07-12 DIAGNOSIS — E039 Hypothyroidism, unspecified: Secondary | ICD-10-CM

## 2022-07-12 MED ORDER — LEVOTHYROXINE SODIUM 100 MCG PO TABS: ORAL_TABLET | ORAL | 1 refills | Status: DC

## 2022-08-12 ENCOUNTER — Other Ambulatory Visit: Payer: Self-pay | Admitting: Nurse Practitioner

## 2022-08-12 DIAGNOSIS — I10 Essential (primary) hypertension: Secondary | ICD-10-CM

## 2022-08-13 ENCOUNTER — Other Ambulatory Visit: Payer: Self-pay

## 2022-08-13 MED ORDER — HYDROCHLOROTHIAZIDE 25 MG PO TABS: 25.0000 mg | ORAL_TABLET | Freq: Every day | ORAL | 3 refills | Status: DC

## 2022-09-20 ENCOUNTER — Ambulatory Visit (INDEPENDENT_AMBULATORY_CARE_PROVIDER_SITE_OTHER): Payer: Medicare Other | Admitting: Physician Assistant

## 2022-09-20 ENCOUNTER — Encounter: Payer: Self-pay | Admitting: Physician Assistant

## 2022-09-20 VITALS — BP 125/69 | HR 75 | Temp 98.3°F | Resp 16 | Ht 64.0 in | Wt 194.4 lb

## 2022-09-20 DIAGNOSIS — M549 Dorsalgia, unspecified: Secondary | ICD-10-CM

## 2022-09-20 DIAGNOSIS — R7303 Prediabetes: Secondary | ICD-10-CM

## 2022-09-20 DIAGNOSIS — E7800 Pure hypercholesterolemia, unspecified: Secondary | ICD-10-CM | POA: Diagnosis not present

## 2022-09-20 DIAGNOSIS — E039 Hypothyroidism, unspecified: Secondary | ICD-10-CM

## 2022-09-20 DIAGNOSIS — G8929 Other chronic pain: Secondary | ICD-10-CM

## 2022-09-20 DIAGNOSIS — R1013 Epigastric pain: Secondary | ICD-10-CM

## 2022-09-20 DIAGNOSIS — E876 Hypokalemia: Secondary | ICD-10-CM

## 2022-09-20 DIAGNOSIS — I10 Essential (primary) hypertension: Secondary | ICD-10-CM | POA: Diagnosis not present

## 2022-09-20 DIAGNOSIS — D72829 Elevated white blood cell count, unspecified: Secondary | ICD-10-CM

## 2022-09-20 MED ORDER — GABAPENTIN 300 MG PO CAPS: 300.0000 mg | ORAL_CAPSULE | Freq: Three times a day (TID) | ORAL | 1 refills | Status: DC

## 2022-09-20 MED ORDER — ROSUVASTATIN CALCIUM 10 MG PO TABS: 10.0000 mg | ORAL_TABLET | Freq: Every day | ORAL | 1 refills | Status: DC

## 2022-09-20 MED ORDER — POTASSIUM CHLORIDE ER 10 MEQ PO CPCR: 10.0000 meq | ORAL_CAPSULE | Freq: Two times a day (BID) | ORAL | 1 refills | Status: DC

## 2022-09-20 MED ORDER — PANTOPRAZOLE SODIUM 40 MG PO TBEC: 40.0000 mg | DELAYED_RELEASE_TABLET | Freq: Every day | ORAL | 1 refills | Status: DC

## 2022-09-20 NOTE — Assessment & Plan Note (Addendum)
Managed with rousvastin 10 mg  Reviewed prior lipid panel Ldl goal < 100 Will repeat at [facility]pe

## 2022-09-20 NOTE — Assessment & Plan Note (Signed)
Likely gastritis vs pud 2/2 antiinflammatory use Encouraged d/c tramadol and celebrex Rx pantoprazole daily for 6 weeks at least If no improvement would ref to GI for endoscopy

## 2022-09-20 NOTE — Assessment & Plan Note (Signed)
Pt reports her last PCP changed her dose after her last TSH level  Will repeat tsh/t4  Managed with levothyroxine 100 mcg

## 2022-09-20 NOTE — Progress Notes (Signed)
I,Sulibeya S Dimas,acting as a Education administrator for [employer], PA-C.,have documented all relevant documentation on the behalf of [name], PA-C,as directed by  [name], PA-C while in the presence of [name], PA-C.   New patient visit   Patient: Natalie Eaton   DOB: 1956-07-28   67 y.o. Female  MRN: [id number] Visit Date: 09/20/2022  Today's healthcare provider: [name], PA-C   Cc. New patient establish care  Subjective    Natalie Eaton is a 67 y.o. female who presents today as a new patient to establish care.  HPI HPI   Transferring from Internal Medicine Lewayne Bunting NP Last edited by Dorian Pod, CMA on 09/20/2022  2:04 PM.      Pt reports epigastric pain with associated nausea on and off, worsening x 6 months. She feels worse pain after eating. She has used tramadol and celebrex intermittently for back pain -- scoliosis related, and knee/hip pain. She is seeing ortho .  She is concerned over a high white blood cell count that has not been followed up on.    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, per gyn   Hypertension    Hypothyroidism    Insomnia    MVP (mitral valve prolapse)    Pneumonia    Pre-diabetes    Past Surgical History:  Procedure Laterality Date   ABDOMINAL HYSTERECTOMY     BLADDER SURGERY     CARDIAC CATHETERIZATION     GALLBLADDER SURGERY     LUNG REMOVAL, PARTIAL     ROTATOR CUFF REPAIR     TONSILLECTOMY     TUBAL LIGATION     Family Status  Relation Name Status   Mother  Alive   Father  (Not Specified)   Sister  Web designer  Alive   Daughter  Alive   Son  Alive   Family History  Problem Relation Age of Onset   Healthy Mother    Scoliosis Mother    Heart disease  Father    Heart disease Brother    Healthy Daughter    Healthy Son    Social History   Socioeconomic History   Marital status: Widowed    Spouse name: Not on file   Number of children: 2   Years of education: Not on file   Highest education level: Not on file  Occupational History   Not on file  Tobacco Use   Smoking status: Former    Packs/day: 0.25    Years: 15.00    Total pack years: 3.75    Types: Cigarettes    Quit date: 22    Years since quitting: 28.0   Smokeless tobacco: Never  Vaping Use   Vaping Use: Never used  Substance and Sexual Activity   Alcohol use: Yes    Alcohol/week: 1.0 standard drink of alcohol    Types: 1 Glasses of wine per week   Drug use: No   Sexual activity: Not Currently    Birth control/protection: None  Other Topics Concern   Not on file  Social History Narrative   Not on file   Social Determinants of Health   Financial  Resource Strain: Not on file  Food Insecurity: Not on file  Transportation Needs: Not on file  Physical Activity: Not on file  Stress: Not on file  Social Connections: Not on file   Outpatient Medications Prior to Visit  Medication Sig   aspirin 81 MG chewable tablet Chew 81 mg by mouth daily.   celecoxib (CELEBREX) 200 MG capsule Take 1 capsule (200 mg total) by mouth 2 (two) times daily.   hydrochlorothiazide (HYDRODIURIL) 25 MG tablet Take 1 tablet (25 mg total) by mouth daily.   levothyroxine (SYNTHROID) 100 MCG tablet TAKE 1 TABLET BY MOUTH EVERY DAY BEFORE BREAKFAST   methocarbamol (ROBAXIN) 750 MG tablet Take 1 tablet (750 mg total) by mouth every 8 (eight) hours as needed for muscle spasms.   Multiple Vitamin (MULTIVITAMIN ADULT PO) Take 1 each by mouth daily at 2 PM.   traMADol (ULTRAM) 50 MG tablet Take 50 mg by mouth 3 (three) times daily.   traZODone (DESYREL) 100 MG tablet Take 100 mg by mouth at bedtime.   valACYclovir (VALTREX) 500 MG tablet Take 500 mg by mouth daily. As needed   vitamin E 400  UNIT capsule Take 400 Units by mouth daily.   [DISCONTINUED] gabapentin (NEURONTIN) 300 MG capsule TAKE 1 CAPSULE BY MOUTH THREE TIMES A DAY   [DISCONTINUED] nitroGLYCERIN (NITROSTAT) 0.4 MG SL tablet Place 0.4 mg under the tongue every 5 (five) minutes as needed for chest pain.   [DISCONTINUED] potassium chloride (KLOR-CON M) 10 MEQ tablet Take 1 tablet (10 mEq total) by mouth 2 (two) times daily.   [DISCONTINUED] rosuvastatin (CRESTOR) 10 MG tablet Take 10 mg by mouth daily.   No facility-administered medications prior to visit.   Allergies  Allergen Reactions   Codeine Other (See Comments)    Tingling on her scalp.    Immunization History  Administered Date(s) Administered   Influenza,inj,Quad PF,6+ Mos 08/21/2018, 06/10/2019   PFIZER(Purple Top)SARS-COV-2 Vaccination 12/09/2019, 01/01/2020, 07/04/2020   Pneumococcal Conjugate-13 06/02/2020   Pneumococcal-Unspecified 09/17/2019   Tdap 09/16/2018, 08/11/2020, 02/02/2022   Unspecified SARS-COV-2 Vaccination 12/09/2019, 01/01/2020, 12/14/2020    Health Maintenance  Topic Date Due   Fecal DNA (Cologuard)  09/02/2021   Medicare Annual Wellness (AWV)  06/04/2022   Pneumonia Vaccine 40+ Years old (2 - PPSV23 or PCV20) 07/05/2023 (Originally 07/28/2020)   DEXA SCAN  07/05/2023 (Originally 06/01/2020)   COVID-19 Vaccine (7 - 2023-24 season) 07/17/2023 (Originally 05/17/2022)   Zoster Vaccines- Shingrix (1 of 2) 07/17/2023 (Originally 04/19/2005)   MAMMOGRAM  06/14/2023   DTaP/Tdap/Td (4 - Td or Tdap) 02/03/2032   Hepatitis C Screening  Completed   HPV VACCINES  Aged Out   INFLUENZA VACCINE  Discontinued    Patient Care Team: [name], PA-C as PCP - General (Physician Assistant)  Review of Systems  Constitutional:  Positive for chills, diaphoresis and fatigue.  Respiratory:  Positive for cough.   Gastrointestinal:  Positive for abdominal distention, abdominal pain and nausea.  Musculoskeletal:  Positive for arthralgias and  back pain.  Psychiatric/Behavioral:  Positive for sleep disturbance.     Last CBC Lab Results  Component Value Date   WBC 14.4 (H) 07/04/2022   HGB 14.3 07/04/2022   HCT 43.5 07/04/2022   MCV 95.6 07/04/2022   MCH 31.4 07/04/2022   RDW 12.9 07/04/2022   PLT 252 69/67/8938   Last metabolic panel Lab Results  Component Value Date   GLUCOSE 84 07/04/2022   NA 142 07/04/2022   K 4.6 07/04/2022  CL 105 07/04/2022   CO2 21 07/04/2022   BUN 22 07/04/2022   CREATININE 0.77 07/04/2022   EGFR 84 07/04/2022   CALCIUM 9.8 07/04/2022   PROT 6.7 07/04/2022   ALBUMIN 4.4 09/01/2020   LABGLOB 2.5 09/01/2020   AGRATIO 1.8 09/01/2020   BILITOT 0.4 07/04/2022   ALKPHOS 89 09/01/2020   AST 15 07/04/2022   ALT 16 07/04/2022   Last lipids Lab Results  Component Value Date   CHOL 168 07/04/2022   HDL 37 (L) 07/04/2022   LDLCALC 97 07/04/2022   TRIG 216 (H) 07/04/2022   CHOLHDL 4.5 07/04/2022   Last hemoglobin A1c Lab Results  Component Value Date   HGBA1C 5.9 (H) 07/04/2022   Last thyroid functions Lab Results  Component Value Date   TSH 3.48 07/04/2022   Last vitamin D Lab Results  Component Value Date   VD25OH 33.4 07/19/2019   Last vitamin B12 and Folate No results found for: "VITAMINB12", "FOLATE"     Objective    BP 125/69 (BP Location: Right Arm, Patient Position: Sitting, Cuff Size: Large)   Pulse 75   Temp 98.3 F (36.8 C) (Oral)   Resp 16   Ht '5\' 4"'$  (1.626 m)   Wt 194 lb 6.4 oz (88.2 kg)   SpO2 100%   BMI 33.37 kg/m  BP Readings from Last 3 Encounters:  09/20/22 125/69  07/04/22 127/77  01/15/21 120/82   Wt Readings from Last 3 Encounters:  09/20/22 194 lb 6.4 oz (88.2 kg)  07/04/22 242 lb 12.8 oz (110.1 kg)  11/22/20 242 lb 12.8 oz (110.1 kg)    Physical Exam Constitutional:      General: She is awake.     Appearance: She is well-developed.  HENT:     Head: Normocephalic.  Eyes:     Conjunctiva/sclera: Conjunctivae normal.   Cardiovascular:     Rate and Rhythm: Normal rate and regular rhythm.     Heart sounds: Normal heart sounds.  Pulmonary:     Effort: Pulmonary effort is normal.     Breath sounds: Normal breath sounds.  Skin:    General: Skin is warm.  Neurological:     Mental Status: She is alert and oriented to person, place, and time.  Psychiatric:        Attention and Perception: Attention normal.        Mood and Affect: Mood normal.        Speech: Speech normal.        Behavior: Behavior is cooperative.    Depression Screen    09/20/2022    1:56 PM 07/04/2022    9:22 AM 11/09/2020   11:47 AM 09/25/2020    3:56 PM  PHQ 2/9 Scores  PHQ - 2 Score 0 0 6 0  PHQ- 9 Score 6  15    No results found for any visits on 09/20/22.  Assessment & Plan      Problem List Items Addressed This Visit       Cardiovascular and Mediastinum   Essential hypertension, benign    Chronic, well controlled. Managed with hctz 25 mg and potassium 10 meq BID pt dislikes potassium tablet and would prefer the capsule. Reviewed last cmp F/u 6 mo      Relevant Medications   rosuvastatin (CRESTOR) 10 MG tablet     Endocrine   Hypothyroidism - Primary    Pt reports her last PCP changed her dose after her last TSH level  Will repeat tsh/t4  Managed with levothyroxine 100 mcg      Relevant Orders   TSH + free T4     Other   Pure hypercholesterolemia    Managed with rousvastin 10 mg  Reviewed prior lipid panel Ldl goal < 100 Will repeat at cpe      Relevant Medications   rosuvastatin (CRESTOR) 10 MG tablet   Prediabetes    Historically will monitor q 6 mo 1 year      Leukocytosis    Will repeat cbc      Relevant Orders   CBC w/Diff/Platelet   Epigastric pain    Likely gastritis vs pud 2/2 antiinflammatory use Encouraged d/c tramadol and celebrex Rx pantoprazole daily for 6 weeks at least If no improvement would ref to GI for endoscopy      Relevant Medications   pantoprazole (PROTONIX) 40  MG tablet   Chronic back pain    Pt states related to scoliosis Managed by ortho w/ celebrex and tramadol, pt also takes gabapentin 300 mg TID Reviewed cmp  Continue to follow with ortho      Relevant Medications   gabapentin (NEURONTIN) 300 MG capsule   Other Visit Diagnoses     Hypokalemia       Relevant Medications   potassium chloride (MICRO-K) 10 MEQ CR capsule        Return in about 6 months (around 03/21/2023) for chronic conditions.     I, [name], PA-C have reviewed all documentation for this visit. The documentation on  09/20/22  for the exam, diagnosis, procedures, and orders are all accurate and complete.  [name], PA-C [facility] [address] [place], [place], [address] Office: [phone] Fax: [place]

## 2022-09-20 NOTE — Assessment & Plan Note (Signed)
Pt states related to scoliosis Managed by ortho w/ celebrex and tramadol, pt also takes gabapentin 300 mg TID Reviewed cmp  Continue to follow with ortho

## 2022-09-20 NOTE — Assessment & Plan Note (Signed)
Historically will monitor q 6 mo 1 year

## 2022-09-20 NOTE — Assessment & Plan Note (Signed)
Will repeat cbc

## 2022-09-20 NOTE — Assessment & Plan Note (Signed)
Chronic, well controlled. Managed with hctz 25 mg and potassium 10 meq BID pt dislikes potassium tablet and would prefer the capsule. Reviewed last cmp F/u 6 mo

## 2022-09-21 ENCOUNTER — Encounter: Payer: Self-pay | Admitting: Physician Assistant

## 2022-09-21 LAB — CBC WITH DIFFERENTIAL/PLATELET
Basophils Absolute: 0.1 10*3/uL (ref 0.0–0.2)
Basos: 1 %
EOS (ABSOLUTE): 0.4 10*3/uL (ref 0.0–0.4)
Eos: 3 %
Hematocrit: 41.4 % (ref 34.0–46.6)
Hemoglobin: 14 g/dL (ref 11.1–15.9)
Immature Grans (Abs): 0 10*3/uL (ref 0.0–0.1)
Immature Granulocytes: 0 %
Lymphocytes Absolute: 6.2 10*3/uL — ABNORMAL HIGH (ref 0.7–3.1)
Lymphs: 45 %
MCH: 31.9 pg (ref 26.6–33.0)
MCHC: 33.8 g/dL (ref 31.5–35.7)
MCV: 94 fL (ref 79–97)
Monocytes Absolute: 0.6 10*3/uL (ref 0.1–0.9)
Monocytes: 4 %
Neutrophils Absolute: 6.6 10*3/uL (ref 1.4–7.0)
Neutrophils: 47 %
Platelets: 258 10*3/uL (ref 150–450)
RBC: 4.39 x10E6/uL (ref 3.77–5.28)
RDW: 11.9 % (ref 11.7–15.4)
WBC: 13.9 10*3/uL — ABNORMAL HIGH (ref 3.4–10.8)

## 2022-09-21 LAB — TSH+FREE T4
Free T4: 1.38 ng/dL (ref 0.82–1.77)
TSH: 0.122 u[IU]/mL — ABNORMAL LOW (ref 0.450–4.500)

## 2022-09-23 ENCOUNTER — Other Ambulatory Visit: Payer: Self-pay | Admitting: Physician Assistant

## 2022-09-23 ENCOUNTER — Encounter: Payer: Self-pay | Admitting: Physician Assistant

## 2022-09-23 DIAGNOSIS — D72820 Lymphocytosis (symptomatic): Secondary | ICD-10-CM

## 2022-09-23 DIAGNOSIS — E039 Hypothyroidism, unspecified: Secondary | ICD-10-CM

## 2022-09-23 MED ORDER — LEVOTHYROXINE SODIUM 88 MCG PO TABS: 88.0000 ug | ORAL_TABLET | Freq: Every day | ORAL | 3 refills | Status: DC

## 2022-09-24 ENCOUNTER — Other Ambulatory Visit: Payer: Self-pay | Admitting: Physician Assistant

## 2022-09-24 DIAGNOSIS — E039 Hypothyroidism, unspecified: Secondary | ICD-10-CM

## 2022-09-24 MED ORDER — LEVOTHYROXINE SODIUM 88 MCG PO TABS: 88.0000 ug | ORAL_TABLET | Freq: Every day | ORAL | 3 refills | Status: DC

## 2022-09-25 ENCOUNTER — Encounter: Payer: Self-pay | Admitting: Physician Assistant

## 2022-09-26 ENCOUNTER — Other Ambulatory Visit: Payer: Self-pay | Admitting: Physician Assistant

## 2022-09-26 ENCOUNTER — Encounter: Payer: Self-pay | Admitting: Physician Assistant

## 2022-09-26 DIAGNOSIS — D72820 Lymphocytosis (symptomatic): Secondary | ICD-10-CM

## 2022-09-26 LAB — PATHOLOGIST SMEAR REVIEW
Basophils Absolute: 0.1 10*3/uL (ref 0.0–0.2)
Basos: 1 %
EOS (ABSOLUTE): 0.2 10*3/uL (ref 0.0–0.4)
Eos: 2 %
Hematocrit: 39.2 % (ref 34.0–46.6)
Hemoglobin: 12.8 g/dL (ref 11.1–15.9)
Immature Grans (Abs): 0 10*3/uL (ref 0.0–0.1)
Immature Granulocytes: 0 %
Lymphocytes Absolute: 5.6 10*3/uL — ABNORMAL HIGH (ref 0.7–3.1)
Lymphs: 50 %
MCH: 30.5 pg (ref 26.6–33.0)
MCHC: 32.7 g/dL (ref 31.5–35.7)
MCV: 94 fL (ref 79–97)
Monocytes Absolute: 0.5 10*3/uL (ref 0.1–0.9)
Monocytes: 5 %
Neutrophils Absolute: 4.6 10*3/uL (ref 1.4–7.0)
Neutrophils: 42 %
Platelets: 237 10*3/uL (ref 150–450)
RBC: 4.19 x10E6/uL (ref 3.77–5.28)
RDW: 12.1 % (ref 11.7–15.4)
WBC: 11 10*3/uL — ABNORMAL HIGH (ref 3.4–10.8)

## 2022-09-27 ENCOUNTER — Inpatient Hospital Stay: Payer: Medicare Other

## 2022-09-27 ENCOUNTER — Inpatient Hospital Stay: Payer: Medicare Other | Attending: Internal Medicine | Admitting: Internal Medicine

## 2022-09-27 ENCOUNTER — Encounter: Payer: Self-pay | Admitting: Internal Medicine

## 2022-09-27 VITALS — BP 128/71 | HR 75 | Temp 98.6°F | Resp 20 | Wt 193.6 lb

## 2022-09-27 DIAGNOSIS — Z87891 Personal history of nicotine dependence: Secondary | ICD-10-CM | POA: Diagnosis not present

## 2022-09-27 DIAGNOSIS — R109 Unspecified abdominal pain: Secondary | ICD-10-CM | POA: Diagnosis not present

## 2022-09-27 DIAGNOSIS — R232 Flushing: Secondary | ICD-10-CM | POA: Diagnosis not present

## 2022-09-27 DIAGNOSIS — C9110 Chronic lymphocytic leukemia of B-cell type not having achieved remission: Secondary | ICD-10-CM | POA: Diagnosis present

## 2022-09-27 DIAGNOSIS — D72820 Lymphocytosis (symptomatic): Secondary | ICD-10-CM | POA: Diagnosis not present

## 2022-09-27 LAB — CBC WITH DIFFERENTIAL/PLATELET
Abs Immature Granulocytes: 0.03 10*3/uL (ref 0.00–0.07)
Basophils Absolute: 0.1 10*3/uL (ref 0.0–0.1)
Basophils Relative: 1 %
Eosinophils Absolute: 0.2 10*3/uL (ref 0.0–0.5)
Eosinophils Relative: 2 %
HCT: 41.7 % (ref 36.0–46.0)
Hemoglobin: 13.9 g/dL (ref 12.0–15.0)
Immature Granulocytes: 0 %
Lymphocytes Relative: 49 %
Lymphs Abs: 5.7 10*3/uL — ABNORMAL HIGH (ref 0.7–4.0)
MCH: 30.8 pg (ref 26.0–34.0)
MCHC: 33.3 g/dL (ref 30.0–36.0)
MCV: 92.3 fL (ref 80.0–100.0)
Monocytes Absolute: 0.5 10*3/uL (ref 0.1–1.0)
Monocytes Relative: 4 %
Neutro Abs: 5.2 10*3/uL (ref 1.7–7.7)
Neutrophils Relative %: 44 %
Platelets: 230 10*3/uL (ref 150–400)
RBC: 4.52 MIL/uL (ref 3.87–5.11)
RDW: 12.3 % (ref 11.5–15.5)
WBC: 11.8 10*3/uL — ABNORMAL HIGH (ref 4.0–10.5)
nRBC: 0 % (ref 0.0–0.2)

## 2022-09-27 LAB — LACTATE DEHYDROGENASE: LDH: 115 U/L (ref 98–192)

## 2022-09-27 NOTE — Progress Notes (Signed)
[place]  [phone] [phone]  ID: Natalie Eaton OB: Apr 16, 1956  MR#: [id number]  KZL#:[id number]  Patient Care Team: [name], Hershal Coria as PCP - General (Physician Assistant)  REFERRING PROVIDER: [name]  REASON FOR REFERRAL: lymphocytosis  HPI: Natalie Eaton is a 67 y.o. female with past medical history of CAD, prediabetes, hypothyroidism, hypertension, hysterectomy, TBI was referred to hematology for workup of lymphocytosis.  Patient reports hot flashes during the day and at night for about 1 year.  During the day, when she has an episode of hot flashes she feels lightheaded at the same time.  She has hair loss.  Denies any dizziness or blurry vision.  Denies any weight loss, decreased appetite, fever, chills, lumps.    Labs reviewed. Patient has elevated WBC since 2019.  Predominantly lymphocytosis more than 5000.  Hemoglobin and platelets are normal.   REVIEW OF SYSTEMS:   ROS  As per HPI. Otherwise, a complete review of systems is negative.  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, per gyn   Hypertension    Hypothyroidism    Insomnia    MVP (mitral valve prolapse)    Pneumonia    Pre-diabetes     PAST SURGICAL HISTORY: Past Surgical History:  Procedure Laterality Date   ABDOMINAL HYSTERECTOMY     BLADDER SURGERY     CARDIAC CATHETERIZATION     GALLBLADDER SURGERY     LUNG REMOVAL, PARTIAL     ROTATOR CUFF REPAIR     TONSILLECTOMY     TUBAL LIGATION      FAMILY HISTORY: Family History  Problem Relation Age of Onset   Healthy Mother    Scoliosis Mother    Heart disease Father    Heart disease Brother    Healthy Daughter    Healthy Son     HEALTH  MAINTENANCE: Social History   Tobacco Use   Smoking status: Former    Packs/day: 0.25    Years: 15.00    Total pack years: 3.75    Types: Cigarettes    Quit date: 1996    Years since quitting: 28.0   Smokeless tobacco: Never  Vaping Use   Vaping Use: Never used  Substance Use Topics   Alcohol use: Yes    Alcohol/week: 1.0 standard drink of alcohol    Types: 1 Glasses of wine per week   Drug use: No     Allergies  Allergen Reactions   Codeine Other (See Comments)    Tingling on her scalp.    Current Outpatient Medications  Medication Sig Dispense Refill   aspirin 81 MG chewable tablet Chew 81 mg by mouth daily.     celecoxib (CELEBREX) 200 MG capsule Take 1 capsule (200 mg total) by mouth 2 (two) times daily. 180 capsule 1   gabapentin (NEURONTIN) 300 MG capsule Take 1 capsule (300 mg total) by mouth 3 (three) times daily. 270 capsule 1   hydrochlorothiazide (HYDRODIURIL) 25 MG tablet Take 1 tablet (25 mg total) by mouth daily. 90 tablet 3   levothyroxine (SYNTHROID) 88 MCG tablet Take 1 tablet (88 mcg total) by mouth daily. 90 tablet 3  methocarbamol (ROBAXIN) 750 MG tablet Take 1 tablet (750 mg total) by mouth every 8 (eight) hours as needed for muscle spasms. 120 tablet 0   Multiple Vitamin (MULTIVITAMIN ADULT PO) Take 1 each by mouth daily at 2 PM.     pantoprazole (PROTONIX) 40 MG tablet Take 1 tablet (40 mg total) by mouth daily. 90 tablet 1   potassium chloride (MICRO-K) 10 MEQ CR capsule Take 1 capsule (10 mEq total) by mouth 2 (two) times daily. 180 capsule 1   rosuvastatin (CRESTOR) 10 MG tablet Take 1 tablet (10 mg total) by mouth daily. 90 tablet 1   traMADol (ULTRAM) 50 MG tablet Take 50 mg by mouth 3 (three) times daily.     traZODone (DESYREL) 100 MG tablet Take 100 mg by mouth at bedtime.     valACYclovir (VALTREX) 500 MG tablet Take 500 mg by mouth daily. As needed  4   vitamin E 400 UNIT capsule Take 400 Units by mouth daily. (Patient not taking: Reported  on 09/27/2022)     No current facility-administered medications for this visit.    OBJECTIVE: Vitals:   09/27/22 1125  BP: 128/71  Pulse: 75  Resp: 20  Temp: 98.6 F (37 C)  SpO2: 100%     Body mass index is 33.23 kg/m.      General: Well-developed, well-nourished, no acute distress. Eyes: Pink conjunctiva, anicteric sclera. HEENT: Normocephalic, moist mucous membranes, clear oropharnyx. Lungs: Clear to auscultation bilaterally. Heart: Regular rate and rhythm. No rubs, murmurs, or gallops. Abdomen: Soft, nontender, nondistended. No organomegaly noted, normoactive bowel sounds. Musculoskeletal: No edema, cyanosis, or clubbing. Neuro: Alert, answering all questions appropriately. Cranial nerves grossly intact. Skin: No rashes or petechiae noted. Psych: Normal affect. Lymphatics: No cervical, calvicular, axillary or inguinal LAD.   LAB RESULTS:  Lab Results  Component Value Date   NA 142 07/04/2022   K 4.6 07/04/2022   CL 105 07/04/2022   CO2 21 07/04/2022   GLUCOSE 84 07/04/2022   BUN 22 07/04/2022   CREATININE 0.77 07/04/2022   CALCIUM 9.8 07/04/2022   PROT 6.7 07/04/2022   ALBUMIN 4.4 09/01/2020   AST 15 07/04/2022   ALT 16 07/04/2022   ALKPHOS 89 09/01/2020   BILITOT 0.4 07/04/2022   GFRNONAA 91 09/01/2020   GFRAA 105 09/01/2020    Lab Results  Component Value Date   WBC 11.8 (H) 09/27/2022   NEUTROABS 5.2 09/27/2022   HGB 13.9 09/27/2022   HCT 41.7 09/27/2022   MCV 92.3 09/27/2022   PLT 230 09/27/2022    No results found for: "TIBC", "FERRITIN", "IRONPCTSAT"   STUDIES: No results found.  ASSESSMENT AND PLAN:   Natalie Eaton is a 67 y.o. female with pmh of CAD, prediabetes, hypothyroidism, hypertension, hysterectomy, TBI was referred to hematology for workup of lymphocytosis.  # Leukocytosis, lymphocyte predominant -Of unknown etiology  - patient has elevated WBC since 2019.  ALC more than 5000 in the peripheral blood.  I discussed with the  patient about my suspicion for CLL.  I will obtain flow cytometry, LDH level and repeat CBC with differential today.  Discussed with the patient that CLL is usually indolent lymphoma which can be monitored for several years without needing treatment.  Her hemoglobin and platelets are normal.  She has hot flashes for past 1 year.  Otherwise she does not have any other B symptoms.  I will follow-up with her in 1 week to discuss the labs.   Orders Placed This Encounter  Procedures   CBC with Differential   Flow cytometry panel-leukemia/lymphoma work-up   Lactate dehydrogenase   RTC in 1 week for MD visit to discuss labs  Patient expressed understanding and was in agreement with this plan. She also understands that She can call clinic at any time with any questions, concerns, or complaints.   I spent a total of 45 minutes reviewing chart data, face-to-face evaluation with the patient, counseling and coordination of care as detailed above.  Jane Canary, MD   09/27/2022 12:57 PM

## 2022-09-27 NOTE — Progress Notes (Signed)
Patient has no concerns today. 

## 2022-09-30 ENCOUNTER — Encounter: Payer: Self-pay | Admitting: Internal Medicine

## 2022-09-30 LAB — COMP PANEL: LEUKEMIA/LYMPHOMA: Immunophenotypic Profile: 26

## 2022-10-03 ENCOUNTER — Inpatient Hospital Stay (HOSPITAL_BASED_OUTPATIENT_CLINIC_OR_DEPARTMENT_OTHER): Payer: Medicare Other | Admitting: Internal Medicine

## 2022-10-03 ENCOUNTER — Encounter: Payer: Self-pay | Admitting: Internal Medicine

## 2022-10-03 VITALS — BP 120/90 | HR 75 | Temp 96.9°F | Resp 20 | Wt 192.5 lb

## 2022-10-03 DIAGNOSIS — G8929 Other chronic pain: Secondary | ICD-10-CM | POA: Diagnosis not present

## 2022-10-03 DIAGNOSIS — R109 Unspecified abdominal pain: Secondary | ICD-10-CM

## 2022-10-03 DIAGNOSIS — C9110 Chronic lymphocytic leukemia of B-cell type not having achieved remission: Secondary | ICD-10-CM | POA: Diagnosis not present

## 2022-10-03 LAB — COLOGUARD: COLOGUARD: NEGATIVE

## 2022-10-03 NOTE — Progress Notes (Signed)
[facility] CONSULT NOTE  Patient Care Team: Emelia Loron as PCP - General (Physician Assistant)   CANCER STAGING   Cancer Staging  CLL (chronic lymphocytic leukemia) ([place]) Staging form: Chronic Lymphocytic Leukemia / Small Lymphocytic Lymphoma, AJCC 8th Edition - Clinical stage from 10/03/2022: Modified Rai Stage 0 (Modified Rai risk: Low, Lymphocytosis: Present, Adenopathy: Absent, Organomegaly: Absent, Anemia: Absent, Thrombocytopenia: Absent) - Signed by Jane Canary, MD on 10/03/2022 Stage prefix: Initial diagnosis   ASSESSMENT & PLAN:  Natalie Eaton 67 y.o. female with pmh of CAD, prediabetes, hypothyroidism, hypertension, hysterectomy, TBI was referred to hematology for workup of lymphocytosis.  # CLL, Rai stage 0 -Flow cytometry done on 09/27/2022 showed involvement by CD5, CD23, CD20, CD22 positive clonal B-cell population.  She has peripheral lymphocytosis more than 5000 for more than 3 months.  Diagnostic for CLL.  -Today I discussed with the patient about the lab results, staging, natural history and treatment.  She has stage 0 CLL.  She has hot flashes during the day 1-2 times and at night once a week ongoing for 1 year.  No drenching sweats.  It does not affect her quality of life.  Otherwise she is asymptomatic.  Her exam was unremarkable.  Hemoglobin and platelets are normal.  There is no indication for treatment.  Will continue surveillance with H&P and labs every 3 months for 1 year and then 6 months if stable.  Patient complaining of left flank pain radiating to back ongoing for 1 month.  Likely muscular in nature. But I will get baseline CT abdomen to rule out any splenomegaly.  Will also add CT chest for baseline.  I will add IgHV mutation for next visit for prognostication.   Orders Placed This Encounter  Procedures   CBC with Differential/Platelet    Standing Status:   Future    Standing Expiration Date:   10/03/2023   IgVH Somatic  Hypermutation    Standing Status:   Future    Standing Expiration Date:   10/04/2023   Comprehensive metabolic panel    Standing Status:   Future    Standing Expiration Date:   10/03/2023   Lactate dehydrogenase    Standing Status:   Future    Standing Expiration Date:   10/04/2023   RTC in 3 months for MD visit, labs. Schedule CT chest abdomen pelvis in 4 to 6 weeks.  The total time spent in the appointment was 30 minutes encounter with patients including review of chart and various tests results, discussions about plan of care and coordination of care plan   All questions were answered. The patient knows to call the clinic with any problems, questions or concerns. No barriers to learning was detected.  Jane Canary, MD 1/18/202411:11 AM   HISTORY OF PRESENTING ILLNESS:  Natalie Eaton 67 y.o. female with pmh of CAD, prediabetes, hypothyroidism, hypertension, hysterectomy, TBI was referred to hematology for workup of lymphocytosis.   Patient reports hot flashes during the day and at night for about 1 year.  During the day, when she has an episode of hot flashes she feels lightheaded at the same time.  She has hair loss.  Denies any dizziness or blurry vision.  Denies any weight loss, decreased appetite, fever, chills, lumps.     Labs reviewed. Patient has elevated WBC since 2019.  Predominantly lymphocytosis more than 5000.  Hemoglobin and platelets are normal  Interval history Patient was seen today to discuss labs. Denies any new complaints  since last visits.  Continues to have hot flashes once or twice a day and at night maybe once a week.  This been ongoing for 1 year.  No drenching sweats.  Today she mentioned about intermittent sharp pains in the left side of her abdomen which radiates around her back has been ongoing for 1 month.  Stable.  I have reviewed her chart and materials related to her cancer extensively and collaborated history with the patient. Summary of oncologic  history is as follows: Oncology History  CLL (chronic lymphocytic leukemia) ([place])  10/03/2022 Initial Diagnosis   CLL (chronic lymphocytic leukemia) ([place])   10/03/2022 Cancer Staging   Staging form: Chronic Lymphocytic Leukemia / Small Lymphocytic Lymphoma, AJCC 8th Edition - Clinical stage from 10/03/2022: Modified Rai Stage 0 (Modified Rai risk: Low, Lymphocytosis: Present, Adenopathy: Absent, Organomegaly: Absent, Anemia: Absent, Thrombocytopenia: Absent) - Signed by Jane Canary, MD on 10/03/2022 Stage prefix: Initial diagnosis     MEDICAL HISTORY:  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[place])    Coronary artery disease    Epilepsy ([place])    Heart murmur    History of hysterectomy    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, per gyn   Hypertension    Hypothyroidism    Insomnia    MVP (mitral valve prolapse)    Pneumonia    Pre-diabetes     SURGICAL HISTORY: Past Surgical History:  Procedure Laterality Date   ABDOMINAL HYSTERECTOMY     BLADDER SURGERY     CARDIAC CATHETERIZATION     GALLBLADDER SURGERY     LUNG REMOVAL, PARTIAL     ROTATOR CUFF REPAIR     TONSILLECTOMY     TUBAL LIGATION      SOCIAL HISTORY: Social History   Socioeconomic History   Marital status: Widowed    Spouse name: Not on file   Number of children: 2   Years of education: Not on file   Highest education level: Not on file  Occupational History   Not on file  Tobacco Use   Smoking status: Former    Packs/day: 0.25    Years: 15.00    Total pack years: 3.75    Types: Cigarettes    Quit date: 84    Years since quitting: 28.0   Smokeless tobacco: Never  Vaping Use   Vaping Use: Never used  Substance and Sexual Activity   Alcohol use: Yes    Alcohol/week: 1.0 standard drink of alcohol    Types: 1 Glasses of wine per week   Drug use: No   Sexual activity: Not Currently    Birth  control/protection: None  Other Topics Concern   Not on file  Social History Narrative   Not on file   Social Determinants of Health   Financial Resource Strain: Not on file  Food Insecurity: Not on file  Transportation Needs: Not on file  Physical Activity: Not on file  Stress: Not on file  Social Connections: Not on file  Intimate Partner Violence: Not on file    FAMILY HISTORY: Family History  Problem Relation Age of Onset   Healthy Mother    Scoliosis Mother    Heart disease Father    Heart disease Brother    Healthy Daughter    Healthy Son     ALLERGIES:  is allergic to codeine.  MEDICATIONS:  Current Outpatient Medications  Medication Sig Dispense Refill   aspirin 81 MG chewable tablet Chew 81 mg by mouth daily.     celecoxib (CELEBREX) 200 MG capsule Take 1 capsule (200 mg total) by mouth 2 (two) times daily. 180 capsule 1   gabapentin (NEURONTIN) 300 MG capsule Take 1 capsule (300 mg total) by mouth 3 (three) times daily. 270 capsule 1   hydrochlorothiazide (HYDRODIURIL) 25 MG tablet Take 1 tablet (25 mg total) by mouth daily. 90 tablet 3   levothyroxine (SYNTHROID) 88 MCG tablet Take 1 tablet (88 mcg total) by mouth daily. 90 tablet 3   methocarbamol (ROBAXIN) 750 MG tablet Take 1 tablet (750 mg total) by mouth every 8 (eight) hours as needed for muscle spasms. 120 tablet 0   Multiple Vitamin (MULTIVITAMIN ADULT PO) Take 1 each by mouth daily at 2 PM.     pantoprazole (PROTONIX) 40 MG tablet Take 1 tablet (40 mg total) by mouth daily. 90 tablet 1   potassium chloride (MICRO-K) 10 MEQ CR capsule Take 1 capsule (10 mEq total) by mouth 2 (two) times daily. 180 capsule 1   rosuvastatin (CRESTOR) 10 MG tablet Take 1 tablet (10 mg total) by mouth daily. 90 tablet 1   traMADol (ULTRAM) 50 MG tablet Take 50 mg by mouth 3 (three) times daily.     traZODone (DESYREL) 100 MG tablet Take 100 mg by mouth at bedtime.     valACYclovir (VALTREX) 500 MG tablet Take 500 mg by  mouth daily. As needed  4   vitamin E 400 UNIT capsule Take 400 Units by mouth daily. (Patient not taking: Reported on 09/27/2022)     No current facility-administered medications for this visit.    REVIEW OF SYSTEMS:   Pertinent information mentioned in HPI All other systems were reviewed with the patient and are negative.  PHYSICAL EXAMINATION: ECOG PERFORMANCE STATUS: 1 - Symptomatic but completely ambulatory  Vitals:   10/03/22 1014  BP: (!) 120/90  Pulse: 75  Resp: 20  Temp: (!) 96.9 F (36.1 C)  SpO2: 100%   Filed Weights   10/03/22 1014  Weight: 192 lb 8 oz (87.3 kg)    GENERAL:alert, no distress and comfortable SKIN: skin color, texture, turgor are normal, no rashes or significant lesions EYES: normal, conjunctiva are pink and non-injected, sclera clear OROPHARYNX:no exudate, no erythema and lips, buccal mucosa, and tongue normal  NECK: supple, thyroid normal size, non-tender, without nodularity LYMPH:  no palpable lymphadenopathy in the cervical, axillary or inguinal LUNGS: clear to auscultation and percussion with normal breathing effort HEART: regular rate & rhythm and no murmurs and no lower extremity edema ABDOMEN:abdomen soft, non-tender and normal bowel sounds Musculoskeletal:no cyanosis of digits and no clubbing  PSYCH: alert & oriented x 3 with fluent speech NEURO: no focal motor/sensory deficits  LABORATORY DATA:  I have reviewed the data as listed Lab Results  Component Value Date   WBC 11.8 (H) 09/27/2022   HGB 13.9 09/27/2022   HCT 41.7 09/27/2022   MCV 92.3 09/27/2022   PLT 230 09/27/2022   Recent Labs    07/04/22 1043  NA 142  K 4.6  CL 105  CO2 21  GLUCOSE 84  BUN 22  CREATININE 0.77  CALCIUM 9.8  PROT 6.7  AST 15  ALT 16  BILITOT 0.4    RADIOGRAPHIC STUDIES: I have personally reviewed the radiological images as listed and agreed with the findings in the report. No results found.

## 2022-10-03 NOTE — Progress Notes (Signed)
Patient states she having pain on her left side under her rib cage. Patient states pain sometimes shoots to her back.

## 2022-10-04 ENCOUNTER — Ambulatory Visit: Payer: Medicare Other | Admitting: Internal Medicine

## 2022-10-06 ENCOUNTER — Encounter: Payer: Self-pay | Admitting: Physician Assistant

## 2022-10-07 ENCOUNTER — Ambulatory Visit (INDEPENDENT_AMBULATORY_CARE_PROVIDER_SITE_OTHER): Payer: Medicare Other | Admitting: Family Medicine

## 2022-10-07 ENCOUNTER — Ambulatory Visit: Payer: Self-pay

## 2022-10-07 ENCOUNTER — Other Ambulatory Visit: Payer: Self-pay | Admitting: Nurse Practitioner

## 2022-10-07 ENCOUNTER — Encounter: Payer: Self-pay | Admitting: Family Medicine

## 2022-10-07 VITALS — BP 127/80 | HR 72 | Temp 97.8°F | Wt 195.9 lb

## 2022-10-07 DIAGNOSIS — R109 Unspecified abdominal pain: Secondary | ICD-10-CM | POA: Diagnosis not present

## 2022-10-07 DIAGNOSIS — E039 Hypothyroidism, unspecified: Secondary | ICD-10-CM

## 2022-10-07 LAB — POCT URINALYSIS DIPSTICK
Bilirubin, UA: NEGATIVE
Blood, UA: NEGATIVE
Glucose, UA: NEGATIVE
Ketones, UA: NEGATIVE
Leukocytes, UA: NEGATIVE
Nitrite, UA: NEGATIVE
Protein, UA: NEGATIVE
Spec Grav, UA: 1.015 (ref 1.010–1.025)
Urobilinogen, UA: 0.2 E.U./dL
pH, UA: 6 (ref 5.0–8.0)

## 2022-10-07 NOTE — Assessment & Plan Note (Addendum)
Acute on chronic, self limiting -negative CVA tenderness; negative POCT UA -endorses more UOP; denies frequency, burning, or incontinence -non febrile  -negative for abdominal tenderness; positive for rebound pain however, very site dependent -non toxic appearance -all extremities warm to touch; VSS -reassurance provided; continue with CT scan for 1/23 and f/u with oncology given CLL hx -defer tramadol refill given chronic in nature and upcoming f/u with ortho for hip eval -uses both knee brace (L knee) and cane; prior to this event

## 2022-10-07 NOTE — Telephone Encounter (Signed)
Patient has appointment today with Tally Joe

## 2022-10-07 NOTE — Progress Notes (Signed)
I,Connie R Striblin,acting as a Education administrator for Gwyneth Sprout, FNP.,have documented all relevant documentation on the behalf of Gwyneth Sprout, FNP,as directed by  Gwyneth Sprout, FNP while in the presence of Gwyneth Sprout, FNP.  Established patient visit  Patient: Natalie Eaton   DOB: Nov 10, 1955   67 y.o. Female  MRN: [id number] Visit Date: 10/07/2022  Today's healthcare provider: Gwyneth Sprout, FNP   Introduced to nurse practitioner role and practice setting.  All questions answered.  Discussed provider/patient relationship and expectations.  Subjective    HPI  Pain  She reports chronic L flank pain. was not an injury that may have caused the pain. The pain started a few weeks ago and is worsening. The pain is described as sharp, is 10/10 in intensity, occurring constantly. PT states that she has had the pain for 3-4 months. Pain has gotten much worse in the past few weeks. Oncology ordered testing, not appts for testing until mid February.  Ct scan scheduled for 10/08/22, per oncology  ---------------------------------------------------------------------------------------------------  Medications: Outpatient Medications Prior to Visit  Medication Sig   aspirin 81 MG chewable tablet Chew 81 mg by mouth daily.   celecoxib (CELEBREX) 200 MG capsule Take 1 capsule (200 mg total) by mouth 2 (two) times daily.   gabapentin (NEURONTIN) 300 MG capsule Take 1 capsule (300 mg total) by mouth 3 (three) times daily.   hydrochlorothiazide (HYDRODIURIL) 25 MG tablet Take 1 tablet (25 mg total) by mouth daily.   levothyroxine (SYNTHROID) 88 MCG tablet Take 1 tablet (88 mcg total) by mouth daily.   methocarbamol (ROBAXIN) 750 MG tablet Take 1 tablet (750 mg total) by mouth every 8 (eight) hours as needed for muscle spasms.   Multiple Vitamin (MULTIVITAMIN ADULT PO) Take 1 each by mouth daily at 2 PM.   pantoprazole (PROTONIX) 40 MG tablet Take 1 tablet (40 mg total) by mouth daily.   potassium chloride  (MICRO-K) 10 MEQ CR capsule Take 1 capsule (10 mEq total) by mouth 2 (two) times daily.   rosuvastatin (CRESTOR) 10 MG tablet Take 1 tablet (10 mg total) by mouth daily.   traMADol (ULTRAM) 50 MG tablet Take 50 mg by mouth 3 (three) times daily.   traZODone (DESYREL) 100 MG tablet Take 100 mg by mouth at bedtime.   valACYclovir (VALTREX) 500 MG tablet Take 500 mg by mouth daily. As needed   [DISCONTINUED] vitamin E 400 UNIT capsule Take 400 Units by mouth daily. (Patient not taking: Reported on 09/27/2022)   No facility-administered medications prior to visit.    Review of Systems    Objective    BP 127/80 (BP Location: Right Arm, Patient Position: Sitting, Cuff Size: Large)   Pulse 72   Temp 97.8 F (36.6 C) (Oral)   Wt 195 lb 14.4 oz (88.9 kg)   SpO2 99%   BMI 33.63 kg/m   Physical Exam Vitals and nursing note reviewed.  Constitutional:      General: She is not in acute distress.    Appearance: Normal appearance. She is overweight. She is not ill-appearing, toxic-appearing or diaphoretic.  HENT:     Head: Normocephalic and atraumatic.  Cardiovascular:     Rate and Rhythm: Normal rate and regular rhythm.     Pulses: Normal pulses.     Heart sounds: Normal heart sounds. No murmur heard.    No friction rub. No gallop.  Pulmonary:     Effort: Pulmonary effort is normal. No respiratory distress.  Breath sounds: Normal breath sounds. No stridor. No wheezing, rhonchi or rales.  Chest:     Chest wall: No tenderness.  Abdominal:     General: Bowel sounds are normal.     Palpations: Abdomen is soft. There is no hepatomegaly or splenomegaly.     Tenderness: There is abdominal tenderness. There is rebound. There is no right CVA tenderness, left CVA tenderness or guarding. Negative signs include Murphy's sign, Rovsing's sign, McBurney's sign, psoas sign and obturator sign.     Hernia: No hernia is present.    Musculoskeletal:        General: No swelling, tenderness, deformity  or signs of injury. Normal range of motion.     Right lower leg: No edema.     Left lower leg: No edema.  Skin:    General: Skin is warm and dry.     Capillary Refill: Capillary refill takes less than 2 seconds.     Coloration: Skin is not jaundiced or pale.     Findings: No bruising, erythema, lesion or rash.  Neurological:     General: No focal deficit present.     Mental Status: She is alert and oriented to person, place, and time. Mental status is at baseline.     Cranial Nerves: No cranial nerve deficit.     Sensory: No sensory deficit.     Motor: No weakness.     Coordination: Coordination normal.  Psychiatric:        Mood and Affect: Mood normal.        Behavior: Behavior normal.        Thought Content: Thought content normal.        Judgment: Judgment normal.     Results for orders placed or performed in visit on 10/07/22  POCT Urinalysis Dipstick  Result Value Ref Range   Color, UA yellow    Clarity, UA clear    Glucose, UA Negative Negative   Bilirubin, UA negative    Ketones, UA negative    Spec Grav, UA 1.015 1.010 - 1.025   Blood, UA negative    pH, UA 6.0 5.0 - 8.0   Protein, UA Negative Negative   Urobilinogen, UA 0.2 0.2 or 1.0 E.U./dL   Nitrite, UA negative    Leukocytes, UA Negative Negative   Appearance     Odor      Assessment & Plan     Problem List Items Addressed This Visit       Other   Flank pain - Primary    Acute on chronic, self limiting -negative CVA tenderness; negative POCT UA -endorses more UOP; denies frequency, burning, or incontinence -non febrile  -negative for abdominal tenderness; positive for rebound pain however, very site dependent -non toxic appearance -all extremities warm to touch; VSS -reassurance provided; continue with CT scan for 1/23 and f/u with oncology given CLL hx -defer tramadol refill given chronic in nature and upcoming f/u with ortho for hip eval -uses both knee brace (L knee) and cane; prior to this  event      Relevant Orders   POCT Urinalysis Dipstick (Completed)   Return if symptoms worsen or fail to improve.     Vonna Kotyk, FNP, have reviewed all documentation for this visit. The documentation on 10/07/22 for the exam, diagnosis, procedures, and orders are all accurate and complete.  Gwyneth Sprout, [place] [phone] (phone) [phone] (fax)  [place]

## 2022-10-07 NOTE — Telephone Encounter (Signed)
  Chief Complaint: Side pain between ribs and waist - goes around to her back Symptoms: Pain was intermittent , now constant Frequency: 3-4 for intermittent - a few weeks for constant Pertinent Negatives: Patient denies  Disposition: '[]'$ ED /'[]'$ Urgent Care (no appt availability in office) / '[x]'$ Appointment(In office/virtual)/ '[]'$  [facility]/ '[]'$ Home Care/ '[]'$ Refused Recommended Disposition /'[]'$ [facility]/ '[]'$  Follow-up with PCP Additional Notes: PT states that she has had the pain for 3-4 months. Pain has gotten much worse in the past few weeks. Oncology ordered  testing, not appts for testing until mid February.   Reason for Disposition  [1] MILD-MODERATE pain AND [2] constant AND [3] present > 2 hours  Answer Assessment - Initial Assessment Questions 1. LOCATION: "Where does it hurt?"      Left side pain between ribs and waist 2. RADIATION: "Does the pain shoot anywhere else?" (e.g., chest, back)     Around to back 3. ONSET: "When did the pain begin?" (e.g., minutes, hours or days ago)      3-4 months 4. SUDDEN: "Gradual or sudden onset?"     Gradual 5. PATTERN "Does the pain come and go, or is it constant?"    - If it comes and goes: "How long does it last?" "Do you have pain now?"     (Note: Comes and goes means the pain is intermittent. It goes away completely between bouts.)    - If constant: "Is it getting better, staying the same, or getting worse?"      (Note: Constant means the pain never goes away completely; most serious pain is constant and gets worse.)      Did come and go, now constant 6. SEVERITY: "How bad is the pain?"  (e.g., Scale 1-10; mild, moderate, or severe)    - MILD (1-3): Doesn't interfere with normal activities, abdomen soft and not tender to touch.     - MODERATE (4-7): Interferes with normal activities or awakens from sleep, abdomen tender to touch.     - SEVERE (8-10): Excruciating pain, doubled over, unable to do any normal activities.        Severe 7. RECURRENT SYMPTOM: "Have you ever had this type of stomach pain before?" If Yes, ask: "When was the last time?" and "What happened that time?"      no 8. CAUSE: "What do you think is causing the stomach pain?"     not 9. RELIEVING/AGGRAVATING FACTORS: "What makes it better or worse?" (e.g., antacids, bending or twisting motion, bowel movement)     Tramadol makes it better 10. OTHER SYMPTOMS: "Do you have any other symptoms?" (e.g., back pain, diarrhea, fever, urination pain, vomiting)        11. PREGNANCY: "Is there any chance you are pregnant?" "When was your last menstrual period?"  Protocols used: Abdominal Pain - [facility]

## 2022-10-08 ENCOUNTER — Ambulatory Visit
Admission: RE | Admit: 2022-10-08 | Discharge: 2022-10-08 | Disposition: A | Discharge status: Home / Self Care | Payer: Medicare Other | Source: Ambulatory Visit | Attending: Internal Medicine | Admitting: Internal Medicine

## 2022-10-08 DIAGNOSIS — C9110 Chronic lymphocytic leukemia of B-cell type not having achieved remission: Secondary | ICD-10-CM

## 2022-10-08 DIAGNOSIS — G8929 Other chronic pain: Secondary | ICD-10-CM | POA: Insufficient documentation

## 2022-10-08 DIAGNOSIS — R109 Unspecified abdominal pain: Secondary | ICD-10-CM | POA: Diagnosis present

## 2022-10-08 DIAGNOSIS — R10A2 Flank pain, left side: Secondary | ICD-10-CM

## 2022-10-08 LAB — POCT I-STAT CREATININE: Creatinine, Ser: 0.9 mg/dL (ref 0.44–1.00)

## 2022-10-08 MED ORDER — IOHEXOL 300 MG/ML  SOLN
100.0000 mL | Freq: Once | INTRAMUSCULAR | Status: AC | PRN
  Administered 2022-10-08: 100 mL via INTRAVENOUS

## 2022-10-09 ENCOUNTER — Encounter: Payer: Self-pay | Admitting: Internal Medicine

## 2022-10-15 ENCOUNTER — Encounter: Payer: Self-pay | Admitting: Physician Assistant

## 2022-10-15 ENCOUNTER — Ambulatory Visit (INDEPENDENT_AMBULATORY_CARE_PROVIDER_SITE_OTHER): Payer: Medicare Other | Admitting: Physician Assistant

## 2022-10-15 VITALS — BP 133/81 | HR 71 | Ht 67.0 in | Wt 195.5 lb

## 2022-10-15 DIAGNOSIS — R1012 Left upper quadrant pain: Secondary | ICD-10-CM

## 2022-10-15 DIAGNOSIS — K439 Ventral hernia without obstruction or gangrene: Secondary | ICD-10-CM | POA: Insufficient documentation

## 2022-10-15 DIAGNOSIS — E669 Obesity, unspecified: Secondary | ICD-10-CM

## 2022-10-15 DIAGNOSIS — R160 Hepatomegaly, not elsewhere classified: Secondary | ICD-10-CM

## 2022-10-15 DIAGNOSIS — J984 Other disorders of lung: Secondary | ICD-10-CM | POA: Diagnosis not present

## 2022-10-15 DIAGNOSIS — R7303 Prediabetes: Secondary | ICD-10-CM

## 2022-10-15 NOTE — Assessment & Plan Note (Signed)
PT reports a history of emphysema, history of smoking but does not currently smoke. Denies cough, SOB. Ref to pulm for further eval.

## 2022-10-15 NOTE — Assessment & Plan Note (Addendum)
Discussed diet and exercise, referred to dietician/nutritionist Pt is not an ideal candidate for weight loss medications -- advised insurance would not cover  I am not confident in adherence when paid for out of pocket.  I am not confident that she would adhere to lifestyle changes and diet while on agents like a GLP 1

## 2022-10-15 NOTE — Assessment & Plan Note (Signed)
Noted on CT scan Abdominal exam benign  Advised worrisome signs and symptoms of a hernia.

## 2022-10-15 NOTE — Progress Notes (Signed)
I,[name],acting as a Education administrator for [employer], PA-C.,have documented all relevant documentation on the behalf of Natalie Kirschner, PA-C,as directed by  Natalie Kirschner, PA-C while in the presence of Natalie Kirschner, PA-C.   Established patient visit   Patient: Natalie Eaton   DOB: 26-Oct-1955   67 y.o. Female  MRN: [id number] Visit Date: 10/15/2022  Today's healthcare provider: [name], PA-C   Cc. CT scan with several concerns.  Subjective    HPI  Understand CT results and get any necessary referrals.   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 She has also been having some L flank pain -- saw orthopedics who were concerned about intra-abdominal pathology.  Pt also has some concerns about weight loss and has interest in paying for weight loss injections out of pocket.   Medications: Outpatient Medications Prior to Visit  Medication Sig   aspirin 81 MG chewable tablet Chew 81 mg by mouth daily.   celecoxib (CELEBREX) 200 MG capsule Take 1 capsule (200 mg total) by mouth 2 (two) times daily.   gabapentin (NEURONTIN) 300 MG capsule Take 1 capsule (300 mg total) by mouth 3 (three) times daily.   hydrochlorothiazide (HYDRODIURIL) 25 MG tablet Take 1 tablet (25 mg total) by mouth daily.   levothyroxine (SYNTHROID) 88 MCG tablet Take 1 tablet (88 mcg total) by mouth daily.   methocarbamol (ROBAXIN) 750 MG tablet Take 1 tablet (750 mg total) by mouth every 8 (eight) hours as needed for muscle spasms.   Multiple Vitamin (MULTIVITAMIN ADULT PO) Take 1 each by mouth daily at 2 PM.   pantoprazole (PROTONIX) 40 MG tablet Take 1 tablet (40 mg total) by mouth daily.   potassium chloride (MICRO-K) 10 MEQ CR capsule Take 1 capsule (10 mEq total) by mouth 2 (two) times daily.   rosuvastatin (CRESTOR) 10 MG tablet Take 1 tablet (10 mg total) by mouth daily.   traMADol (ULTRAM) 50 MG tablet Take 50 mg by mouth 3 (three)  times daily.   traZODone (DESYREL) 100 MG tablet Take 100 mg by mouth at bedtime.   valACYclovir (VALTREX) 500 MG tablet Take 500 mg by mouth daily. As needed   No facility-administered medications prior to visit.       Objective    Blood pressure 133/81, pulse 71, height '5\' 7"'$  (1.702 m), weight 195 lb 8 oz (88.7 kg), SpO2 100 %.   Physical Exam Constitutional:      General: She is awake.     Appearance: She is well-developed.  HENT:     Head: Normocephalic.  Eyes:     Conjunctiva/sclera: Conjunctivae normal.  Cardiovascular:     Rate and Rhythm: Normal rate and regular rhythm.     Heart sounds: Normal heart sounds.  Pulmonary:     Effort: Pulmonary effort is normal.     Breath sounds: Normal breath sounds.  Abdominal:     General: Abdomen is flat.     Palpations: Abdomen is soft.     Tenderness: There is no abdominal tenderness. There is no guarding.     Hernia: No hernia is present.  Skin:    General: Skin is warm.  Neurological:     Mental Status: She is alert and oriented to person, place, and time.  Psychiatric:        Attention and Perception: Attention normal.        Mood and Affect: Mood  normal.        Speech: Speech normal.        Behavior: Behavior is cooperative.      No results found for any visits on 10/15/22.  Assessment & Plan     Problem List Items Addressed This Visit       Respiratory   Pneumatocele of lung - Primary    PT reports a history of emphysema, history of smoking but does not currently smoke. Denies cough, SOB. Ref to pulm for further eval.       Relevant Orders   Ambulatory referral to Pulmonology     Digestive   Hepatomegaly    Likely 2/2 pt reported history of NAFLD, bile duct inflammation likely 2/2 cholecystectomy, no RUQ abdominal pain and on review of LFTs normal.  Recommending RUQ ultrasound to evaluate further       Relevant Orders   US Abdomen Limited RUQ (LIVER/GB)     Other   Obesity (BMI 30-39.9)     Discussed diet and exercise, referred to dietician/nutritionist Pt is not an ideal candidate for weight loss medications -- advised insurance would not cover  I am not confident in adherence when paid for out of pocket.  I am not confident that she would adhere to lifestyle changes and diet while on agents like a GLP 1      Relevant Orders   Amb ref to Medical Nutrition Therapy-MNT   Prediabetes    Ref to nutritionist      Relevant Orders   Amb ref to Medical Nutrition Therapy-MNT   Left upper quadrant abdominal pain    No improvement with pantoprazole Nothing concerning on CT, some stomach dilation  Advised a hpylori breath test      Relevant Orders   H. pylori breath test   Ventral hernia without obstruction or gangrene    Noted on CT scan Abdominal exam benign  Advised worrisome signs and symptoms of a hernia.       Return if symptoms worsen or fail to improve.      I, Natalie Kirschner, PA-C have reviewed all documentation for this visit. The documentation on 10/15/22  for the exam, diagnosis, procedures, and orders are all accurate and complete.  Natalie Kirschner, PA-C [facility] [address] [place], [place], [address] Office: [phone] Fax: [place]

## 2022-10-15 NOTE — Assessment & Plan Note (Signed)
Ref to nutritionist

## 2022-10-15 NOTE — Assessment & Plan Note (Signed)
Likely 2/2 pt reported history of NAFLD, bile duct inflammation likely 2/2 cholecystectomy, no RUQ abdominal pain and on review of LFTs normal.  Recommending RUQ ultrasound to evaluate further

## 2022-10-15 NOTE — Assessment & Plan Note (Signed)
No improvement with pantoprazole Nothing concerning on CT, some stomach dilation  Advised a hpylori breath test

## 2022-10-16 ENCOUNTER — Encounter: Payer: Self-pay | Admitting: Physician Assistant

## 2022-10-16 ENCOUNTER — Other Ambulatory Visit: Payer: Self-pay | Admitting: Physician Assistant

## 2022-10-16 DIAGNOSIS — R5383 Other fatigue: Secondary | ICD-10-CM

## 2022-10-16 DIAGNOSIS — R232 Flushing: Secondary | ICD-10-CM

## 2022-10-16 LAB — H. PYLORI BREATH TEST: H pylori Breath Test: NEGATIVE

## 2022-10-22 ENCOUNTER — Encounter: Payer: Self-pay | Admitting: Physician Assistant

## 2022-10-22 ENCOUNTER — Ambulatory Visit (INDEPENDENT_AMBULATORY_CARE_PROVIDER_SITE_OTHER): Payer: Medicare Other | Admitting: Pulmonary Disease

## 2022-10-22 ENCOUNTER — Encounter: Payer: Self-pay | Admitting: Pulmonary Disease

## 2022-10-22 ENCOUNTER — Ambulatory Visit
Admission: RE | Admit: 2022-10-22 | Discharge: 2022-10-22 | Disposition: A | Discharge status: Home / Self Care | Payer: Medicare Other | Source: Ambulatory Visit | Attending: Physician Assistant | Admitting: Physician Assistant

## 2022-10-22 VITALS — BP 138/82 | HR 71 | Temp 98.1°F | Ht 67.0 in | Wt 192.0 lb

## 2022-10-22 DIAGNOSIS — R053 Chronic cough: Secondary | ICD-10-CM

## 2022-10-22 DIAGNOSIS — K760 Fatty (change of) liver, not elsewhere classified: Secondary | ICD-10-CM | POA: Insufficient documentation

## 2022-10-22 DIAGNOSIS — R0602 Shortness of breath: Secondary | ICD-10-CM

## 2022-10-22 DIAGNOSIS — R160 Hepatomegaly, not elsewhere classified: Secondary | ICD-10-CM | POA: Diagnosis present

## 2022-10-22 DIAGNOSIS — C9110 Chronic lymphocytic leukemia of B-cell type not having achieved remission: Secondary | ICD-10-CM | POA: Diagnosis not present

## 2022-10-22 DIAGNOSIS — J984 Other disorders of lung: Secondary | ICD-10-CM

## 2022-10-22 NOTE — Patient Instructions (Signed)
We will repeat another CT chest in 3 months time.  We will order breathing tests.  We will see him in follow-up after the chest CT is done in 3 months.

## 2022-10-22 NOTE — Progress Notes (Signed)
Subjective:    Patient ID: Natalie Eaton, female    DOB: 01-13-1956, 67 y.o.   MRN: [id number] Patient Care Team: Emelia Loron as PCP - General (Physician Assistant)  Chief Complaint  Patient presents with   Follow-up    CT- 10/08/2022. Dry cough. No wheezing or SOB.     HPI Patient is a 67 year old remote former smoker with minimal tobacco exposure who presents for evaluation of an abnormal chest x-ray.  She is kindly referred by [name], PA-C.  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  She was noted to have lymphocytosis.  Part of her workup included a chest abdomen and pelvis with contrast.  The right upper lobe findings on CT showed a pneumatocele extending from the hilum to the lateral and apical pleura.  There were some small pulmonary nodules associated with this area measuring approximately 5 mm.  Findings are favored to reflect sequela of prior infection/inflammation.  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  She has previously been evaluated for suspected COPD in 2022 by Dr. Marshell Garfinkel.  She did not follow through with PFTs at that time.  She was tried on Anoro but she did not like how she felt with the medication and discontinued it.  She states that she felt worse on the medication.  At that time chest x-ray did show what appeared to be bullous disease.  She has not had any recent fevers, chills or sweats no cough or sputum production.  She has occasional dyspnea with heavy exertion but not limiting to her.  She attributes this to deconditioning.  Does not endorse any other symptomatology.  As noted she has been recently diagnosed with CLL.   Review of Systems A 10 point review of systems was performed and it is as noted above otherwise negative.  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, per gyn   Hypertension    Hypothyroidism    Insomnia    MVP (mitral valve prolapse)    Pneumonia    Pre-diabetes    Past Surgical History:  Procedure Laterality Date   ABDOMINAL HYSTERECTOMY     BLADDER SURGERY     CARDIAC CATHETERIZATION     GALLBLADDER SURGERY     LUNG REMOVAL, PARTIAL     ROTATOR CUFF REPAIR     TONSILLECTOMY     TUBAL LIGATION     Patient Active Problem List   Diagnosis Date Noted   Hepatic steatosis 10/22/2022   Left upper quadrant abdominal pain 10/15/2022   Hepatomegaly 10/15/2022   Pneumatocele of lung 10/15/2022   Ventral hernia without obstruction or gangrene 10/15/2022   Flank pain 10/07/2022   CLL (chronic lymphocytic leukemia) ([place]) 10/03/2022   Leukocytosis 09/20/2022   Epigastric pain 09/20/2022   Chronic back pain 09/20/2022   Prediabetes 07/11/2022   Arthritis 06/10/2019   MVP (mitral valve prolapse) 06/10/2019   Statin intolerance 02/17/2019   Pure hypercholesterolemia 11/16/2018   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   Obesity (BMI 30-39.9) 02/24/2018  Essential hypertension, benign 11/21/2017   Hypothyroidism 11/21/2017   Family History  Problem Relation Age of Onset   Healthy Mother    Scoliosis Mother    Heart disease Father    Heart disease Brother    Healthy Daughter    Healthy Son    Social History   Tobacco Use   Smoking status: Former    Packs/day: 0.25    Years: 15.00    Total pack years: 3.75    Types: Cigarettes    Quit date: 1996    Years since quitting: 28.1   Smokeless tobacco: Never  Substance Use Topics   Alcohol use: Yes    Alcohol/week: 1.0 standard drink of alcohol    Types: 1 Glasses of wine per week   Allergies  Allergen Reactions   Codeine Other (See Comments)    Tingling on her scalp.    Current Meds  Medication Sig   aspirin 81 MG chewable tablet Chew 81 mg by mouth daily.   celecoxib (CELEBREX) 200 MG capsule Take 1 capsule (200 mg total) by mouth 2 (two) times daily.   gabapentin (NEURONTIN) 300 MG capsule Take 1 capsule (300 mg total) by mouth 3 (three) times daily.   hydrochlorothiazide (HYDRODIURIL) 25 MG tablet Take 1 tablet (25 mg total) by mouth daily.   levothyroxine (SYNTHROID) 88 MCG tablet Take 1 tablet (88 mcg total) by mouth daily.   methocarbamol (ROBAXIN) 750 MG tablet Take 1 tablet (750 mg total) by mouth every 8 (eight) hours as needed for muscle spasms.   Multiple Vitamin (MULTIVITAMIN ADULT PO) Take 1 each by mouth daily at 2 PM.   pantoprazole (PROTONIX) 40 MG tablet Take 1 tablet (40 mg total) by mouth daily.   potassium chloride (MICRO-K) 10 MEQ CR capsule Take 1 capsule (10 mEq total) by mouth 2 (two) times daily.   rosuvastatin (CRESTOR) 10 MG tablet Take 1 tablet (10 mg total) by mouth daily.   traMADol (ULTRAM) 50 MG tablet Take 50 mg by mouth 3 (three) times daily.   traZODone (DESYREL) 100 MG tablet Take 100 mg by mouth at bedtime.   valACYclovir (VALTREX) 500 MG tablet Take 500 mg by mouth daily. As needed   Immunization History  Administered Date(s) Administered   Influenza,inj,Quad PF,6+ Mos 08/21/2018, 06/10/2019   PFIZER(Purple Top)SARS-COV-2 Vaccination 12/09/2019, 01/01/2020, 07/04/2020   Pneumococcal Conjugate-13 06/02/2020   Pneumococcal-Unspecified 09/17/2019   Tdap 09/16/2018, 08/11/2020, 02/02/2022   Unspecified SARS-COV-2 Vaccination 12/09/2019, 01/01/2020, 12/14/2020        Objective:   Physical Exam BP 138/82 (BP Location: Left Arm, Cuff Size: Normal)   Pulse 71   Temp 98.1 F (36.7 C)   Ht 5' 7"$  (1.702 m)   Wt 192 lb (87.1 kg)   SpO2 99%   BMI 30.07 kg/m   SpO2: 99 % O2 Device: None (Room air)  GENERAL: Overweight, well-developed woman, no acute distress.  Fully ambulatory.  No conversational dyspnea. HEAD:  Normocephalic, atraumatic.  EYES: Pupils equal, round, reactive to light.  No scleral icterus.  MOUTH: Dentition intact, oral mucosa moist.  No thrush. NECK: Supple. No thyromegaly. Trachea midline. No JVD.  No adenopathy. PULMONARY: Good air entry bilaterally.  No adventitious sounds. CARDIOVASCULAR: S1 and S2. Regular rate and rhythm.  No rubs, murmurs or gallops heard. ABDOMEN: Mildly protuberant, otherwise benign. MUSCULOSKELETAL: No joint deformity, no clubbing, no edema.  NEUROLOGIC: No overt focal deficit, no gait disturbance, speech is fluent. SKIN: Intact,warm,dry. PSYCH: Mood and behavior normal  Representative  image from CT scan performed 08 October 2022 showing pneumatocele on the right upper lobe, no prior film for comparison:      Assessment & Plan:     ICD-10-CM   1. Pneumatocele of lung  J98.4 Pulmonary Function Test [facility] Only    CT Chest Wo Contrast   Repeat CT chest 3 months PFTs to establish baseline lung function Solitary, likely sequela from prior infection    2. Shortness of breath  R06.02 Pulmonary Function Test [facility] Only   Mild, mostly on heavy exertion PFTs    3. Chronic cough  R05.3    Dry in nature Occurs rarely during the day    4. CLL (chronic lymphocytic leukemia) ([facility])  C91.10    This issue adds complexity to her management Follows with oncology     Orders Placed This Encounter  Procedures   CT Chest Wo Contrast    Standing Status:   Future    Standing Expiration Date:   10/23/2023    Order Specific Question:   Preferred imaging location?    Answer:   [facility]   Pulmonary Function Test [facility] Only    Standing Status:   Future    Standing Expiration Date:   10/23/2023    Order Specific Question:   Full PFT: includes the following: basic spirometry, spirometry pre & post bronchodilator, diffusion capacity (DLCO), lung volumes    Answer:   Full PFT    Order Specific Question:   This test can only be performed at    Answer:   [facility]   Patient presents for evaluation of a pneumatocele on the right upper lobe.  These are airfield cavitary lesions usually seen after infection, trauma or perhaps more extensive cystic disease of the lung.  This could also be congenital.  Given that it is a solitary lesion I suspect that this was likely related to sequela of prior infection.  She gives a history of prior surgery in the lung which if it was done appears to have been limited to just excision of a pneumatocele.  It appears that this was present previously from childhood. She does not have evidence of emphysema otherwise.  Will obtain studies as above and follow expectantly.  Will see the patient in follow-up in 3 months time she is to contact us prior to that time should any new difficulties arise.  Renold Don, MD Advanced Bronchoscopy PCCM [facility]-    *This note was dictated using voice recognition software/Dragon.  Despite best efforts to proofread, errors can occur which can change the meaning. Any transcriptional errors that result from this process are unintentional and may not be fully corrected at the time of dictation.

## 2022-10-24 ENCOUNTER — Encounter: Payer: Self-pay | Admitting: Pulmonary Disease

## 2022-10-24 NOTE — Telephone Encounter (Signed)
Dr. Gonzalez, please advise. Thanks 

## 2022-10-24 NOTE — Telephone Encounter (Signed)
I have reviewed her prior chest x-rays as well.  Unfortunately these do not help because they are not as sensitive in showing the area that the CT shows.  A CT would have been helpful.  The chest x-rays however are consistent and that there was some fibrosis (scarring) in that area so I suspect that this issue has been present for a while.

## 2022-10-26 ENCOUNTER — Encounter: Payer: Self-pay | Admitting: Pulmonary Disease

## 2022-11-06 ENCOUNTER — Other Ambulatory Visit: Payer: Medicare Other

## 2022-11-06 NOTE — Progress Notes (Unsigned)
[name], PA-C   No chief complaint on file.   HPI:      Natalie Eaton is a 67 y.o. No obstetric history on file. whose LMP was No LMP recorded. Patient is postmenopausal., presents today for NP ref for hot flashes    Patient Active Problem List   Diagnosis Date Noted   Hepatic steatosis 10/22/2022   Left upper quadrant abdominal pain 10/15/2022   Hepatomegaly 10/15/2022   Pneumatocele of lung 10/15/2022   Ventral hernia without obstruction or gangrene 10/15/2022   Flank pain 10/07/2022   CLL (chronic lymphocytic leukemia) ([place]) 10/03/2022   Leukocytosis 09/20/2022   Epigastric pain 09/20/2022   Chronic back pain 09/20/2022   Prediabetes 07/11/2022   Arthritis 06/10/2019   MVP (mitral valve prolapse) 06/10/2019   Statin intolerance 02/17/2019   Pure hypercholesterolemia 11/16/2018   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   Obesity (BMI 30-39.9) 02/24/2018   Essential hypertension, benign 11/21/2017   Hypothyroidism 11/21/2017    Past Surgical History:  Procedure Laterality Date   ABDOMINAL HYSTERECTOMY     BLADDER SURGERY     CARDIAC CATHETERIZATION     GALLBLADDER SURGERY     LUNG REMOVAL, PARTIAL     ROTATOR CUFF REPAIR     TONSILLECTOMY     TUBAL LIGATION      Family History  Problem Relation Age of Onset   Healthy Mother    Scoliosis Mother    Heart disease Father    Heart disease Brother    Healthy Daughter    Healthy Son     Social History   Socioeconomic History   Marital status: Widowed    Spouse name: Not on file   Number of children: 2   Years of education: Not on file   Highest education level: Not on file  Occupational History   Not on file  Tobacco Use   Smoking status: Former    Packs/day: 0.25    Years: 15.00    Total pack years: 3.75    Types: Cigarettes    Quit date: 72    Years since quitting: 28.1   Smokeless tobacco: Never  Vaping Use   Vaping Use: Never used  Substance and Sexual Activity    Alcohol use: Yes    Alcohol/week: 1.0 standard drink of alcohol    Types: 1 Glasses of wine per week   Drug use: No   Sexual activity: Not Currently    Birth control/protection: None  Other Topics Concern   Not on file  Social History Narrative   Not on file   Social Determinants of Health   Financial Resource Strain: Not on file  Food Insecurity: Not on file  Transportation Needs: Not on file  Physical Activity: Not on file  Stress: Not on file  Social Connections: Not on file  Intimate Partner Violence: Not on file    Outpatient Medications Prior to Visit  Medication Sig Dispense Refill   aspirin 81 MG chewable tablet Chew 81 mg by mouth daily.     celecoxib (CELEBREX) 200 MG capsule Take 1 capsule (200 mg total) by mouth 2 (two) times daily. 180 capsule 1   gabapentin (NEURONTIN) 300 MG capsule Take 1 capsule (300 mg total) by mouth 3 (three) times daily. 270 capsule 1   hydrochlorothiazide (HYDRODIURIL) 25 MG tablet Take 1 tablet (25 mg total) by mouth daily. 90 tablet 3   levothyroxine (SYNTHROID) 88 MCG tablet Take 1 tablet (88 mcg  total) by mouth daily. 90 tablet 3   methocarbamol (ROBAXIN) 750 MG tablet Take 1 tablet (750 mg total) by mouth every 8 (eight) hours as needed for muscle spasms. 120 tablet 0   Multiple Vitamin (MULTIVITAMIN ADULT PO) Take 1 each by mouth daily at 2 PM.     pantoprazole (PROTONIX) 40 MG tablet Take 1 tablet (40 mg total) by mouth daily. 90 tablet 1   potassium chloride (MICRO-K) 10 MEQ CR capsule Take 1 capsule (10 mEq total) by mouth 2 (two) times daily. 180 capsule 1   rosuvastatin (CRESTOR) 10 MG tablet Take 1 tablet (10 mg total) by mouth daily. 90 tablet 1   traMADol (ULTRAM) 50 MG tablet Take 50 mg by mouth 3 (three) times daily.     traZODone (DESYREL) 100 MG tablet Take 100 mg by mouth at bedtime.     valACYclovir (VALTREX) 500 MG tablet Take 500 mg by mouth daily. As needed  4   No facility-administered medications prior to visit.       ROS:  Review of Systems BREAST: No symptoms   OBJECTIVE:   Vitals:  There were no vitals taken for this visit.  Physical Exam  Results: No results found for this or any previous visit (from the past 24 hour(s)).   Assessment/Plan: No diagnosis found.    No orders of the defined types were placed in this encounter.     No follow-ups on file.  Rolen Conger B. Tyde Lamison, PA-C 11/06/2022 9:33 PM

## 2022-11-07 ENCOUNTER — Ambulatory Visit: Payer: Self-pay | Admitting: Physician Assistant

## 2022-11-07 ENCOUNTER — Ambulatory Visit (INDEPENDENT_AMBULATORY_CARE_PROVIDER_SITE_OTHER): Payer: Medicare Other | Admitting: Obstetrics and Gynecology

## 2022-11-07 ENCOUNTER — Encounter: Payer: Medicare Other | Admitting: Obstetrics and Gynecology

## 2022-11-07 ENCOUNTER — Encounter: Payer: Self-pay | Admitting: Obstetrics and Gynecology

## 2022-11-07 VITALS — BP 130/80 | Ht 67.5 in | Wt 189.0 lb

## 2022-11-07 DIAGNOSIS — R61 Generalized hyperhidrosis: Secondary | ICD-10-CM | POA: Diagnosis not present

## 2022-11-07 NOTE — Patient Instructions (Signed)
I value your feedback and you entrusting us with your care. If you get a [facility] patient survey, I would appreciate you taking the time to let us know about your experience today. Thank you! ? ? ?

## 2022-11-25 ENCOUNTER — Encounter: Payer: Self-pay | Admitting: Physician Assistant

## 2022-11-26 ENCOUNTER — Encounter: Payer: Self-pay | Admitting: Internal Medicine

## 2022-11-26 ENCOUNTER — Encounter: Payer: Self-pay | Admitting: Pulmonary Disease

## 2022-12-02 ENCOUNTER — Ambulatory Visit: Payer: Medicare Other | Admitting: Dietician

## 2022-12-04 ENCOUNTER — Other Ambulatory Visit: Payer: Self-pay

## 2022-12-04 DIAGNOSIS — M5442 Lumbago with sciatica, left side: Secondary | ICD-10-CM

## 2022-12-04 DIAGNOSIS — M5137 Other intervertebral disc degeneration, lumbosacral region: Secondary | ICD-10-CM

## 2022-12-04 MED ORDER — METHOCARBAMOL 750 MG PO TABS: 750.0000 mg | ORAL_TABLET | Freq: Two times a day (BID) | ORAL | 0 refills | Status: DC | PRN

## 2022-12-04 NOTE — Telephone Encounter (Signed)
LOV 10/15/22 NOV none LRF 03/14/21 120 x 0

## 2022-12-09 NOTE — Progress Notes (Unsigned)
[name], PA-C   No chief complaint on file.   HPI:      Ms. Natalie Eaton is a 67 y.o. [phone] whose LMP was No LMP recorded. 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   Ventral hernia without obstruction or gangrene 10/15/2022   Flank pain 10/07/2022   CLL (chronic lymphocytic leukemia) ([place]) 10/03/2022   Leukocytosis 09/20/2022   Epigastric pain 09/20/2022   Chronic back pain 09/20/2022   Prediabetes 07/11/2022   Arthritis 06/10/2019   MVP (mitral valve prolapse) 06/10/2019   Statin intolerance 02/17/2019   Pure hypercholesterolemia 11/16/2018   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   Obesity (BMI 30-39.9) 02/24/2018   Essential hypertension, benign 11/21/2017   Hypothyroidism 11/21/2017    Past Surgical History:  Procedure Laterality Date   ABDOMINAL HYSTERECTOMY     BLADDER SURGERY     CARDIAC CATHETERIZATION     GALLBLADDER SURGERY     LUNG REMOVAL, PARTIAL     ROTATOR CUFF REPAIR     TONSILLECTOMY     TUBAL LIGATION      Family History  Problem Relation Age of Onset   Healthy Mother    Scoliosis Mother    Heart disease Father    Heart disease Brother    Healthy Daughter    Healthy Son     Social History   Socioeconomic History   Marital status: Widowed    Spouse name: Not on file   Number of children: 2   Years of education: Not on file   Highest education level: Not on file  Occupational History   Not on file  Tobacco Use   Smoking status: Former    Packs/day: 0.25    Years: 15.00    Additional pack years: 0.00    Total pack years: 3.75    Types: Cigarettes    Quit date: 52    Years since quitting: 28.2   Smokeless tobacco: Never  Vaping Use   Vaping Use: Never used  Substance and Sexual Activity   Alcohol  use: Yes    Alcohol/week: 1.0 standard drink of alcohol    Types: 1 Glasses of wine per week   Drug use: No   Sexual activity: Not Currently    Birth control/protection: None  Other Topics Concern   Not on file  Social History Narrative   Not on file   Social Determinants of Health   Financial Resource Strain: Not on file  Food Insecurity: Not on file  Transportation Needs: Not on file  Physical Activity: Not on file  Stress: Not on file  Social Connections: Not on file  Intimate Partner Violence: Not on file    Outpatient Medications Prior to Visit  Medication Sig Dispense Refill   aspirin 81 MG chewable tablet Chew 81 mg by mouth daily.     celecoxib (CELEBREX) 200 MG capsule Take 1 capsule (200 mg total) by mouth 2 (two) times daily. 180 capsule 1   gabapentin (NEURONTIN) 300 MG capsule Take 1 capsule (300 mg total) by mouth 3 (three) times daily. 270 capsule 1   hydrochlorothiazide (HYDRODIURIL) 25 MG tablet Take 1 tablet (25 mg total) by mouth daily. 90 tablet 3   levothyroxine (SYNTHROID) 88 MCG tablet Take 1 tablet (88 mcg total)  by mouth daily. 90 tablet 3   methocarbamol (ROBAXIN) 750 MG tablet Take 1 tablet (750 mg total) by mouth 2 (two) times daily as needed for muscle spasms. 30 tablet 0   Multiple Vitamin (MULTIVITAMIN ADULT PO) Take 1 each by mouth daily at 2 PM.     pantoprazole (PROTONIX) 40 MG tablet Take 1 tablet (40 mg total) by mouth daily. 90 tablet 1   potassium chloride (MICRO-K) 10 MEQ CR capsule Take 1 capsule (10 mEq total) by mouth 2 (two) times daily. 180 capsule 1   rosuvastatin (CRESTOR) 10 MG tablet Take 1 tablet (10 mg total) by mouth daily. 90 tablet 1   traMADol (ULTRAM) 50 MG tablet Take 50 mg by mouth 3 (three) times daily.     traZODone (DESYREL) 100 MG tablet Take 100 mg by mouth at bedtime.     valACYclovir (VALTREX) 500 MG tablet Take 500 mg by mouth daily. As needed  4   No facility-administered medications prior to visit.       ROS:  Review of Systems BREAST: No symptoms   OBJECTIVE:   Vitals:  There were no vitals taken for this visit.  Physical Exam  Results: No results found for this or any previous visit (from the past 24 hour(s)).   Assessment/Plan: No diagnosis found.    No orders of the defined types were placed in this encounter.     No follow-ups on file.  Tyresa Prindiville B. [place], PA-C 12/09/2022 7:59 PM

## 2022-12-10 ENCOUNTER — Encounter: Payer: Self-pay | Admitting: Obstetrics and Gynecology

## 2022-12-10 ENCOUNTER — Ambulatory Visit (INDEPENDENT_AMBULATORY_CARE_PROVIDER_SITE_OTHER): Payer: Medicare Other | Admitting: Obstetrics and Gynecology

## 2022-12-10 VITALS — BP 100/70 | Ht 67.0 in | Wt 189.0 lb

## 2022-12-10 DIAGNOSIS — A6004 Herpesviral vulvovaginitis: Secondary | ICD-10-CM | POA: Diagnosis not present

## 2022-12-10 NOTE — Patient Instructions (Signed)
I value your feedback and you entrusting us with your care. If you get a [facility] patient survey, I would appreciate you taking the time to let us know about your experience today. Thank you! ? ? ?

## 2022-12-31 ENCOUNTER — Other Ambulatory Visit: Payer: Self-pay | Admitting: Nurse Practitioner

## 2022-12-31 DIAGNOSIS — M1990 Unspecified osteoarthritis, unspecified site: Secondary | ICD-10-CM

## 2023-01-02 ENCOUNTER — Inpatient Hospital Stay (HOSPITAL_BASED_OUTPATIENT_CLINIC_OR_DEPARTMENT_OTHER): Payer: Medicare Other | Admitting: Internal Medicine

## 2023-01-02 ENCOUNTER — Encounter: Payer: Self-pay | Admitting: Internal Medicine

## 2023-01-02 ENCOUNTER — Encounter: Payer: Self-pay | Admitting: Physician Assistant

## 2023-01-02 ENCOUNTER — Other Ambulatory Visit: Payer: Self-pay | Admitting: Physician Assistant

## 2023-01-02 ENCOUNTER — Inpatient Hospital Stay: Payer: Medicare Other | Attending: Internal Medicine

## 2023-01-02 VITALS — BP 132/79 | HR 69 | Temp 96.6°F | Resp 20 | Wt 191.0 lb

## 2023-01-02 DIAGNOSIS — I10 Essential (primary) hypertension: Secondary | ICD-10-CM | POA: Diagnosis not present

## 2023-01-02 DIAGNOSIS — R5383 Other fatigue: Secondary | ICD-10-CM | POA: Insufficient documentation

## 2023-01-02 DIAGNOSIS — J984 Other disorders of lung: Secondary | ICD-10-CM | POA: Diagnosis not present

## 2023-01-02 DIAGNOSIS — M25472 Effusion, left ankle: Secondary | ICD-10-CM | POA: Insufficient documentation

## 2023-01-02 DIAGNOSIS — R2232 Localized swelling, mass and lump, left upper limb: Secondary | ICD-10-CM | POA: Diagnosis not present

## 2023-01-02 DIAGNOSIS — K449 Diaphragmatic hernia without obstruction or gangrene: Secondary | ICD-10-CM | POA: Insufficient documentation

## 2023-01-02 DIAGNOSIS — Z79899 Other long term (current) drug therapy: Secondary | ICD-10-CM | POA: Insufficient documentation

## 2023-01-02 DIAGNOSIS — Z90710 Acquired absence of both cervix and uterus: Secondary | ICD-10-CM | POA: Diagnosis not present

## 2023-01-02 DIAGNOSIS — R160 Hepatomegaly, not elsewhere classified: Secondary | ICD-10-CM | POA: Insufficient documentation

## 2023-01-02 DIAGNOSIS — M1990 Unspecified osteoarthritis, unspecified site: Secondary | ICD-10-CM

## 2023-01-02 DIAGNOSIS — I2510 Atherosclerotic heart disease of native coronary artery without angina pectoris: Secondary | ICD-10-CM | POA: Diagnosis not present

## 2023-01-02 DIAGNOSIS — R61 Generalized hyperhidrosis: Secondary | ICD-10-CM | POA: Diagnosis not present

## 2023-01-02 DIAGNOSIS — E039 Hypothyroidism, unspecified: Secondary | ICD-10-CM | POA: Insufficient documentation

## 2023-01-02 DIAGNOSIS — K439 Ventral hernia without obstruction or gangrene: Secondary | ICD-10-CM | POA: Diagnosis not present

## 2023-01-02 DIAGNOSIS — Z87820 Personal history of traumatic brain injury: Secondary | ICD-10-CM | POA: Diagnosis not present

## 2023-01-02 DIAGNOSIS — Z87891 Personal history of nicotine dependence: Secondary | ICD-10-CM | POA: Insufficient documentation

## 2023-01-02 DIAGNOSIS — C9110 Chronic lymphocytic leukemia of B-cell type not having achieved remission: Secondary | ICD-10-CM | POA: Insufficient documentation

## 2023-01-02 DIAGNOSIS — Z8249 Family history of ischemic heart disease and other diseases of the circulatory system: Secondary | ICD-10-CM | POA: Diagnosis not present

## 2023-01-02 DIAGNOSIS — R918 Other nonspecific abnormal finding of lung field: Secondary | ICD-10-CM | POA: Diagnosis not present

## 2023-01-02 DIAGNOSIS — Z885 Allergy status to narcotic agent status: Secondary | ICD-10-CM | POA: Insufficient documentation

## 2023-01-02 DIAGNOSIS — R232 Flushing: Secondary | ICD-10-CM | POA: Insufficient documentation

## 2023-01-02 DIAGNOSIS — R42 Dizziness and giddiness: Secondary | ICD-10-CM | POA: Diagnosis not present

## 2023-01-02 LAB — CBC WITH DIFFERENTIAL/PLATELET
Abs Immature Granulocytes: 0.04 10*3/uL (ref 0.00–0.07)
Basophils Absolute: 0.1 10*3/uL (ref 0.0–0.1)
Basophils Relative: 1 %
Eosinophils Absolute: 0.3 10*3/uL (ref 0.0–0.5)
Eosinophils Relative: 3 %
HCT: 40 % (ref 36.0–46.0)
Hemoglobin: 12.9 g/dL (ref 12.0–15.0)
Immature Granulocytes: 0 %
Lymphocytes Relative: 52 %
Lymphs Abs: 5.8 10*3/uL — ABNORMAL HIGH (ref 0.7–4.0)
MCH: 30.5 pg (ref 26.0–34.0)
MCHC: 32.3 g/dL (ref 30.0–36.0)
MCV: 94.6 fL (ref 80.0–100.0)
Monocytes Absolute: 0.5 10*3/uL (ref 0.1–1.0)
Monocytes Relative: 5 %
Neutro Abs: 4.4 10*3/uL (ref 1.7–7.7)
Neutrophils Relative %: 39 %
Platelets: 205 10*3/uL (ref 150–400)
RBC: 4.23 MIL/uL (ref 3.87–5.11)
RDW: 13.5 % (ref 11.5–15.5)
WBC: 11.1 10*3/uL — ABNORMAL HIGH (ref 4.0–10.5)
nRBC: 0 % (ref 0.0–0.2)

## 2023-01-02 LAB — COMPREHENSIVE METABOLIC PANEL
ALT: 20 U/L (ref 0–44)
AST: 18 U/L (ref 15–41)
Albumin: 4.3 g/dL (ref 3.5–5.0)
Alkaline Phosphatase: 53 U/L (ref 38–126)
Anion gap: 8 (ref 5–15)
BUN: 26 mg/dL — ABNORMAL HIGH (ref 8–23)
CO2: 26 mmol/L (ref 22–32)
Calcium: 9.3 mg/dL (ref 8.9–10.3)
Chloride: 104 mmol/L (ref 98–111)
Creatinine, Ser: 0.73 mg/dL (ref 0.44–1.00)
GFR, Estimated: >60 mL/min (ref >60)
Glucose, Bld: 98 mg/dL (ref 70–99)
Potassium: 4.2 mmol/L (ref 3.5–5.1)
Sodium: 138 mmol/L (ref 135–145)
Total Bilirubin: 0.3 mg/dL (ref 0.3–1.2)
Total Protein: 6.8 g/dL (ref 6.5–8.1)

## 2023-01-02 LAB — LACTATE DEHYDROGENASE: LDH: 102 U/L (ref 98–192)

## 2023-01-02 NOTE — Progress Notes (Signed)
Patient states she is having more fatigue. Patient states iron pills make her sick to her stomach. Patient has noticed she is started have some leg and feet swelling and doesn't feel like the Fluid pill is working.

## 2023-01-06 NOTE — Progress Notes (Signed)
[facility] CONSULT NOTE  Patient Care Team: Burnett Corrente as PCP - General (Physician Assistant)   CANCER STAGING   Cancer Staging  CLL (chronic lymphocytic leukemia) Staging form: Chronic Lymphocytic Leukemia / Small Lymphocytic Lymphoma, AJCC 8th Edition - Clinical stage from 10/03/2022: Modified Rai Stage 0 (Modified Rai risk: Low, Lymphocytosis: Present, Adenopathy: Absent, Organomegaly: Absent, Anemia: Absent, Thrombocytopenia: Absent) - Signed by Michaelyn Barter, MD on 10/03/2022 Stage prefix: Initial diagnosis   ASSESSMENT & PLAN:  Natalie Eaton 67 y.o. female with pmh of CAD, prediabetes, hypothyroidism, hypertension, hysterectomy, TBI was referred to hematology for workup of lymphocytosis.  # CLL, Rai stage 0 -Flow cytometry done on 09/27/2022 showed involvement by CD5, CD23, CD20, CD22 positive clonal B-cell population.  She has peripheral lymphocytosis more than 5000 for more than 3 months.  Diagnostic for CLL.  -Baseline CT chest abdomen pelvis done on 10/08/2022 did not show any adenopathy or splenomegaly.  Hepatomegaly present.  Right upper lobe pneumatocele seen.  Small pulmonary nodules measuring up to 5 mm favoring infectious or inflammatory.  Small hiatal hernia.  Infraumbilical ventral hernia.  -She is on surveillance for CLL.  CBC with differential showed stable WBC of 11.1 with ALC 5.8, hemoglobin 12.9 and platelets 205.  Patient has chronic fatigue which she feels is worse now.  She also has chronic hot flashes and night sweats.  Her symptoms may or may not be related to CLL.  Her blood work is stable and there was no adenopathy on CT imaging.  I do not see indication for treatment for CLL.  Will continue with surveillance every 6 months with H&P and labs.  IGHV hypersomatic mutation pending (for prognostication).  #Lung pneumatocele -Follows with Dr. Jayme Cloud.  Plan for repeat CT in 3 months scheduled in May.  PFTs.  # Left ankle  swelling -Denies any calf pain or neck swelling.  Worsens with prolonged standing.  Discussed about leg elevation.  # Arthritis -Uses left knee brace.  Follows with orthopedic.  Orders Placed This Encounter  Procedures   CBC with Differential/Platelet    Standing Status:   Future    Standing Expiration Date:   01/06/2024   Comprehensive metabolic panel    Standing Status:   Future    Standing Expiration Date:   01/06/2024   Lactate dehydrogenase    Standing Status:   Future    Standing Expiration Date:   01/06/2024   RTC in 6 months for MD visit, labs  The total time spent in the appointment was 30 minutes encounter with patients including review of chart and various tests results, discussions about plan of care and coordination of care plan   All questions were answered. The patient knows to call the clinic with any problems, questions or concerns. No barriers to learning was detected.  Michaelyn Barter, MD 4/22/20249:05 AM   HISTORY OF PRESENTING ILLNESS:  Natalie Eaton 67 y.o. female with pmh of CAD, prediabetes, hypothyroidism, hypertension, hysterectomy, TBI was referred to hematology for workup of lymphocytosis.   Patient reports hot flashes during the day and at night for about 1 year.  During the day, when she has an episode of hot flashes she feels lightheaded at the same time.  She has hair loss.  Denies any dizziness or blurry vision.  Denies any weight loss, decreased appetite, fever, chills, lumps.     Labs reviewed. Patient has elevated WBC since 2019.  Predominantly lymphocytosis more than 5000.  Hemoglobin and platelets  are normal  Interval history Patient was seen today as 90-month follow-up for H&P and labs for surveillance of CLL. She has chronic fatigue but feels her fatigue is worse.  She also has hot flashes which are chronic but things have been worse.  Reports left ankle swelling but denies any leg swelling or calf pain.  She noticed a small nodule in her left  thumb.  Not restricting the range of movement or affecting her activity.  She has follow-up with her orthopedics in couple of months.  We discussed that my suspicion for it to be related to CLL is very low.  I have reviewed her chart and materials related to her cancer extensively and collaborated history with the patient. Summary of oncologic history is as follows: Oncology History  CLL (chronic lymphocytic leukemia)  10/03/2022 Initial Diagnosis   CLL (chronic lymphocytic leukemia) ([facility])   10/03/2022 Cancer Staging   Staging form: Chronic Lymphocytic Leukemia / Small Lymphocytic Lymphoma, AJCC 8th Edition - Clinical stage from 10/03/2022: Modified Rai Stage 0 (Modified Rai risk: Low, Lymphocytosis: Present, Adenopathy: Absent, Organomegaly: Absent, Anemia: Absent, Thrombocytopenia: Absent) - Signed by Michaelyn Barter, MD on 10/03/2022 Stage prefix: Initial diagnosis     MEDICAL HISTORY:  Past Medical History:  Diagnosis Date   Arthritis    Back pain    Closed TBI (traumatic brain injury)    Coronary artery disease    Epilepsy    Heart murmur    History of hysterectomy    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, per gyn   Hypertension    Hypothyroidism    Insomnia    Leukemia    MVP (mitral valve prolapse)    Pneumonia    Pre-diabetes     SURGICAL HISTORY: Past Surgical History:  Procedure Laterality Date   ABDOMINAL HYSTERECTOMY     BLADDER SURGERY     CARDIAC CATHETERIZATION     GALLBLADDER SURGERY     LUNG REMOVAL, PARTIAL     ROTATOR CUFF REPAIR     TONSILLECTOMY     TUBAL LIGATION      SOCIAL HISTORY: Social History   Socioeconomic History   Marital status: Widowed    Spouse name: Not on file   Number of children: 2   Years of education: Not on file   Highest education level: Not on file  Occupational History   Not on file  Tobacco Use   Smoking status: Former    Packs/day: 0.25    Years: 15.00    Additional pack years: 0.00     Total pack years: 3.75    Types: Cigarettes    Quit date: 66    Years since quitting: 28.3   Smokeless tobacco: Never  Vaping Use   Vaping Use: Never used  Substance and Sexual Activity   Alcohol use: Yes    Alcohol/week: 1.0 standard drink of alcohol    Types: 1 Glasses of wine per week   Drug use: No   Sexual activity: Not Currently    Birth control/protection: None  Other Topics Concern   Not on file  Social History Narrative   Not on file   Social Determinants of Health   Financial Resource Strain: Not on file  Food Insecurity: Not on file  Transportation Needs: Not on file  Physical Activity: Not on file  Stress: Not on file  Social Connections: Not on file  Intimate Partner Violence: Not on file    FAMILY  HISTORY: Family History  Problem Relation Age of Onset   Healthy Mother    Scoliosis Mother    Heart disease Father    Heart disease Brother    Healthy Daughter    Healthy Son     ALLERGIES:  is allergic to codeine.  MEDICATIONS:  Current Outpatient Medications  Medication Sig Dispense Refill   aspirin 81 MG chewable tablet Chew 81 mg by mouth daily.     celecoxib (CELEBREX) 200 MG capsule TAKE 1 CAPSULE BY MOUTH TWICE A DAY 120 capsule 0   gabapentin (NEURONTIN) 300 MG capsule Take 1 capsule (300 mg total) by mouth 3 (three) times daily. 270 capsule 1   hydrochlorothiazide (HYDRODIURIL) 25 MG tablet Take 1 tablet (25 mg total) by mouth daily. 90 tablet 3   levothyroxine (SYNTHROID) 88 MCG tablet Take 1 tablet (88 mcg total) by mouth daily. 90 tablet 3   methocarbamol (ROBAXIN) 750 MG tablet Take 1 tablet (750 mg total) by mouth 2 (two) times daily as needed for muscle spasms. 30 tablet 0   Multiple Vitamin (MULTIVITAMIN ADULT PO) Take 1 each by mouth daily at 2 PM.     pantoprazole (PROTONIX) 40 MG tablet Take 1 tablet (40 mg total) by mouth daily. 90 tablet 1   potassium chloride (MICRO-K) 10 MEQ CR capsule Take 1 capsule (10 mEq total) by mouth 2  (two) times daily. 180 capsule 1   rosuvastatin (CRESTOR) 10 MG tablet Take 1 tablet (10 mg total) by mouth daily. 90 tablet 1   traMADol (ULTRAM) 50 MG tablet Take 50 mg by mouth 3 (three) times daily.     traZODone (DESYREL) 100 MG tablet Take 100 mg by mouth at bedtime.     valACYclovir (VALTREX) 500 MG tablet Take 500 mg by mouth daily. As needed  4   No current facility-administered medications for this visit.    REVIEW OF SYSTEMS:   Pertinent information mentioned in HPI All other systems were reviewed with the patient and are negative.  PHYSICAL EXAMINATION: ECOG PERFORMANCE STATUS: 1 - Symptomatic but completely ambulatory  Vitals:   01/02/23 1051  BP: 132/79  Pulse: 69  Resp: 20  Temp: (!) 96.6 F (35.9 C)  SpO2: 100%   Filed Weights   01/02/23 1051  Weight: 191 lb (86.6 kg)    GENERAL:alert, no distress and comfortable SKIN: skin color, texture, turgor are normal, no rashes or significant lesions EYES: normal, conjunctiva are pink and non-injected, sclera clear OROPHARYNX:no exudate, no erythema and lips, buccal mucosa, and tongue normal  NECK: supple, thyroid normal size, non-tender, without nodularity LYMPH:  no palpable lymphadenopathy in the cervical, axillary or inguinal LUNGS: clear to auscultation and percussion with normal breathing effort HEART: regular rate & rhythm and no murmurs and no lower extremity edema ABDOMEN:abdomen soft, non-tender and normal bowel sounds Musculoskeletal:no cyanosis of digits and no clubbing  PSYCH: alert & oriented x 3 with fluent speech NEURO: no focal motor/sensory deficits  LABORATORY DATA:  I have reviewed the data as listed Lab Results  Component Value Date   WBC 11.1 (H) 01/02/2023   HGB 12.9 01/02/2023   HCT 40.0 01/02/2023   MCV 94.6 01/02/2023   PLT 205 01/02/2023   Recent Labs    07/04/22 1043 10/08/22 1125 01/02/23 1040  NA 142  --  138  K 4.6  --  4.2  CL 105  --  104  CO2 21  --  26  GLUCOSE 84   --  98  BUN 22  --  26*  CREATININE 0.77 0.90 0.73  CALCIUM 9.8  --  9.3  GFRNONAA  --   --  >60  PROT 6.7  --  6.8  ALBUMIN  --   --  4.3  AST 15  --  18  ALT 16  --  20  ALKPHOS  --   --  53  BILITOT 0.4  --  0.3    RADIOGRAPHIC STUDIES: I have personally reviewed the radiological images as listed and agreed with the findings in the report. No results found.

## 2023-01-07 ENCOUNTER — Encounter: Payer: Self-pay | Admitting: Physician Assistant

## 2023-01-07 ENCOUNTER — Other Ambulatory Visit: Payer: Self-pay | Admitting: Physician Assistant

## 2023-01-07 DIAGNOSIS — E669 Obesity, unspecified: Secondary | ICD-10-CM

## 2023-01-07 DIAGNOSIS — I10 Essential (primary) hypertension: Secondary | ICD-10-CM

## 2023-01-13 LAB — IGVH SOMATIC HYPERMUTATION

## 2023-01-20 ENCOUNTER — Ambulatory Visit
Admission: RE | Admit: 2023-01-20 | Discharge: 2023-01-20 | Disposition: A | Discharge status: Home / Self Care | Payer: Medicare Other | Source: Ambulatory Visit | Attending: Pulmonary Disease | Admitting: Pulmonary Disease

## 2023-01-20 DIAGNOSIS — K449 Diaphragmatic hernia without obstruction or gangrene: Secondary | ICD-10-CM | POA: Diagnosis not present

## 2023-01-20 DIAGNOSIS — R0600 Dyspnea, unspecified: Secondary | ICD-10-CM | POA: Insufficient documentation

## 2023-01-20 DIAGNOSIS — C9110 Chronic lymphocytic leukemia of B-cell type not having achieved remission: Secondary | ICD-10-CM | POA: Insufficient documentation

## 2023-01-20 DIAGNOSIS — J984 Other disorders of lung: Secondary | ICD-10-CM | POA: Insufficient documentation

## 2023-01-20 DIAGNOSIS — R911 Solitary pulmonary nodule: Secondary | ICD-10-CM | POA: Insufficient documentation

## 2023-01-20 DIAGNOSIS — I2510 Atherosclerotic heart disease of native coronary artery without angina pectoris: Secondary | ICD-10-CM | POA: Insufficient documentation

## 2023-01-20 DIAGNOSIS — J9811 Atelectasis: Secondary | ICD-10-CM | POA: Insufficient documentation

## 2023-01-20 DIAGNOSIS — Z87891 Personal history of nicotine dependence: Secondary | ICD-10-CM | POA: Diagnosis not present

## 2023-01-23 ENCOUNTER — Other Ambulatory Visit: Payer: Self-pay

## 2023-01-23 DIAGNOSIS — R0602 Shortness of breath: Secondary | ICD-10-CM

## 2023-02-06 ENCOUNTER — Ambulatory Visit: Payer: Medicare Other | Attending: Pulmonary Disease

## 2023-02-06 DIAGNOSIS — Z87891 Personal history of nicotine dependence: Secondary | ICD-10-CM | POA: Insufficient documentation

## 2023-02-06 DIAGNOSIS — R0602 Shortness of breath: Secondary | ICD-10-CM | POA: Insufficient documentation

## 2023-02-06 DIAGNOSIS — R0600 Dyspnea, unspecified: Secondary | ICD-10-CM | POA: Diagnosis present

## 2023-02-06 LAB — PULMONARY FUNCTION TEST ARMC ONLY
DL/VA % pred: 98 %
DL/VA: 4 ml/min/mmHg/L
DLCO unc % pred: 86 %
DLCO unc: 18.9 ml/min/mmHg
FEF 25-75 Post: 1.87 L/sec
FEF 25-75 Pre: 1.81 L/sec
FEF2575-%Change-Post: 3 %
FEF2575-%Pred-Post: 84 %
FEF2575-%Pred-Pre: 81 %
FEV1-%Change-Post: 0 %
FEV1-%Pred-Post: 79 %
FEV1-%Pred-Pre: 78 %
FEV1-Post: 2.11 L
FEV1-Pre: 2.09 L
FEV1FVC-%Change-Post: 5 %
FEV1FVC-%Pred-Pre: 100 %
FEV6-%Change-Post: -4 %
FEV6-%Pred-Post: 77 %
FEV6-%Pred-Pre: 81 %
FEV6-Post: 2.59 L
FEV6-Pre: 2.71 L
FEV6FVC-%Pred-Post: 104 %
FEV6FVC-%Pred-Pre: 104 %
FVC-%Change-Post: -4 %
FVC-%Pred-Post: 74 %
FVC-%Pred-Pre: 77 %
FVC-Post: 2.59 L
FVC-Pre: 2.71 L
Post FEV1/FVC ratio: 81 %
Post FEV6/FVC ratio: 100 %
Pre FEV1/FVC ratio: 77 %
Pre FEV6/FVC Ratio: 100 %
RV % pred: 140 %
RV: 3.2 L
TLC % pred: 107 %
TLC: 5.94 L

## 2023-02-06 MED ORDER — ALBUTEROL SULFATE (2.5 MG/3ML) 0.083% IN NEBU
2.5000 mg | INHALATION_SOLUTION | Freq: Once | RESPIRATORY_TRACT | Status: AC
  Administered 2023-02-06: 2.5 mg via RESPIRATORY_TRACT
  Filled 2023-02-06: qty 3

## 2023-02-12 ENCOUNTER — Other Ambulatory Visit: Payer: Self-pay | Admitting: Obstetrics and Gynecology

## 2023-02-12 ENCOUNTER — Encounter: Payer: Self-pay | Admitting: Obstetrics and Gynecology

## 2023-02-12 MED ORDER — VALACYCLOVIR HCL 500 MG PO TABS: 500.0000 mg | ORAL_TABLET | Freq: Every day | ORAL | 2 refills | Status: DC

## 2023-02-17 ENCOUNTER — Ambulatory Visit (INDEPENDENT_AMBULATORY_CARE_PROVIDER_SITE_OTHER): Payer: Medicare Other | Admitting: Primary Care

## 2023-02-17 ENCOUNTER — Encounter: Payer: Self-pay | Admitting: Primary Care

## 2023-02-17 VITALS — BP 122/78 | HR 79 | Temp 98.2°F | Ht 67.0 in | Wt 186.2 lb

## 2023-02-17 DIAGNOSIS — J449 Chronic obstructive pulmonary disease, unspecified: Secondary | ICD-10-CM

## 2023-02-17 DIAGNOSIS — J984 Other disorders of lung: Secondary | ICD-10-CM | POA: Diagnosis not present

## 2023-02-17 DIAGNOSIS — R058 Other specified cough: Secondary | ICD-10-CM

## 2023-02-17 MED ORDER — ALBUTEROL SULFATE HFA 108 (90 BASE) MCG/ACT IN AERS: 2.0000 | INHALATION_SPRAY | Freq: Four times a day (QID) | RESPIRATORY_TRACT | 0 refills | Status: DC | PRN

## 2023-02-17 NOTE — Assessment & Plan Note (Signed)
-   Patient has an occasional dry cough without sputum production. No associated shortness of breath or wheezing. PND and reflux possible contributors. Recommend trial OTC loratadine 10mg  daily and pepcid 20mg  at bedtime. Follow-up 6 months or sooner if needed.

## 2023-02-17 NOTE — Assessment & Plan Note (Addendum)
-   Former smoker. Pulmonary function testing in May 2024 showed minimal obstructive airway disease, asthmatic type.  Patient is largely asymptomatic without daily respiratory symptoms.  I do not recommend starting maintenance inhaler unless symptoms worsen. Will send in Rx for albuterol to use 2 puffs every 4-6 hours as needed for breakthrough shortness of breath or wheezing.  Advised patient notify office if needing to use [place] daily.

## 2023-02-17 NOTE — Assessment & Plan Note (Signed)
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 CT imaging in may 2024 showed right upper lobe scarring/architectural distortion with central thin walled cavitary lesion measuring 4.7 x 3.5cm, grossly unchanged. No follow-up needed.

## 2023-02-17 NOTE — Progress Notes (Signed)
@Patient  ID: Natalie Eaton, female    DOB: November 28, 1955, 67 y.o.   MRN: [id number]  Chief Complaint  Patient presents with   Follow-up    No SOB or wheezing. Dry cough.     Referring provider: Alfredia Ferguson, PA-C  HPI: 67 year old female, former smoker. PMH hypertension, pneumatocele of lung, hepatomegaly, hepatic steatosis, hypothyroidism, CLL.   Previous [facility] pulmonary encounter: 10/22/22 Patient is a 67 year old remote former smoker with minimal tobacco exposure who presents for evaluation of an abnormal chest x-ray.  She is kindly referred by Alfredia Ferguson, PA-C.  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  She was noted to have lymphocytosis.  Part of her workup included a chest abdomen and pelvis with contrast.  The right upper lobe findings on CT showed a pneumatocele extending from the hilum to the lateral and apical pleura.  There were some small pulmonary nodules associated with this area measuring approximately 5 mm.  Findings are favored to reflect sequela of prior infection/inflammation.  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  She has previously been evaluated for suspected COPD in 2022 by Dr. Chilton Greathouse.  She did not follow through with PFTs at that time.  She was tried on Anoro but she did not like how she felt with the medication and discontinued it.  She states that she felt worse on the medication.  At that time chest x-ray did show what appeared to be bullous disease.  She has not had any recent fevers, chills or sweats no cough or sputum production.  She has occasional dyspnea with heavy exertion but not limiting to her.  She attributes this to deconditioning.  Does not endorse any other symptomatology.  As noted she has been recently diagnosed with CLL.   02/17/2023- Interim hx  Patient presents today for follow-up pneumatocele. Hx  cystic lung disease at a young age, she had a partial right upper lung lobectomy at age 83.   She is doing well today. She has no acute respiratory symptoms except for an occasional dry cough. She tells me she used to be more short of breath than she is now. Breathing is not affecting her day to day function. No overt PND or sinus congestion. She has some mild reflux symptoms. Pulmonary function testing in May 2024 showed minimal obstructive airway disease, asthmatic type. Repeat CT chest imaging showed no new findings, stable scarring and nodularity RUL. Denies shortness of breath, wheezing or sputum production.   Imaging: CT chest 01/20/23>> Right upper lobe scarring/architectural distortion with central thin-walled cavitary lesion extending to the hilum, measuring 4.7 x 3.5 cm, grossly unchanged. No further follow up needed.   Allergies  Allergen Reactions   Codeine Other (See Comments)    Tingling on her scalp.    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     celecoxib (CELEBREX) 200 MG capsule TAKE 1 CAPSULE BY MOUTH TWICE A DAY 120 capsule 0   hydrochlorothiazide (HYDRODIURIL) 25 MG tablet Take 1 tablet (25 mg total) by mouth daily. 90 tablet 3   levothyroxine (SYNTHROID) 88 MCG tablet Take 1 tablet (88 mcg total) by mouth daily. 90 tablet 3   methocarbamol (ROBAXIN) 750 MG tablet Take 1 tablet (750 mg total) by mouth 2 (two) times daily as needed for muscle spasms. 30 tablet 0   Multiple Vitamin (MULTIVITAMIN ADULT PO) Take 1 each by mouth daily at 2 PM.     pantoprazole (PROTONIX) 40 MG tablet Take 1 tablet (40 mg total) by mouth daily. 90 tablet 1   potassium chloride (MICRO-K) 10 MEQ CR capsule Take 1 capsule (10 mEq total) by mouth 2 (two) times daily. 180 capsule 1   rosuvastatin (CRESTOR) 10 MG tablet Take 1 tablet (10 mg total) by mouth daily. 90 tablet 1   traMADol (ULTRAM) 50 MG tablet Take 50 mg by mouth 3 (three) times daily.     traZODone (DESYREL) 100 MG tablet Take 100 mg by mouth at bedtime.     valACYclovir (VALTREX) 500 MG tablet Take 1 tablet (500 mg total) by mouth daily. Or BID for 3 days prn sx 90 tablet 2   gabapentin (NEURONTIN) 300 MG capsule Take 1 capsule (300 mg total) by mouth 3 (three) times daily. 270 capsule 1   No facility-administered medications prior to visit.    Review of Systems  Review of Systems  Constitutional: Negative.   HENT: Negative.    Respiratory:  Negative for cough, chest tightness, shortness of breath and wheezing.    Physical Exam  BP 122/78 (BP Location: Left Arm, Cuff Size: Normal)   Pulse 79   Temp 98.2 F (36.8 C)   Ht 5\' 7"  (1.702 m)   Wt 186 lb 3.2 oz (84.5 kg)   SpO2 97%   BMI 29.16 kg/m  Physical Exam Constitutional:      Appearance:  Normal appearance.  HENT:     Head: Normocephalic and atraumatic.     Mouth/Throat:     Mouth: Mucous membranes are moist.     Pharynx: Oropharynx is clear.  Cardiovascular:     Rate and Rhythm: Normal rate and regular rhythm.  Pulmonary:     Effort: Pulmonary effort is normal.     Breath sounds: Normal breath sounds. No wheezing, rhonchi or rales.  Musculoskeletal:        General: Normal range of motion.  Skin:    General: Skin is warm and dry.  Neurological:     General: No focal deficit present.     Mental Status: She is alert and oriented to  person, place, and time. Mental status is at baseline.  Psychiatric:        Mood and Affect: Mood normal.        Behavior: Behavior normal.        Thought Content: Thought content normal.        Judgment: Judgment normal.      Lab Results:  CBC    Component Value Date/Time   WBC 11.1 (H) 01/02/2023 1040   RBC 4.23 01/02/2023 1040   HGB 12.9 01/02/2023 1040   HGB 12.8 09/23/2022 1026   HCT 40.0 01/02/2023 1040   HCT 39.2 09/23/2022 1026   PLT 205 01/02/2023 1040   PLT 237 09/23/2022 1026   MCV 94.6 01/02/2023 1040   MCV 94 09/23/2022 1026   MCH 30.5 01/02/2023 1040   MCHC 32.3 01/02/2023 1040   RDW 13.5 01/02/2023 1040   RDW 12.1 09/23/2022 1026   LYMPHSABS 5.8 (H) 01/02/2023 1040   LYMPHSABS 5.6 (H) 09/23/2022 1026   MONOABS 0.5 01/02/2023 1040   EOSABS 0.3 01/02/2023 1040   EOSABS 0.2 09/23/2022 1026   BASOSABS 0.1 01/02/2023 1040   BASOSABS 0.1 09/23/2022 1026    BMET    Component Value Date/Time   NA 138 01/02/2023 1040   NA 137 09/01/2020 1050   K 4.2 01/02/2023 1040   CL 104 01/02/2023 1040   CO2 26 01/02/2023 1040   GLUCOSE 98 01/02/2023 1040   BUN 26 (H) 01/02/2023 1040   BUN 21 09/01/2020 1050   CREATININE 0.73 01/02/2023 1040   CREATININE 0.77 07/04/2022 1043   CALCIUM 9.3 01/02/2023 1040   GFRNONAA >60 01/02/2023 1040   GFRAA 105 09/01/2020 1050    BNP No results found for: "BNP"  ProBNP     Component Value Date/Time   PROBNP 59 09/01/2020 1050    Imaging: CT Chest Wo Contrast  Result Date: 01/23/2023 CLINICAL DATA:  Follow-up pulmonary nodule EXAM: CT CHEST WITHOUT CONTRAST TECHNIQUE: Multidetector CT imaging of the chest was performed following the standard protocol without IV contrast. RADIATION DOSE REDUCTION: This exam was performed according to the departmental dose-optimization program which includes automated exposure control, adjustment of the mA and/or kV according to patient size and/or use of iterative reconstruction technique. COMPARISON:  CT chest dated 10/08/2022 FINDINGS: Cardiovascular: The heart is normal in size. No pericardial effusion. No evidence of thoracic aortic aneurysm. Mild coronary atherosclerosis of the LAD. Mediastinum/Nodes: No suspicious mediastinal lymphadenopathy. Visualized thyroid is unremarkable. Lungs/Pleura: Right upper lobe scarring/architectural distortion with central thin-walled cavitary lesion extending to the hilum, measuring 4.7 x 3.5 cm, grossly unchanged. Adjacent subpleural nodularity in the lateral right upper lobe measuring up to 5 mm (series 3/image 46), unchanged, likely reflecting nodular scarring. No new/suspicious pulmonary nodules. No focal consolidation.  Mild left basilar atelectasis. No pleural effusion or pneumothorax. Upper Abdomen: Visualized upper abdomen is notable for a tiny hiatal hernia, prior cholecystectomy, and vascular calcifications. Musculoskeletal: Degenerative changes the visualized thoracolumbar spine. IMPRESSION: Right upper lobe subpleural nodularity measuring up to 5 mm, unchanged, favoring benign nodular scarring. No follow-up is recommended. Additional stable ancillary findings as above. Electronically Signed   By: Charline Bills M.D.   On: 01/23/2023 00:11     Assessment & Plan:   Obstructive airway disease (HCC) - Former smoker. Pulmonary function testing in May 2024 showed minimal obstructive airway  disease, asthmatic type.  Patient is largely asymptomatic without daily respiratory symptoms.  I do not recommend starting maintenance inhaler unless symptoms worsen. Will send  in Rx for albuterol to use 2 puffs every 4-6 hours as needed for breakthrough shortness of breath or wheezing.  Advised patient notify office if needing to use [place] daily.   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 CT imaging in may 2024 showed right upper lobe scarring/architectural distortion with central thin walled cavitary lesion measuring 4.7 x 3.5cm, grossly unchanged. No follow-up needed.   Upper airway cough syndrome - Patient has an occasional dry cough without sputum production. No associated shortness of breath or wheezing. PND and reflux possible contributors. Recommend trial OTC loratadine 10mg  daily and pepcid 20mg  at bedtime. Follow-up 6 months or sooner if needed.   Glenford Bayley, NP 02/17/2023

## 2023-02-17 NOTE — Patient Instructions (Addendum)
Pulmonary function testing in May 2024 showed minimal obstructive airway disease, asthmatic type  Would not recommend daily maintenance inhaler unless respiratory symptoms are affecting day to day function  I will send in Albuterol inhaler you can use as needed - take 2 puffs every 4-6 hours for shortness of breath/chest tightness or wheezing   RUL subpleural nodularity measuring 5mm remains unchanged, favoring benign nodular scarring. No further follow up needed.    Recommendation: Try OTC Loratadine (Claritin) 10mg  daily for allergies if needed / or use saline nasal spray  Try OTC famotidine (pepcid) 20mg  at bedtime  for reflux symptoms   Follow-up: 6 months with Dr. Jayme Cloud

## 2023-02-20 ENCOUNTER — Telehealth: Payer: Self-pay | Admitting: Physician Assistant

## 2023-02-20 NOTE — Telephone Encounter (Signed)
Contacted Natalie Eaton to schedule their annual wellness visit. Appointment made for 02/25/2023.  Patient wanted to let [name] know that she had asked about her about ways to lose weight.  Her daughter found a low carb diet.  Patient said the low carb diet is working.  She said she's doing it with her daughter.  Thank you,  [facility] Support [facility] Direct dial  [phone]

## 2023-02-21 ENCOUNTER — Encounter: Payer: Self-pay | Admitting: Internal Medicine

## 2023-02-25 ENCOUNTER — Ambulatory Visit (INDEPENDENT_AMBULATORY_CARE_PROVIDER_SITE_OTHER): Payer: Medicare Other

## 2023-02-25 VITALS — Ht 67.0 in | Wt 182.0 lb

## 2023-02-25 DIAGNOSIS — Z0100 Encounter for examination of eyes and vision without abnormal findings: Secondary | ICD-10-CM

## 2023-02-25 DIAGNOSIS — Z0000 Encounter for general adult medical examination without abnormal findings: Secondary | ICD-10-CM

## 2023-02-25 NOTE — Patient Instructions (Signed)
Natalie Eaton , Thank you for taking time to come for your Medicare Wellness Visit. I appreciate your ongoing commitment to your health goals. Please review the following plan we discussed and let me know if I can assist you in the future.   These are the goals we discussed:  Goals   None     This is a list of the screening recommended for you and due dates:  Health Maintenance  Topic Date Due   Pneumonia Vaccine (2 of 2 - PPSV23 or PCV20) 07/05/2023*   DEXA scan (bone density measurement)  07/05/2023*   COVID-19 Vaccine (7 - 2023-24 season) 07/17/2023*   Zoster (Shingles) Vaccine (1 of 2) 07/17/2023*   Mammogram  06/14/2023   Medicare Annual Wellness Visit  02/25/2024   Cologuard (Stool DNA test)  09/22/2025   DTaP/Tdap/Td vaccine (4 - Td or Tdap) 02/03/2032   Hepatitis C Screening  Completed   HPV Vaccine  Aged Out   Flu Shot  Discontinued  *Topic was postponed. The date shown is not the original due date.    Advanced directives: yes  Conditions/risks identified: low falls risk  Next appointment: Follow up in one year for your annual wellness visit 03/01/2024 @ 10:45am telephone   Preventive Care 65 Years and Older, Female Preventive care refers to lifestyle choices and visits with your health care provider that can promote health and wellness. What does preventive care include? A yearly physical exam. This is also called an annual well check. Dental exams once or twice a year. Routine eye exams. Ask your health care provider how often you should have your eyes checked. Personal lifestyle choices, including: Daily care of your teeth and gums. Regular physical activity. Eating a healthy diet. Avoiding tobacco and drug use. Limiting alcohol use. Practicing safe sex. Taking low-dose aspirin every day. Taking vitamin and mineral supplements as recommended by your health care provider. What happens during an annual well check? The services and screenings done by your health  care provider during your annual well check will depend on your age, overall health, lifestyle risk factors, and family history of disease. Counseling  Your health care provider may ask you questions about your: Alcohol use. Tobacco use. Drug use. Emotional well-being. Home and relationship well-being. Sexual activity. Eating habits. History of falls. Memory and ability to understand (cognition). Work and work Astronomer. Reproductive health. Screening  You may have the following tests or measurements: Height, weight, and BMI. Blood pressure. Lipid and cholesterol levels. These may be checked every 5 years, or more frequently if you are over 57 years old. Skin check. Lung cancer screening. You may have this screening every year starting at age 19 if you have a 30-pack-year history of smoking and currently smoke or have quit within the past 15 years. Fecal occult blood test (FOBT) of the stool. You may have this test every year starting at age 54. Flexible sigmoidoscopy or colonoscopy. You may have a sigmoidoscopy every 5 years or a colonoscopy every 10 years starting at age 16. Hepatitis C blood test. Hepatitis B blood test. Sexually transmitted disease (STD) testing. Diabetes screening. This is done by checking your blood sugar (glucose) after you have not eaten for a while (fasting). You may have this done every 1-3 years. Bone density scan. This is done to screen for osteoporosis. You may have this done starting at age 27. Mammogram. This may be done every 1-2 years. Talk to your health care provider about how often you should have  regular mammograms. Talk with your health care provider about your test results, treatment options, and if necessary, the need for more tests. Vaccines  Your health care provider may recommend certain vaccines, such as: Influenza vaccine. This is recommended every year. Tetanus, diphtheria, and acellular pertussis (Tdap, Td) vaccine. You may need a Td  booster every 10 years. Zoster vaccine. You may need this after age 69. Pneumococcal 13-valent conjugate (PCV13) vaccine. One dose is recommended after age 42. Pneumococcal polysaccharide (PPSV23) vaccine. One dose is recommended after age 11. Talk to your health care provider about which screenings and vaccines you need and how often you need them. This information is not intended to replace advice given to you by your health care provider. Make sure you discuss any questions you have with your health care provider. Document Released: 09/29/2015 Document Revised: 05/22/2016 Document Reviewed: 07/04/2015 Elsevier Interactive Patient Education  2017 [employer] Prevention in the Home Falls can cause injuries. They can happen to people of all ages. There are many things you can do to make your home safe and to help prevent falls. What can I do on the outside of my home? Regularly fix the edges of walkways and driveways and fix any cracks. Remove anything that might make you trip as you walk through a door, such as a raised step or threshold. Trim any bushes or trees on the path to your home. Use bright outdoor lighting. Clear any walking paths of anything that might make someone trip, such as rocks or tools. Regularly check to see if handrails are loose or broken. Make sure that both sides of any steps have handrails. Any raised decks and porches should have guardrails on the edges. Have any leaves, snow, or ice cleared regularly. Use sand or salt on walking paths during winter. Clean up any spills in your garage right away. This includes oil or grease spills. What can I do in the bathroom? Use night lights. Install grab bars by the toilet and in the tub and shower. Do not use towel bars as grab bars. Use non-skid mats or decals in the tub or shower. If you need to sit down in the shower, use a plastic, non-slip stool. Keep the floor dry. Clean up any water that spills on the floor  as soon as it happens. Remove soap buildup in the tub or shower regularly. Attach bath mats securely with double-sided non-slip rug tape. Do not have throw rugs and other things on the floor that can make you trip. What can I do in the bedroom? Use night lights. Make sure that you have a light by your bed that is easy to reach. Do not use any sheets or blankets that are too big for your bed. They should not hang down onto the floor. Have a firm chair that has side arms. You can use this for support while you get dressed. Do not have throw rugs and other things on the floor that can make you trip. What can I do in the kitchen? Clean up any spills right away. Avoid walking on wet floors. Keep items that you use a lot in easy-to-reach places. If you need to reach something above you, use a strong step stool that has a grab bar. Keep electrical cords out of the way. Do not use floor polish or wax that makes floors slippery. If you must use wax, use non-skid floor wax. Do not have throw rugs and other things on the floor that  can make you trip. What can I do with my stairs? Do not leave any items on the stairs. Make sure that there are handrails on both sides of the stairs and use them. Fix handrails that are broken or loose. Make sure that handrails are as long as the stairways. Check any carpeting to make sure that it is firmly attached to the stairs. Fix any carpet that is loose or worn. Avoid having throw rugs at the top or bottom of the stairs. If you do have throw rugs, attach them to the floor with carpet tape. Make sure that you have a light switch at the top of the stairs and the bottom of the stairs. If you do not have them, ask someone to add them for you. What else can I do to help prevent falls? Wear shoes that: Do not have high heels. Have rubber bottoms. Are comfortable and fit you well. Are closed at the toe. Do not wear sandals. If you use a stepladder: Make sure that it is  fully opened. Do not climb a closed stepladder. Make sure that both sides of the stepladder are locked into place. Ask someone to hold it for you, if possible. Clearly mark and make sure that you can see: Any grab bars or handrails. First and last steps. Where the edge of each step is. Use tools that help you move around (mobility aids) if they are needed. These include: Canes. Walkers. Scooters. Crutches. Turn on the lights when you go into a dark area. Replace any light bulbs as soon as they burn out. Set up your furniture so you have a clear path. Avoid moving your furniture around. If any of your floors are uneven, fix them. If there are any pets around you, be aware of where they are. Review your medicines with your doctor. Some medicines can make you feel dizzy. This can increase your chance of falling. Ask your doctor what other things that you can do to help prevent falls. This information is not intended to replace advice given to you by your health care provider. Make sure you discuss any questions you have with your health care provider. Document Released: 06/29/2009 Document Revised: 02/08/2016 Document Reviewed: 10/07/2014 Elsevier Interactive Patient Education  2017 [employer].

## 2023-02-25 NOTE — Progress Notes (Signed)
I connected with  Sanaai Sabado Polakowski on 02/25/23 by a audio enabled telemedicine application and verified that I am speaking with the correct person using two identifiers.  Patient Location: Home  Provider Location: Office/Clinic  I discussed the limitations of evaluation and management by telemedicine. The patient expressed understanding and agreed to proceed.  Subjective:   Natalie Eaton is a 67 y.o. female who presents for an Initial Medicare Annual Wellness Visit.  Review of Systems    Cardiac Risk Factors include: advanced age (>46men, >17 women);hypertension     Objective:    Today's Vitals   02/25/23 1054  Weight: 182 lb (82.6 kg)  Height: 5\' 7"  (1.702 m)   Body mass index is 28.51 kg/m.     02/25/2023   11:19 AM 01/02/2023   10:53 AM 10/15/2022    9:33 AM 10/03/2022   10:15 AM 07/23/2018    9:53 AM  Advanced Directives  Does Patient Have a Medical Advance Directive? Yes No Yes No No  Type of Estate agent of [place];Living will  Healthcare Power of [place];Living will    Copy of Healthcare Power of Attorney in Chart?   No - copy requested    Would patient like information on creating a medical advance directive?  No - Patient declined  No - Patient declined No - Patient declined    Current Medications (verified) Outpatient Encounter Medications as of 02/25/2023  Medication Sig   albuterol (VENTOLIN HFA) 108 (90 Base) MCG/ACT inhaler Inhale 2 puffs into the lungs every 6 (six) hours as needed for wheezing or shortness of breath.   aspirin 81 MG chewable tablet Chew 81 mg by mouth daily.   celecoxib (CELEBREX) 200 MG capsule TAKE 1 CAPSULE BY MOUTH TWICE A DAY   hydrochlorothiazide (HYDRODIURIL) 25 MG tablet Take 1 tablet (25 mg total) by mouth daily.   levothyroxine (SYNTHROID) 88 MCG tablet Take 1 tablet (88 mcg total) by mouth daily.   methocarbamol (ROBAXIN) 750 MG tablet Take 1 tablet (750 mg total) by mouth 2 (two) times daily as needed for  muscle spasms.   Multiple Vitamin (MULTIVITAMIN ADULT PO) Take 1 each by mouth daily at 2 PM.   pantoprazole (PROTONIX) 40 MG tablet Take 1 tablet (40 mg total) by mouth daily.   potassium chloride (MICRO-K) 10 MEQ CR capsule Take 1 capsule (10 mEq total) by mouth 2 (two) times daily.   rosuvastatin (CRESTOR) 10 MG tablet Take 1 tablet (10 mg total) by mouth daily.   traMADol (ULTRAM) 50 MG tablet Take 50 mg by mouth 3 (three) times daily.   traZODone (DESYREL) 100 MG tablet Take 100 mg by mouth at bedtime.   valACYclovir (VALTREX) 500 MG tablet Take 1 tablet (500 mg total) by mouth daily. Or BID for 3 days prn sx   gabapentin (NEURONTIN) 300 MG capsule Take 1 capsule (300 mg total) by mouth 3 (three) times daily.   No facility-administered encounter medications on file as of 02/25/2023.    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, per gyn   Hypertension    Hypothyroidism    Insomnia    Leukemia (HCC)  MVP (mitral valve prolapse)    Pneumonia    Pre-diabetes    Past Surgical History:  Procedure Laterality Date   ABDOMINAL HYSTERECTOMY     BLADDER SURGERY     CARDIAC CATHETERIZATION     GALLBLADDER SURGERY     LUNG REMOVAL, PARTIAL     ROTATOR CUFF REPAIR     TONSILLECTOMY     TUBAL LIGATION     Family History  Problem Relation Age of Onset   Healthy Mother    Scoliosis Mother    Heart disease Father    Heart disease Brother    Healthy Daughter    Healthy Son    Social History   Socioeconomic History   Marital status: Widowed    Spouse name: Not on file   Number of children: 2   Years of education: Not on file   Highest education level: Not on file  Occupational History   Not on file  Tobacco  Use   Smoking status: Former    Packs/day: 0.25    Years: 15.00    Additional pack years: 0.00    Total pack years: 3.75    Types: Cigarettes    Quit date: 105    Years since quitting: 28.4   Smokeless tobacco: Never  Vaping Use   Vaping Use: Never used  Substance and Sexual Activity   Alcohol use: Yes    Alcohol/week: 1.0 standard drink of alcohol    Types: 1 Glasses of wine per week   Drug use: No   Sexual activity: Not Currently    Birth control/protection: None  Other Topics Concern   Not on file  Social History Narrative   Not on file   Social Determinants of Health   Financial Resource Strain: Low Risk  (02/25/2023)   Overall Financial Resource Strain (CARDIA)    Difficulty of Paying Living Expenses: Not very hard  Food Insecurity: No Food Insecurity (02/25/2023)   Hunger Vital Sign    Worried About Running Out of Food in the Last Year: Never true    Ran Out of Food in the Last Year: Never true  Transportation Needs: No Transportation Needs (02/25/2023)   PRAPARE - Administrator, Civil Service (Medical): No    Lack of Transportation (Non-Medical): No  Physical Activity: Sufficiently Active (02/25/2023)   Exercise Vital Sign    Days of Exercise per Week: 5 days    Minutes of Exercise per Session: 30 min  Stress: No Stress Concern Present (02/25/2023)   [employer] of Occupational Health - Occupational Stress Questionnaire    Feeling of Stress : Not at all  Social Connections: Moderately Isolated (02/25/2023)   Social Connection and Isolation Panel [NHANES]    Frequency of Communication with Friends and Family: More than three times a week    Frequency of Social Gatherings with Friends and Family: Never    Attends Religious Services: More than 4 times per year    Active Member of [employer] or Organizations: No    Attends Banker Meetings: Never    Marital Status: Widowed    Tobacco Counseling Counseling given: Not Answered   Clinical  Intake:  Pre-visit preparation completed: Yes  Pain : No/denies pain took Tramadol this morning   BMI - recorded: 28.51 Nutritional Status: BMI 25 -29 Overweight Nutritional Risks: None Diabetes: No  How often do you need to have someone help you when you read instructions, pamphlets, or other written materials from your doctor  or pharmacy?: 1 - Never  Diabetic?no  Interpreter Needed?: No  Comments: lives alone Information entered by :: B.Astraea Gaughran,LPN   Activities of Daily Living    02/25/2023   11:22 AM 10/15/2022    9:34 AM  In your present state of health, do you have any difficulty performing the following activities:  Hearing? 0 0  Vision? 1 0  Difficulty concentrating or making decisions? 1 0  Comment sometimes concentrating   Walking or climbing stairs? 1 1  Dressing or bathing? 0 0  Doing errands, shopping? 0 0  Preparing Food and eating ? N   Using the Toilet? N   In the past six months, have you accidently leaked urine? N   Do you have problems with loss of bowel control? N   Managing your Medications? N   Managing your Finances? N   Housekeeping or managing your Housekeeping? N     Patient Care Team: Alfredia Ferguson, PA-C as PCP - General (Physician Assistant)  Indicate any recent Medical Services you may have received from other than [facility] providers in the past year (date may be approximate).     Assessment:   This is a routine wellness examination for Kelanie.  Hearing/Vision screen Hearing Screening - Comments:: Adequate hearing Vision Screening - Comments:: Adequate vision up close Does not have a eye dr Larose Kells referral made  Dietary issues and exercise activities discussed: Current Exercise Habits: Home exercise routine, Type of exercise: walking, Time (Minutes): 30, Frequency (Times/Week): 5, Weekly Exercise (Minutes/Week): 150, Intensity: Mild, Exercise limited by: cardiac condition(s);orthopedic condition(s)   Goals Addressed   None     Depression Screen    02/25/2023   11:17 AM 10/15/2022    9:34 AM 09/20/2022    1:56 PM 07/04/2022    9:22 AM 11/09/2020   11:47 AM 09/25/2020    3:56 PM 09/01/2020    9:38 AM  PHQ 2/9 Scores  PHQ - 2 Score 0 0 0 0 6 0 0  PHQ- 9 Score  7 6  15       Fall Risk    02/25/2023   11:09 AM 10/15/2022    9:34 AM 09/20/2022    1:56 PM 07/04/2022    9:21 AM 09/25/2020    3:56 PM  Fall Risk   Falls in the past year? 0 0 1 1 0  Number falls in past yr: 0 0 0 1   Injury with Fall? 0 1 1 1    Risk for fall due to : No Fall Risks History of fall(s) History of fall(s) History of fall(s)   Follow up Education provided;Falls prevention discussed Falls evaluation completed Falls evaluation completed;Education provided;Falls prevention discussed Falls evaluation completed Falls evaluation completed    FALL RISK PREVENTION PERTAINING TO THE HOME:  Any stairs in or around the home? Yes  If so, are there any without handrails? Yes  Home free of loose throw rugs in walkways, pet beds, electrical cords, etc? Yes  Adequate lighting in your home to reduce risk of falls? Yes   ASSISTIVE DEVICES UTILIZED TO PREVENT FALLS:  Life alert? No  Use of a cane, walker or w/c? Yes cane when back pain Grab bars in the bathroom? Yes  Shower chair or bench in shower? Yes  Elevated toilet seat or a handicapped toilet? Yes   Cognitive Function:        02/25/2023   11:24 AM  6CIT Screen  What Year? 0 points  What month? 0 points  What  time? 0 points  Count back from 20 0 points  Months in reverse 0 points  Repeat phrase 0 points  Total Score 0 points    Immunizations Immunization History  Administered Date(s) Administered   Influenza,inj,Quad PF,6+ Mos 08/21/2018, 06/10/2019   PFIZER(Purple Top)SARS-COV-2 Vaccination 12/09/2019, 01/01/2020, 07/04/2020   Pneumococcal Conjugate-13 06/02/2020   Pneumococcal-Unspecified 09/17/2019   Tdap 09/16/2018, 08/11/2020, 02/02/2022   Unspecified SARS-COV-2  Vaccination 12/09/2019, 01/01/2020, 12/14/2020    TDAP status: Up to date  Flu Vaccine status: Declined, Education has been provided regarding the importance of this vaccine but patient still declined. Advised may receive this vaccine at local pharmacy or Health Dept. Aware to provide a copy of the vaccination record if obtained from local pharmacy or Health Dept. Verbalized acceptance and understanding.  Pneumococcal vaccine status: Up to date  Covid-19 vaccine status: Completed vaccines  Qualifies for Shingles Vaccine? Yes   Zostavax completed No   Shingrix Completed?: No.    Education has been provided regarding the importance of this vaccine. Patient has been advised to call insurance company to determine out of pocket expense if they have not yet received this vaccine. Advised may also receive vaccine at local pharmacy or Health Dept. Verbalized acceptance and understanding.  Screening Tests Health Maintenance  Topic Date Due   Pneumonia Vaccine 69+ Years old (2 of 2 - PPSV23 or PCV20) 07/05/2023 (Originally 07/28/2020)   DEXA SCAN  07/05/2023 (Originally 06/01/2020)   COVID-19 Vaccine (7 - 2023-24 season) 07/17/2023 (Originally 05/17/2022)   Zoster Vaccines- Shingrix (1 of 2) 07/17/2023 (Originally 04/19/1975)   MAMMOGRAM  06/14/2023   Medicare Annual Wellness (AWV)  02/25/2024   Fecal DNA (Cologuard)  09/22/2025   DTaP/Tdap/Td (4 - Td or Tdap) 02/03/2032   Hepatitis C Screening  Completed   HPV VACCINES  Aged Out   INFLUENZA VACCINE  Discontinued    Health Maintenance  There are no preventive care reminders to display for this patient.   Colorectal cancer screening: Type of screening: Cologuard. Completed yes. Repeat every 3 years  Mammogram status: Ordered yes. Pt provided with contact info and advised to call to schedule appt.   Bone Density status: Completed yes. Results reflect: Bone density results: NORMAL. Repeat every 5 years.  Lung Cancer Screening: (Low Dose CT  Chest recommended if Age 52-80 years, 30 pack-year currently smoking OR have quit w/in 15years.) does not qualify.   Lung Cancer Screening Referral: no  Additional Screening:  Hepatitis C Screening: does not qualify; Completed yes  Vision Screening: Recommended annual ophthalmology exams for early detection of glaucoma and other disorders of the eye. Is the patient up to date with their annual eye exam?  Yes  Who is the provider or what is the name of the office in which the patient attends annual eye exams? none If pt is not established with a provider, would they like to be referred to a provider to establish care? No .   Dental Screening: Recommended annual dental exams for proper oral hygiene  Community Resource Referral / Chronic Care Management: CRR required this visit?  No   CCM required this visit?  No     Plan:     I have personally reviewed and noted the following in the patient's chart:   Medical and social history Use of alcohol, tobacco or illicit drugs  Current medications and supplements including opioid prescriptions. Patient is not currently taking opioid prescriptions. Functional ability and status Nutritional status Physical activity Advanced directives List of other  physicians Hospitalizations, surgeries, and ER visits in previous 12 months Vitals Screenings to include cognitive, depression, and falls Referrals and appointments  In addition, I have reviewed and discussed with patient certain preventive protocols, quality metrics, and best practice recommendations. A written personalized care plan for preventive services as well as general preventive health recommendations were provided to patient.   Sue Lush, LPN   [id number]   Nurse Notes: The patient states she is doing well and has no concerns or questions at this time. She does relay she has a hard nodule on her left thumb, it is not painful only a little sore.If worsens she will make appt to  be seen.  -referral for eye exam

## 2023-02-26 NOTE — Progress Notes (Signed)
Agree with the details of the visit as noted by Elizabeth Walsh, NP.  C. Laura Tian Mcmurtrey, MD [facility] 

## 2023-03-08 ENCOUNTER — Other Ambulatory Visit: Payer: Self-pay | Admitting: Physician Assistant

## 2023-03-08 DIAGNOSIS — M1990 Unspecified osteoarthritis, unspecified site: Secondary | ICD-10-CM

## 2023-03-10 ENCOUNTER — Other Ambulatory Visit: Payer: Self-pay | Admitting: Physician Assistant

## 2023-03-10 DIAGNOSIS — M5137 Other intervertebral disc degeneration, lumbosacral region: Secondary | ICD-10-CM

## 2023-03-10 DIAGNOSIS — E7800 Pure hypercholesterolemia, unspecified: Secondary | ICD-10-CM

## 2023-03-10 DIAGNOSIS — G8929 Other chronic pain: Secondary | ICD-10-CM

## 2023-03-10 DIAGNOSIS — M5441 Lumbago with sciatica, right side: Secondary | ICD-10-CM

## 2023-03-10 NOTE — Telephone Encounter (Signed)
Requested medication (s) are due for refill today: yes  Requested medication (s) are on the active medication list: yes  Last refill:  01/02/23  Future visit scheduled: yes  Notes to clinic:  Unable to refill per protocol, Rx request not attached to protocol. Routing for review.      Requested Prescriptions  Pending Prescriptions Disp Refills   celecoxib (CELEBREX) 200 MG capsule [Pharmacy Med Name: CELECOXIB 200 MG CAPSULE] 120 capsule 0    Sig: TAKE 1 CAPSULE BY MOUTH TWICE A DAY     There is no refill protocol information for this order

## 2023-03-11 NOTE — Telephone Encounter (Signed)
Requested Prescriptions  Pending Prescriptions Disp Refills   rosuvastatin (CRESTOR) 10 MG tablet [Pharmacy Med Name: ROSUVASTATIN CALCIUM 10 MG TAB] 90 tablet 0    Sig: TAKE 1 TABLET BY MOUTH EVERY DAY     Cardiovascular:  Antilipid - Statins 2 Failed - 03/10/2023  3:41 PM      Failed - Lipid Panel in normal range within the last 12 months    Cholesterol, Total  Date Value Ref Range Status  01/24/2020 193 100 - 199 mg/dL Final   Cholesterol  Date Value Ref Range Status  07/04/2022 168 <200 mg/dL Final   LDL Cholesterol (Calc)  Date Value Ref Range Status  07/04/2022 97 mg/dL (calc) Final    Comment:    Reference range: <100 . Desirable range <100 mg/dL for primary prevention;   <70 mg/dL for patients with CHD or diabetic patients  with > or = 2 CHD risk factors. Marland Kitchen LDL-C is now calculated using the Martin-Hopkins  calculation, which is a validated novel method providing  better accuracy than the Friedewald equation in the  estimation of LDL-C.  Horald Pollen et al. Lenox Ahr. 4098;119(14): 2061-2068  (http://education.[facility].com/faq/FAQ164)    HDL  Date Value Ref Range Status  07/04/2022 37 (L) > OR = 50 mg/dL Final  78/29/5621 37 (L) >39 mg/dL Final   Triglycerides  Date Value Ref Range Status  07/04/2022 216 (H) <150 mg/dL Final    Comment:    . If a non-fasting specimen was collected, consider repeat triglyceride testing on a fasting specimen if clinically indicated.  Perry Mount et al. J. of Clin. Lipidol. 2015;9:129-169. Marland Kitchen          Passed - Cr in normal range and within 360 days    Creat  Date Value Ref Range Status  07/04/2022 0.77 0.50 - 1.05 mg/dL Final   Creatinine, Ser  Date Value Ref Range Status  01/02/2023 0.73 0.44 - 1.00 mg/dL Final         Passed - Patient is not pregnant      Passed - Valid encounter within last 12 months    Recent Outpatient Visits           4 months ago Pneumatocele of lung   [facility]  [place] Edwards, Davis, PA-C   5 months ago Flank pain   [place] [facility] Merita Norton T, FNP   5 months ago Hypothyroidism, unspecified type   [facility] Alfredia Ferguson, PA-C   2 years ago Depression, unspecified depression type   [facility], Asencion Partridge, MD   2 years ago Leukocytosis, unspecified type   [facility], Asencion Partridge, MD               methocarbamol (ROBAXIN) 750 MG tablet [Pharmacy Med Name: METHOCARBAMOL 750 MG TABLET] 30 tablet 0    Sig: TAKE 1 TABLET (750 MG TOTAL) BY MOUTH 2 (TWO) TIMES DAILY AS NEEDED FOR MUSCLE SPASMS.     Not Delegated - Analgesics:  Muscle Relaxants Failed - 03/10/2023  3:41 PM      Failed - This refill cannot be delegated      Passed - Valid encounter within last 6 months    Recent Outpatient Visits           4 months ago Pneumatocele of lung   [facility] Alfredia Ferguson, PA-C   5 months ago Flank pain   [facility] Jacky Kindle, FNP   5 months ago Hypothyroidism, unspecified type   [facility] Alfredia Ferguson, PA-C   2 years ago Depression, unspecified depression type   [facility], Asencion Partridge, MD   2 years ago Leukocytosis, unspecified type   [facility], Asencion Partridge, MD               gabapentin (NEURONTIN) 300 MG capsule [Pharmacy Med Name: GABAPENTIN 300 MG CAPSULE] 270 capsule 1    Sig: TAKE 1 CAPSULE BY MOUTH THREE TIMES A DAY     Neurology: Anticonvulsants - gabapentin Passed - 03/10/2023  3:41 PM      Passed - Cr in normal range and within 360 days    Creat  Date Value Ref Range Status  07/04/2022 0.77 0.50 - 1.05 mg/dL Final   Creatinine, Ser  Date Value Ref Range Status  01/02/2023 0.73 0.44 - 1.00 mg/dL Final         Passed - Completed PHQ-2 or PHQ-9 in the last 360 days      Passed - Valid encounter within last 12  months    Recent Outpatient Visits           4 months ago Pneumatocele of lung   [place] [facility] Alfredia Ferguson, PA-C   5 months ago Flank pain   [place] [facility] Merita Norton T, FNP   5 months ago Hypothyroidism, unspecified type   [facility] Alfredia Ferguson, PA-C   2 years ago Depression, unspecified depression type   [facility], Asencion Partridge, MD   2 years ago Leukocytosis, unspecified type   [facility], Asencion Partridge, MD

## 2023-03-11 NOTE — Telephone Encounter (Signed)
Requested medication (s) are due for refill today: Yes  Requested medication (s) are on the active medication list: Yes  Last refill:  09/20/22 for gabapentin, 12/04/22 for Methocarbamol  Future visit scheduled: No  Notes to clinic:  Unable to refill per protocol, cannot delegate for Methocarbamol. Unable to refill per protocol, Rx expired for Gabapentin.     Requested Prescriptions  Pending Prescriptions Disp Refills   methocarbamol (ROBAXIN) 750 MG tablet [Pharmacy Med Name: METHOCARBAMOL 750 MG TABLET] 30 tablet 0    Sig: TAKE 1 TABLET (750 MG TOTAL) BY MOUTH 2 (TWO) TIMES DAILY AS NEEDED FOR MUSCLE SPASMS.     Not Delegated - Analgesics:  Muscle Relaxants Failed - 03/10/2023  3:41 PM      Failed - This refill cannot be delegated      Passed - Valid encounter within last 6 months    Recent Outpatient Visits           4 months ago Pneumatocele of lung   [place] [facility] Alfredia Ferguson, PA-C   5 months ago Flank pain   [facility] Merita Norton T, FNP   5 months ago Hypothyroidism, unspecified type   [facility] Alfredia Ferguson, PA-C   2 years ago Depression, unspecified depression type   [facility], Asencion Partridge, MD   2 years ago Leukocytosis, unspecified type   [facility], Asencion Partridge, MD               gabapentin (NEURONTIN) 300 MG capsule [Pharmacy Med Name: GABAPENTIN 300 MG CAPSULE] 270 capsule 1    Sig: TAKE 1 CAPSULE BY MOUTH THREE TIMES A DAY     Neurology: Anticonvulsants - gabapentin Passed - 03/10/2023  3:41 PM      Passed - Cr in normal range and within 360 days    Creat  Date Value Ref Range Status  07/04/2022 0.77 0.50 - 1.05 mg/dL Final   Creatinine, Ser  Date Value Ref Range Status  01/02/2023 0.73 0.44 - 1.00 mg/dL Final         Passed - Completed PHQ-2 or PHQ-9 in the last 360 days      Passed - Valid encounter within last 12 months     Recent Outpatient Visits           4 months ago Pneumatocele of lung   [place] [facility] Alfredia Ferguson, PA-C   5 months ago Flank pain    [facility] Merita Norton T, FNP   5 months ago Hypothyroidism, unspecified type   [facility] Alfredia Ferguson, PA-C   2 years ago Depression, unspecified depression type   [facility], Asencion Partridge, MD   2 years ago Leukocytosis, unspecified type   [facility], Asencion Partridge, MD              Signed Prescriptions Disp Refills   rosuvastatin (CRESTOR) 10 MG tablet 90 tablet 0    Sig: TAKE 1 TABLET BY MOUTH EVERY DAY     Cardiovascular:  Antilipid - Statins 2 Failed - 03/10/2023  3:41 PM      Failed - Lipid Panel in normal range within the last 12 months    Cholesterol, Total  Date Value Ref Range Status  01/24/2020 193 100 - 199 mg/dL Final   Cholesterol  Date Value Ref Range Status  07/04/2022  168 <200 mg/dL Final   LDL Cholesterol (Calc)  Date Value Ref Range Status  07/04/2022 97 mg/dL (calc) Final    Comment:    Reference range: <100 . Desirable range <100 mg/dL for primary prevention;   <70 mg/dL for patients with CHD or diabetic patients  with > or = 2 CHD risk factors. Marland Kitchen LDL-C is now calculated using the Martin-Hopkins  calculation, which is a validated novel method providing  better accuracy than the Friedewald equation in the  estimation of LDL-C.  Horald Pollen et al. Lenox Ahr. 5732;202(54): 2061-2068  (http://education.[facility].com/faq/FAQ164)    HDL  Date Value Ref Range Status  07/04/2022 37 (L) > OR = 50 mg/dL Final  27/02/2375 37 (L) >39 mg/dL Final   Triglycerides  Date Value Ref Range Status  07/04/2022 216 (H) <150 mg/dL Final    Comment:    . If a non-fasting specimen was collected, consider repeat triglyceride testing on a fasting specimen if clinically indicated.  Perry Mount et al. J. of Clin.  Lipidol. 2015;9:129-169. Marland Kitchen          Passed - Cr in normal range and within 360 days    Creat  Date Value Ref Range Status  07/04/2022 0.77 0.50 - 1.05 mg/dL Final   Creatinine, Ser  Date Value Ref Range Status  01/02/2023 0.73 0.44 - 1.00 mg/dL Final         Passed - Patient is not pregnant      Passed - Valid encounter within last 12 months    Recent Outpatient Visits           4 months ago Pneumatocele of lung   [place] [facility] Alfredia Ferguson, PA-C   5 months ago Flank pain   [place] [facility] Merita Norton T, FNP   5 months ago Hypothyroidism, unspecified type   [facility] Alfredia Ferguson, PA-C   2 years ago Depression, unspecified depression type   [facility], Asencion Partridge, MD   2 years ago Leukocytosis, unspecified type   [facility], Asencion Partridge, MD

## 2023-03-17 ENCOUNTER — Encounter: Payer: Self-pay | Admitting: Physician Assistant

## 2023-03-17 ENCOUNTER — Ambulatory Visit (INDEPENDENT_AMBULATORY_CARE_PROVIDER_SITE_OTHER): Payer: Medicare Other | Admitting: Physician Assistant

## 2023-03-17 VITALS — BP 123/74 | HR 54 | Ht 67.0 in | Wt 184.2 lb

## 2023-03-17 DIAGNOSIS — M79675 Pain in left toe(s): Secondary | ICD-10-CM | POA: Diagnosis not present

## 2023-03-17 DIAGNOSIS — M5442 Lumbago with sciatica, left side: Secondary | ICD-10-CM | POA: Diagnosis not present

## 2023-03-17 DIAGNOSIS — M25842 Other specified joint disorders, left hand: Secondary | ICD-10-CM

## 2023-03-17 DIAGNOSIS — M5441 Lumbago with sciatica, right side: Secondary | ICD-10-CM

## 2023-03-17 DIAGNOSIS — E7800 Pure hypercholesterolemia, unspecified: Secondary | ICD-10-CM

## 2023-03-17 DIAGNOSIS — Z1231 Encounter for screening mammogram for malignant neoplasm of breast: Secondary | ICD-10-CM

## 2023-03-17 MED ORDER — ROSUVASTATIN CALCIUM 10 MG PO TABS: 10.0000 mg | ORAL_TABLET | Freq: Every day | ORAL | 6 refills | Status: DC

## 2023-03-17 MED ORDER — METHOCARBAMOL 750 MG PO TABS: 750.0000 mg | ORAL_TABLET | Freq: Two times a day (BID) | ORAL | 3 refills | Status: DC | PRN

## 2023-03-17 NOTE — Progress Notes (Addendum)
Established patient visit   Patient: Natalie Eaton   DOB: [birth year]   68 y.o. Female  MRN: [id number] Visit Date: 03/17/2023  Today's healthcare provider: Alfredia Ferguson, PA-C   Chief Complaint  Patient presents with   Medication Refill   Subjective    HPI Discussed the use of AI scribe software for clinical note transcription with the patient, who gave verbal consent to proceed.  History of Present Illness   Pt reports a bump on her left thumb, which is not causing any discomfort.    She also mentions a red spot on her left pinky toe that has been causing sharp, throbbing pain at night. The patient describes this pain as burning and states that it has been present for the past two nights.  In addition, the patient reports a wrinkle behind the retina, as diagnosed by an ophthalmologist. She expresses concern about potential surgery, citing a fear of anesthesia and a history of complications from previous surgeries.       Medications: Outpatient Medications Prior to Visit  Medication Sig   aspirin 81 MG chewable tablet Chew 81 mg by mouth daily.   celecoxib (CELEBREX) 200 MG capsule TAKE 1 CAPSULE BY MOUTH TWICE A DAY   gabapentin (NEURONTIN) 300 MG capsule TAKE 1 CAPSULE BY MOUTH THREE TIMES A DAY   hydrochlorothiazide (HYDRODIURIL) 25 MG tablet Take 1 tablet (25 mg total) by mouth daily.   levothyroxine (SYNTHROID) 88 MCG tablet Take 1 tablet (88 mcg total) by mouth daily.   Multiple Vitamin (MULTIVITAMIN ADULT PO) Take 1 each by mouth daily at 2 PM.   potassium chloride (MICRO-K) 10 MEQ CR capsule Take 1 capsule (10 mEq total) by mouth 2 (two) times daily.   traMADol (ULTRAM) 50 MG tablet Take 50 mg by mouth 3 (three) times daily.   traZODone (DESYREL) 100 MG tablet Take 100 mg by mouth at bedtime.   valACYclovir (VALTREX) 500 MG tablet Take 1 tablet (500 mg total) by mouth daily. Or BID for 3 days prn sx   [DISCONTINUED] methocarbamol (ROBAXIN) 750 MG tablet TAKE 1  TABLET (750 MG TOTAL) BY MOUTH 2 (TWO) TIMES DAILY AS NEEDED FOR MUSCLE SPASMS.   [DISCONTINUED] rosuvastatin (CRESTOR) 10 MG tablet TAKE 1 TABLET BY MOUTH EVERY DAY   albuterol (VENTOLIN HFA) 108 (90 Base) MCG/ACT inhaler Inhale 2 puffs into the lungs every 6 (six) hours as needed for wheezing or shortness of breath. (Patient not taking: Reported on 03/17/2023)   pantoprazole (PROTONIX) 40 MG tablet Take 1 tablet (40 mg total) by mouth daily. (Patient not taking: Reported on 03/17/2023)   No facility-administered medications prior to visit.    Review of Systems  Constitutional:  Negative for fatigue and fever.  Respiratory:  Negative for cough and shortness of breath.   Cardiovascular:  Negative for chest pain and leg swelling.  Gastrointestinal:  Negative for abdominal pain.  Neurological:  Negative for dizziness and headaches.       Objective    BP 123/74 (BP Location: Left Arm, Patient Position: Sitting, Cuff Size: Normal)   Pulse (!) 54   Ht 5\' 7"  (1.702 m)   Wt 184 lb 3.2 oz (83.6 kg)   SpO2 100%   BMI 28.85 kg/m   Physical Exam Vitals reviewed.  Constitutional:      Appearance: She is not ill-appearing.  HENT:     Head: Normocephalic.  Eyes:     Conjunctiva/sclera: Conjunctivae normal.  Cardiovascular:  Rate and Rhythm: Normal rate.  Pulmonary:     Effort: Pulmonary effort is normal. No respiratory distress.  Musculoskeletal:     Right lower leg: Edema present.     Left lower leg: Edema present.     Comments: L pinky toe with some erythema on spots her shoe is rubbing. No ulceration or skin breakdown  Neurological:     General: No focal deficit present.     Mental Status: She is alert and oriented to person, place, and time.  Psychiatric:        Mood and Affect: Mood normal.        Behavior: Behavior normal.      No results found for any visits on 03/17/23.  Assessment & Plan     Assessment and Plan  Pure hypercholesterolemia Pulled for refill -  rosuvastatin (CRESTOR) 10 MG tablet; Take 1 tablet (10 mg total) by mouth daily.  Dispense: 30 tablet; Refill: 6  Low back pain with bilateral sciatica, unspecified back pain laterality, unspecified chronicity Pulled for refill prn use - methocarbamol (ROBAXIN) 750 MG tablet; Take 1 tablet (750 mg total) by mouth 2 (two) times daily as needed for muscle spasms.  Dispense: 30 tablet; Refill: 3   Cyst of joint of left hand L thumb with a ~2 mm cyst. If not troublesome would leave alone.   Toe Pain:  -Advise patient to monitor for changes and to consider changing footwear if irritation continues. No ulceration or bleeding present. Capillary refill intact.  Retinal change: Patient reports diagnosis of retinal pucker by ophthalmologist, with potential need for surgery in the future. -Advise patient to continue follow-up with ophthalmologist and to discuss pros and cons of potential surgery.  General Health Maintenance: Mammogram due in September. -Remind patient to schedule mammogram.       Return in about 6 months (around 09/17/2023) for chronic conditions.      I, Alfredia Ferguson, PA-C have reviewed all documentation for this visit. The documentation on  03/17/23   for the exam, diagnosis, procedures, and orders are all accurate and complete.  Alfredia Ferguson, PA-C [facility] [address] [place], [place], [address] Office: [phone] Fax: [phone]   [facility]

## 2023-03-31 ENCOUNTER — Encounter: Payer: Self-pay | Admitting: Family Medicine

## 2023-03-31 ENCOUNTER — Encounter: Payer: Self-pay | Admitting: Physician Assistant

## 2023-03-31 DIAGNOSIS — E039 Hypothyroidism, unspecified: Secondary | ICD-10-CM

## 2023-04-02 ENCOUNTER — Other Ambulatory Visit: Payer: Self-pay | Admitting: Family Medicine

## 2023-04-03 LAB — TSH: TSH: 0.475 u[IU]/mL (ref 0.450–4.500)

## 2023-04-07 ENCOUNTER — Encounter: Payer: Self-pay | Admitting: Family Medicine

## 2023-04-07 DIAGNOSIS — E039 Hypothyroidism, unspecified: Secondary | ICD-10-CM

## 2023-04-08 MED ORDER — LEVOTHYROXINE SODIUM 75 MCG PO TABS: 75.0000 ug | ORAL_TABLET | Freq: Every day | ORAL | 1 refills | Status: DC

## 2023-05-09 ENCOUNTER — Other Ambulatory Visit: Payer: Self-pay | Admitting: Family Medicine

## 2023-05-09 DIAGNOSIS — E039 Hypothyroidism, unspecified: Secondary | ICD-10-CM

## 2023-06-15 ENCOUNTER — Other Ambulatory Visit: Payer: Self-pay | Admitting: Nurse Practitioner

## 2023-06-15 DIAGNOSIS — I10 Essential (primary) hypertension: Secondary | ICD-10-CM

## 2023-07-04 ENCOUNTER — Inpatient Hospital Stay: Payer: Medicare Other | Attending: Internal Medicine

## 2023-07-04 ENCOUNTER — Inpatient Hospital Stay (HOSPITAL_BASED_OUTPATIENT_CLINIC_OR_DEPARTMENT_OTHER): Payer: Medicare Other | Admitting: Internal Medicine

## 2023-07-04 VITALS — BP 115/78 | HR 88 | Temp 98.7°F | Wt 194.0 lb

## 2023-07-04 DIAGNOSIS — Z90710 Acquired absence of both cervix and uterus: Secondary | ICD-10-CM | POA: Diagnosis not present

## 2023-07-04 DIAGNOSIS — E039 Hypothyroidism, unspecified: Secondary | ICD-10-CM | POA: Diagnosis not present

## 2023-07-04 DIAGNOSIS — M1990 Unspecified osteoarthritis, unspecified site: Secondary | ICD-10-CM | POA: Diagnosis not present

## 2023-07-04 DIAGNOSIS — I2510 Atherosclerotic heart disease of native coronary artery without angina pectoris: Secondary | ICD-10-CM | POA: Insufficient documentation

## 2023-07-04 DIAGNOSIS — R160 Hepatomegaly, not elsewhere classified: Secondary | ICD-10-CM | POA: Insufficient documentation

## 2023-07-04 DIAGNOSIS — R1013 Epigastric pain: Secondary | ICD-10-CM | POA: Insufficient documentation

## 2023-07-04 DIAGNOSIS — Z885 Allergy status to narcotic agent status: Secondary | ICD-10-CM | POA: Diagnosis not present

## 2023-07-04 DIAGNOSIS — Z8249 Family history of ischemic heart disease and other diseases of the circulatory system: Secondary | ICD-10-CM | POA: Insufficient documentation

## 2023-07-04 DIAGNOSIS — C9110 Chronic lymphocytic leukemia of B-cell type not having achieved remission: Secondary | ICD-10-CM | POA: Diagnosis not present

## 2023-07-04 DIAGNOSIS — K449 Diaphragmatic hernia without obstruction or gangrene: Secondary | ICD-10-CM | POA: Insufficient documentation

## 2023-07-04 DIAGNOSIS — R5383 Other fatigue: Secondary | ICD-10-CM | POA: Insufficient documentation

## 2023-07-04 DIAGNOSIS — R918 Other nonspecific abnormal finding of lung field: Secondary | ICD-10-CM | POA: Insufficient documentation

## 2023-07-04 DIAGNOSIS — Z79899 Other long term (current) drug therapy: Secondary | ICD-10-CM | POA: Diagnosis not present

## 2023-07-04 DIAGNOSIS — I10 Essential (primary) hypertension: Secondary | ICD-10-CM | POA: Diagnosis not present

## 2023-07-04 DIAGNOSIS — J984 Other disorders of lung: Secondary | ICD-10-CM | POA: Insufficient documentation

## 2023-07-04 DIAGNOSIS — Z87820 Personal history of traumatic brain injury: Secondary | ICD-10-CM | POA: Insufficient documentation

## 2023-07-04 LAB — CBC WITH DIFFERENTIAL/PLATELET
Abs Immature Granulocytes: 0.04 10*3/uL (ref 0.00–0.07)
Basophils Absolute: 0.1 10*3/uL (ref 0.0–0.1)
Basophils Relative: 1 %
Eosinophils Absolute: 0.3 10*3/uL (ref 0.0–0.5)
Eosinophils Relative: 2 %
HCT: 42.3 % (ref 36.0–46.0)
Hemoglobin: 13.8 g/dL (ref 12.0–15.0)
Immature Granulocytes: 0 %
Lymphocytes Relative: 50 %
Lymphs Abs: 5.9 10*3/uL — ABNORMAL HIGH (ref 0.7–4.0)
MCH: 31.3 pg (ref 26.0–34.0)
MCHC: 32.6 g/dL (ref 30.0–36.0)
MCV: 95.9 fL (ref 80.0–100.0)
Monocytes Absolute: 0.5 10*3/uL (ref 0.1–1.0)
Monocytes Relative: 4 %
Neutro Abs: 5.1 10*3/uL (ref 1.7–7.7)
Neutrophils Relative %: 43 %
Platelets: 236 10*3/uL (ref 150–400)
RBC: 4.41 MIL/uL (ref 3.87–5.11)
RDW: 13.5 % (ref 11.5–15.5)
Smear Review: NORMAL
WBC: 11.8 10*3/uL — ABNORMAL HIGH (ref 4.0–10.5)
nRBC: 0 % (ref 0.0–0.2)

## 2023-07-04 LAB — COMPREHENSIVE METABOLIC PANEL
ALT: 17 U/L (ref 0–44)
AST: 15 U/L (ref 15–41)
Albumin: 4.3 g/dL (ref 3.5–5.0)
Alkaline Phosphatase: 62 U/L (ref 38–126)
Anion gap: 9 (ref 5–15)
BUN: 38 mg/dL — ABNORMAL HIGH (ref 8–23)
CO2: 26 mmol/L (ref 22–32)
Calcium: 9.4 mg/dL (ref 8.9–10.3)
Chloride: 102 mmol/L (ref 98–111)
Creatinine, Ser: 0.72 mg/dL (ref 0.44–1.00)
GFR, Estimated: >60 mL/min (ref >60)
Glucose, Bld: 103 mg/dL — ABNORMAL HIGH (ref 70–99)
Potassium: 3.7 mmol/L (ref 3.5–5.1)
Sodium: 137 mmol/L (ref 135–145)
Total Bilirubin: 0.3 mg/dL (ref 0.3–1.2)
Total Protein: 7.2 g/dL (ref 6.5–8.1)

## 2023-07-04 LAB — LACTATE DEHYDROGENASE: LDH: 123 U/L (ref 98–192)

## 2023-07-04 MED ORDER — PANTOPRAZOLE SODIUM 40 MG PO TBEC: 40.0000 mg | DELAYED_RELEASE_TABLET | Freq: Every day | ORAL | 0 refills | Status: DC

## 2023-07-04 NOTE — Progress Notes (Signed)
[facility] CONSULT NOTE  Patient Care Team: Pardue, Monico Blitz, DO as PCP - General (Family Medicine) Michaelyn Barter, MD as Consulting Physician (Oncology)   CANCER STAGING   Cancer Staging  CLL (chronic lymphocytic leukemia) [facility]) Staging form: Chronic Lymphocytic Leukemia / Small Lymphocytic Lymphoma, AJCC 8th Edition - Clinical stage from 10/03/2022: Modified Rai Stage 0 (Modified Rai risk: Low, Lymphocytosis: Present, Adenopathy: Absent, Organomegaly: Absent, Anemia: Absent, Thrombocytopenia: Absent) - Signed by Michaelyn Barter, MD on 10/03/2022 Stage prefix: Initial diagnosis   ASSESSMENT & PLAN:  Natalie Eaton 67 y.o. female with pmh of CAD, prediabetes, hypothyroidism, hypertension, hysterectomy, TBI was referred to hematology for workup of lymphocytosis.  # CLL, Rai stage 0 -Flow cytometry done on 09/27/2022 showed involvement by CD5, CD23, CD20, CD22 positive clonal B-cell population.  She has peripheral lymphocytosis more than 5000 for more than 3 months.  Diagnostic for CLL.  -Baseline CT chest abdomen pelvis done on 10/08/2022 did not show any adenopathy or splenomegaly.  Hepatomegaly present.  Right upper lobe pneumatocele seen.  Small pulmonary nodules measuring up to 5 mm favoring infectious or inflammatory.  Small hiatal hernia.  Infraumbilical ventral hernia.  Repeat CT chest in May 2024 showed stable pulmonary nodule likely benign.  No further follow-up recommended.  -IgHV somatic hyper mutation detected.  Which is good prognostic marker.  Labs reviewed and stable.  Denies any B symptoms.  Exam is unremarkable.  Continue with surveillance.  Follow-up in 6 months for H&P and labs.    # Epigastric pain -Present for several months.  Aggravated by eating/drinking/taking medications.  Denies any nausea or vomiting. -Will do a trial of Protonix 40 mg for 3 months. -Referral to GI.  #Lung pneumatocele -Follows with Dr. Jayme Cloud.  # Arthritis -Uses left knee  brace.  Follows with orthopedic.  Orders Placed This Encounter  Procedures   CBC with Differential/Platelet    Standing Status:   Future    Standing Expiration Date:   07/03/2024   Comprehensive metabolic panel    Standing Status:   Future    Standing Expiration Date:   07/03/2024   Lactate dehydrogenase    Standing Status:   Future    Standing Expiration Date:   07/03/2024   Ambulatory referral to Gastroenterology    Standing Status:   Future    Standing Expiration Date:   07/03/2024    Referral Priority:   Routine    Referral Type:   Consultation    Referral Reason:   Specialty Services Required    Number of Visits Requested:   1   RTC in 6 months for MD visit, labs  The total time spent in the appointment was 30 minutes encounter with patients including review of chart and various tests results, discussions about plan of care and coordination of care plan   All questions were answered. The patient knows to call the clinic with any problems, questions or concerns. No barriers to learning was detected.  Michaelyn Barter, MD 10/18/202410:44 AM   HISTORY OF PRESENTING ILLNESS:  Natalie Eaton 67 y.o. female with pmh of CAD, prediabetes, hypothyroidism, hypertension, hysterectomy, TBI was referred to hematology for workup of lymphocytosis.  Further workup diagnostic of CLL.    Interval history Patient seen today as follow-up for CLL stage 0 on surveillance. Patient reports epigastric pain aggravated by eating, drinking or taking medication.  She is experienced this for several months but recently worse.  Denies any nausea, vomiting.  Denies any B  symptoms.  She does feel hot but denies any drenching night sweats.  Reports feeling fatigue which is chronic.  I have reviewed her chart and materials related to her cancer extensively and collaborated history with the patient. Summary of oncologic history is as follows: Oncology History  CLL (chronic lymphocytic leukemia) (HCC)   10/03/2022 Initial Diagnosis   CLL (chronic lymphocytic leukemia) (HCC)   10/03/2022 Cancer Staging   Staging form: Chronic Lymphocytic Leukemia / Small Lymphocytic Lymphoma, AJCC 8th Edition - Clinical stage from 10/03/2022: Modified Rai Stage 0 (Modified Rai risk: Low, Lymphocytosis: Present, Adenopathy: Absent, Organomegaly: Absent, Anemia: Absent, Thrombocytopenia: Absent) - Signed by Michaelyn Barter, MD on 10/03/2022 Stage prefix: Initial diagnosis     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, per gyn   Hypertension    Hypothyroidism    Insomnia    Leukemia (HCC)    MVP (mitral valve prolapse)    Pneumonia    Pre-diabetes     SURGICAL HISTORY: Past Surgical History:  Procedure Laterality Date   ABDOMINAL HYSTERECTOMY     BLADDER SURGERY     CARDIAC CATHETERIZATION     GALLBLADDER SURGERY     LUNG REMOVAL, PARTIAL     ROTATOR CUFF REPAIR     TONSILLECTOMY     TUBAL LIGATION      SOCIAL HISTORY: Social History   Socioeconomic History   Marital status: Widowed    Spouse name: Not on file   Number of children: 2   Years of education: Not on file   Highest education level: Not on file  Occupational History   Not on file  Tobacco Use   Smoking status: Former    Current packs/day: 0.00    Average packs/day: 0.3 packs/day for 15.0 years (3.8 ttl pk-yrs)    Types: Cigarettes    Start date: 89    Quit date: 62    Years since quitting: 28.8   Smokeless tobacco: Never  Vaping Use   Vaping status: Never Used  Substance and Sexual Activity   Alcohol use: Yes    Alcohol/week: 1.0 standard drink of alcohol    Types: 1 Glasses of wine per week   Drug use: No   Sexual activity: Not Currently    Birth control/protection:  None  Other Topics Concern   Not on file  Social History Narrative   Not on file   Social Determinants of Health   Financial Resource Strain: Low Risk  (02/25/2023)   Overall Financial Resource Strain (CARDIA)    Difficulty of Paying Living Expenses: Not very hard  Food Insecurity: No Food Insecurity (02/25/2023)   Hunger Vital Sign    Worried About Running Out of Food in the Last Year: Never true    Ran Out of Food in the Last Year: Never true  Transportation Needs: No Transportation Needs (02/25/2023)   PRAPARE - Administrator, Civil Service (Medical): No    Lack of Transportation (Non-Medical): No  Physical Activity: Sufficiently Active (02/25/2023)   Exercise Vital Sign    Days of Exercise per Week: 5 days    Minutes of Exercise per Session: 30 min  Stress: No  Stress Concern Present (02/25/2023)   [employer] of Occupational Health - Occupational Stress Questionnaire    Feeling of Stress : Not at all  Social Connections: Moderately Isolated (02/25/2023)   Social Connection and Isolation Panel [NHANES]    Frequency of Communication with Friends and Family: More than three times a week    Frequency of Social Gatherings with Friends and Family: Never    Attends Religious Services: More than 4 times per year    Active Member of [employer] or Organizations: No    Attends Banker Meetings: Never    Marital Status: Widowed  Intimate Partner Violence: Not At Risk (02/25/2023)   Humiliation, Afraid, Rape, and Kick questionnaire    Fear of Current or Ex-Partner: No    Emotionally Abused: No    Physically Abused: No    Sexually Abused: No    FAMILY HISTORY: Family History  Problem Relation Age of Onset   Healthy Mother    Scoliosis Mother    Heart disease Father    Heart disease Brother    Healthy Daughter    Healthy Son     ALLERGIES:  is allergic to codeine.  MEDICATIONS:  Current Outpatient Medications  Medication Sig Dispense Refill   aspirin  81 MG chewable tablet Chew 81 mg by mouth daily.     celecoxib (CELEBREX) 200 MG capsule TAKE 1 CAPSULE BY MOUTH TWICE A DAY 120 capsule 0   gabapentin (NEURONTIN) 300 MG capsule TAKE 1 CAPSULE BY MOUTH THREE TIMES A DAY 270 capsule 0   hydrochlorothiazide (HYDRODIURIL) 25 MG tablet Take 1 tablet (25 mg total) by mouth daily. 90 tablet 3   levothyroxine (SYNTHROID) 75 MCG tablet TAKE 1 TABLET BY MOUTH EVERY DAY 90 tablet 0   methocarbamol (ROBAXIN) 750 MG tablet Take 1 tablet (750 mg total) by mouth 2 (two) times daily as needed for muscle spasms. 30 tablet 3   Multiple Vitamin (MULTIVITAMIN ADULT PO) Take 1 each by mouth daily at 2 PM.     potassium chloride (MICRO-K) 10 MEQ CR capsule Take 1 capsule (10 mEq total) by mouth 2 (two) times daily. 180 capsule 1   rosuvastatin (CRESTOR) 10 MG tablet Take 1 tablet (10 mg total) by mouth daily. 30 tablet 6   traMADol (ULTRAM) 50 MG tablet Take 50 mg by mouth 3 (three) times daily.     traZODone (DESYREL) 100 MG tablet Take 100 mg by mouth at bedtime.     valACYclovir (VALTREX) 500 MG tablet Take 1 tablet (500 mg total) by mouth daily. Or BID for 3 days prn sx 90 tablet 2   pantoprazole (PROTONIX) 40 MG tablet Take 1 tablet (40 mg total) by mouth daily. 90 tablet 0   No current facility-administered medications for this visit.    REVIEW OF SYSTEMS:   Pertinent information mentioned in HPI All other systems were reviewed with the patient and are negative.  PHYSICAL EXAMINATION: ECOG PERFORMANCE STATUS: 1 - Symptomatic but completely ambulatory  Vitals:   07/04/23 1017  BP: 115/78  Pulse: 88  Temp: 98.7 F (37.1 C)  SpO2: 99%    Filed Weights   07/04/23 1017  Weight: 194 lb (88 kg)     GENERAL:alert, no distress and comfortable SKIN: skin color, texture, turgor are normal, no rashes or significant lesions EYES: normal, conjunctiva are pink and non-injected, sclera clear OROPHARYNX:no exudate, no erythema and lips, buccal mucosa, and  tongue normal  NECK: supple, thyroid normal size, non-tender, without nodularity  LYMPH:  no palpable lymphadenopathy in the cervical, axillary or inguinal LUNGS: clear to auscultation and percussion with normal breathing effort HEART: regular rate & rhythm and no murmurs and no lower extremity edema ABDOMEN:abdomen soft, non-tender and normal bowel sounds Musculoskeletal:no cyanosis of digits and no clubbing  PSYCH: alert & oriented x 3 with fluent speech NEURO: no focal motor/sensory deficits  LABORATORY DATA:  I have reviewed the data as listed Lab Results  Component Value Date   WBC 11.8 (H) 07/04/2023   HGB 13.8 07/04/2023   HCT 42.3 07/04/2023   MCV 95.9 07/04/2023   PLT 236 07/04/2023   Recent Labs    10/08/22 1125 01/02/23 1040 07/04/23 0954  NA  --  138 137  K  --  4.2 3.7  CL  --  104 102  CO2  --  26 26  GLUCOSE  --  98 103*  BUN  --  26* 38*  CREATININE 0.90 0.73 0.72  CALCIUM  --  9.3 9.4  GFRNONAA  --  >60 >60  PROT  --  6.8 7.2  ALBUMIN  --  4.3 4.3  AST  --  18 15  ALT  --  20 17  ALKPHOS  --  53 62  BILITOT  --  0.3 0.3    RADIOGRAPHIC STUDIES: I have personally reviewed the radiological images as listed and agreed with the findings in the report. No results found.

## 2023-07-04 NOTE — Progress Notes (Signed)
Patient says that about 4 months ago a feeling like someone is punching her in the gut, which she notices it more when she drinks or takes medication. She is wanting to start taking a multi vitamin to maybe help with her feeling so fatigue. She is wanting to know if she can get her vitamin B12 checked to see if that is what it is.

## 2023-07-16 ENCOUNTER — Encounter: Payer: Self-pay | Admitting: Obstetrics and Gynecology

## 2023-07-17 ENCOUNTER — Telehealth: Payer: Self-pay | Admitting: *Deleted

## 2023-07-17 NOTE — Telephone Encounter (Signed)
Call returned to patient and I got message that her voice mail is full so I could not leave a message.

## 2023-07-17 NOTE — Telephone Encounter (Signed)
I called and spoke with patient and informed her of doctor response. She thanked me for lettingher know

## 2023-07-17 NOTE — Telephone Encounter (Signed)
Patient called reporting that her dentist wants to remove her dental implants due to bone loss. She is asking if her CLL could have caused her bone loss so rapidly to the ponit that in 5 years she has to have her dental implants removed. Please return her call

## 2023-07-29 ENCOUNTER — Other Ambulatory Visit (HOSPITAL_COMMUNITY): Payer: Self-pay

## 2023-08-03 ENCOUNTER — Other Ambulatory Visit: Payer: Self-pay | Admitting: Family Medicine

## 2023-08-03 DIAGNOSIS — E039 Hypothyroidism, unspecified: Secondary | ICD-10-CM

## 2023-08-04 NOTE — Telephone Encounter (Signed)
Requested Prescriptions  Pending Prescriptions Disp Refills   levothyroxine (SYNTHROID) 75 MCG tablet [Pharmacy Med Name: LEVOTHYROXINE 75 MCG TABLET] 90 tablet 1    Sig: TAKE 1 TABLET BY MOUTH EVERY DAY     Endocrinology:  Hypothyroid Agents Passed - 08/03/2023  9:10 AM      Passed - TSH in normal range and within 360 days    TSH  Date Value Ref Range Status  04/02/2023 0.475 0.450 - 4.500 uIU/mL Final         Passed - Valid encounter within last 12 months    Recent Outpatient Visits           4 months ago Pure hypercholesterolemia   [facility] Alfredia Ferguson, PA-C   9 months ago Pneumatocele of lung   [facility] [place], [place], PA-C   10 months ago Flank pain   [facility] Merita Norton T, FNP   10 months ago Hypothyroidism, unspecified type   [facility] Alfredia Ferguson, PA-C   2 years ago Depression, unspecified depression type   [facility], Asencion Partridge, MD

## 2023-08-28 ENCOUNTER — Ambulatory Visit: Payer: Medicare Other | Admitting: Gastroenterology

## 2023-09-04 ENCOUNTER — Encounter: Payer: Self-pay | Admitting: Pulmonary Disease

## 2023-09-04 ENCOUNTER — Ambulatory Visit: Payer: Medicare Other | Admitting: Pulmonary Disease

## 2023-09-04 VITALS — BP 122/82 | HR 76 | Temp 97.3°F | Ht 67.0 in | Wt 199.2 lb

## 2023-09-04 DIAGNOSIS — C9110 Chronic lymphocytic leukemia of B-cell type not having achieved remission: Secondary | ICD-10-CM | POA: Diagnosis not present

## 2023-09-04 DIAGNOSIS — J4520 Mild intermittent asthma, uncomplicated: Secondary | ICD-10-CM

## 2023-09-04 DIAGNOSIS — J984 Other disorders of lung: Secondary | ICD-10-CM

## 2023-09-04 DIAGNOSIS — J449 Chronic obstructive pulmonary disease, unspecified: Secondary | ICD-10-CM

## 2023-09-04 LAB — NITRIC OXIDE: Nitric Oxide: 20

## 2023-09-04 MED ORDER — AIRSUPRA 90-80 MCG/ACT IN AERO: 2.0000 | INHALATION_SPRAY | RESPIRATORY_TRACT | Status: DC | PRN

## 2023-09-04 NOTE — Progress Notes (Signed)
Subjective:    Patient ID: Natalie Eaton, female    DOB: 12-02-1955, 67 y.o.   MRN: [id number]  Patient Care Team: Sherlyn Hay, DO as PCP - General (Family Medicine) Michaelyn Barter, MD as Consulting Physician (Oncology)  Chief Complaint  Patient presents with   Follow-up    "Heavy breathing" since oral surgery on 07/29/2023.Dry cough. No wheezing or SOB.    BACKGROUND/INTERVAL: 67 year old female, former smoker. PMH hypertension, pneumatocele of lung, hepatomegaly, hepatic steatosis, hypothyroidism, CLL.   HPI Discussed the use of AI scribe software for clinical note transcription with the patient, who gave verbal consent to proceed.  History of Present Illness   The patient, with a history of asthma, presents with a sensation of heaviness in the chest and a dry cough, which she reports has been ongoing since a recent oral surgery. The surgery was performed to remove dental implants due to bone loss. The patient also reports a sensation of pills getting stuck in her throat. She denies any wheezing associated with the chest heaviness.  In addition to these symptoms, the patient has been experiencing issues with her left knee, which has been diagnosed as a deformity causing the leg to deviate to the side. She has been receiving injections for this issue and is wearing a brace to delay the need for surgery.  The patient lives in an old house, over a hundred years old, and has concerns about potential dust or mold exposure affecting her respiratory symptoms. She notes that her pets have also had respiratory issues since moving into this house. The patient has been using electric heaters for warmth, which she acknowledges may be contributing to dust accumulation due to dryness of ambient air.  The patient had previously been prescribed a rescue inhaler her asthma; however, she reports not being able to afford the copay for this medication and she never got the medication. She expresses a  desire to try a rescue inhaler to see if it alleviates the sensation of chest heaviness.  Wants to avoid maintenance inhaler due to cost.    DATA 01/20/2023 CT chest : Right upper lobe scarring/architectural distortion with central thin-walled cavitary lesion extending to the hilum, measuring 4.7 x 3.5 cm, grossly unchanged. No further follow up needed.  02/07/2023 PFTs: FEV1 2.09 L or 78% predicted, FVC 2.71 L or 77% predicted, FEV1/FVC 77%, there is bronchodilator response with regards to airway resistance.  Lung volumes normal ERV 1% consistent with obesity.  Diffusion capacity normal.  Consistent with a minimal/mild obstructive airways disease with reversible component (asthmatic type).  Review of Systems A 10 point review of systems was performed and it is as noted above otherwise negative.   Patient Active Problem List   Diagnosis Date Noted   Obstructive airway disease (HCC) 02/17/2023   Upper airway cough syndrome 02/17/2023   Herpes simplex vulvovaginitis 12/10/2022   Hepatic steatosis 10/22/2022   Left upper quadrant abdominal pain 10/15/2022   Hepatomegaly 10/15/2022   Pneumatocele of lung 10/15/2022   Ventral hernia without obstruction or gangrene 10/15/2022   Flank pain 10/07/2022   CLL (chronic lymphocytic leukemia) (HCC) 10/03/2022   Epigastric pain 09/20/2022   Chronic back pain 09/20/2022   Prediabetes 07/11/2022   Arthritis 06/10/2019   MVP (mitral valve prolapse) 06/10/2019   Statin intolerance 02/17/2019   Pure hypercholesterolemia 11/16/2018   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   Obesity (BMI 30-39.9) 02/24/2018   Essential hypertension, benign 11/21/2017   Hypothyroidism 11/21/2017  Social History   Tobacco Use   Smoking status: Former    Current packs/day: 0.00    Average packs/day: 0.3 packs/day for 15.0 years (3.8 ttl pk-yrs)    Types: Cigarettes    Start date: 58    Quit date: 1996    Years since quitting: 28.9   Smokeless  tobacco: Never  Substance Use Topics   Alcohol use: Yes    Alcohol/week: 1.0 standard drink of alcohol    Types: 1 Glasses of wine per week    Allergies  Allergen Reactions   Codeine Other (See Comments)    Tingling on her scalp.    Current Meds  Medication Sig   Albuterol-Budesonide (AIRSUPRA) 90-80 MCG/ACT AERO Inhale 2 puffs into the lungs every 4 (four) hours as needed.   aspirin 81 MG chewable tablet Chew 81 mg by mouth daily.   celecoxib (CELEBREX) 200 MG capsule TAKE 1 CAPSULE BY MOUTH TWICE A DAY   gabapentin (NEURONTIN) 300 MG capsule TAKE 1 CAPSULE BY MOUTH THREE TIMES A DAY   hydrochlorothiazide (HYDRODIURIL) 25 MG tablet Take 1 tablet (25 mg total) by mouth daily.   levothyroxine (SYNTHROID) 75 MCG tablet TAKE 1 TABLET BY MOUTH EVERY DAY   methocarbamol (ROBAXIN) 750 MG tablet Take 1 tablet (750 mg total) by mouth 2 (two) times daily as needed for muscle spasms.   Multiple Vitamin (MULTIVITAMIN ADULT PO) Take 1 each by mouth daily at 2 PM.   pantoprazole (PROTONIX) 40 MG tablet Take 1 tablet (40 mg total) by mouth daily.   rosuvastatin (CRESTOR) 10 MG tablet Take 1 tablet (10 mg total) by mouth daily.   traMADol (ULTRAM) 50 MG tablet Take 50 mg by mouth 3 (three) times daily.   traZODone (DESYREL) 100 MG tablet Take 100 mg by mouth at bedtime.   valACYclovir (VALTREX) 500 MG tablet Take 1 tablet (500 mg total) by mouth daily. Or BID for 3 days prn sx    Immunization History  Administered Date(s) Administered   Influenza,inj,Quad PF,6+ Mos 08/21/2018, 06/10/2019   PFIZER(Purple Top)SARS-COV-2 Vaccination 12/09/2019, 01/01/2020, 07/04/2020   Pneumococcal Conjugate-13 06/02/2020   Pneumococcal-Unspecified 09/17/2019   Tdap 09/16/2018, 08/11/2020, 02/02/2022   Unspecified SARS-COV-2 Vaccination 12/14/2020        Objective:   BP 122/82 (BP Location: Right Arm, Cuff Size: Normal)   Pulse 76   Temp (!) 97.3 F (36.3 C)   Ht 5\' 7"  (1.702 m)   Wt 199 lb 3.2 oz  (90.4 kg)   SpO2 98%   BMI 31.20 kg/m   SpO2: 98 % O2 Device: None (Room air)  GENERAL: Overweight, well-developed woman, no acute distress.  Fully ambulatory.  No conversational dyspnea. HEAD: Normocephalic, atraumatic.  EYES: Pupils equal, round, reactive to light.  No scleral icterus.  MOUTH: Dentition intact, oral mucosa moist.  No thrush. NECK: Supple. No thyromegaly. Trachea midline. No JVD.  No adenopathy. PULMONARY: Good air entry bilaterally.  No adventitious sounds. CARDIOVASCULAR: S1 and S2. Regular rate and rhythm.  No rubs, murmurs or gallops heard. ABDOMEN: Mildly protuberant, otherwise benign. MUSCULOSKELETAL: No joint deformity, no clubbing, no edema.  NEUROLOGIC: No overt focal deficit, no gait disturbance, speech is fluent. SKIN: Intact,warm,dry. PSYCH: Mood and behavior normal.  Lab Results  Component Value Date   NITRICOXIDE 20 09/04/2023    Assessment & Plan:     ICD-10-CM   1. Mild intermittent asthma without complication  J45.20 Nitric oxide   Trial of AirSupra, sample provided    2. Pneumatocele  of lung  J98.4    No further imaging/workup needed    3. CLL (chronic lymphocytic leukemia) (HCC)  C91.10    This issue adds complexity to her management      Orders Placed This Encounter  Procedures   Nitric oxide    Meds ordered this encounter  Medications   Albuterol-Budesonide (AIRSUPRA) 90-80 MCG/ACT AERO    Sig: Inhale 2 puffs into the lungs every 4 (four) hours as needed.    Lot Number?:   [id number]    Expiration Date?:   03/16/2024    NDC:   [phone] [[id number]]   Assessment and Plan    Asthma   Reports chest heaviness and dry cough since recent oral surgery. Asthma confirmed by spirometry in May. Physical exam reveals clear lungs. Symptoms may be exacerbated by environmental factors in her old house, potentially containing dust or mold. Not using a rescue inhaler due to cost. Discussed benefits of rescue inhaler and importance of  monitoring environmental triggers. Advised that moving may help if environmental factors contribute.   - Trial of Airsupra rescue inhaler - sample provided to patient - If uses AirSupra daily need to consider maintenance inhaler - Monitor symptoms and consider moving if symptoms persist    Post-Operative Throat Discomfort   Reports sensation of pills getting stuck in throat since recent oral surgery. Throat examination is clear. Advised to follow up with oral surgeon if symptoms persist. Discussed potential post-surgery changes and importance of ruling out underlying issues.   - Follow up with oral surgeon if throat discomfort persists    General Health Maintenance   Receives knee injections for L knee pain and wears a brace to delay surgery.  - Recommend thyroid function test if symptoms suggestive of thyroid dysfunction arise   - Continue knee injections and brace use as advised by orthopedic specialist    Follow-up   - Schedule follow-up visit in three months.      Advised if symptoms do not improve or worsen, to please contact office for sooner follow up or seek emergency care.    I spent 30 minutes of dedicated to the care of this patient on the date of this encounter to include pre-visit review of records, face-to-face time with the patient discussing conditions above, post visit ordering of testing, clinical documentation with the electronic health record, making appropriate referrals as documented, and communicating necessary findings to members of the patients care team.     C. Danice Goltz, MD Advanced Bronchoscopy PCCM [facility]    *This note was generated using voice recognition software/Dragon and/or AI transcription program.  Despite best efforts to proofread, errors can occur which can change the meaning. Any transcriptional errors that result from this process are unintentional and may not be fully corrected at the time of dictation.

## 2023-09-04 NOTE — Patient Instructions (Addendum)
VISIT SUMMARY:  During today's visit, we discussed your ongoing chest heaviness and dry cough, which started after your recent oral surgery. We also addressed your knee issues and concerns about potential environmental factors in your home affecting your respiratory symptoms. Additionally, we talked about the sensation of pills getting stuck in your throat post-surgery.  YOUR PLAN:  -ASTHMA: Asthma is a condition where your airways narrow and swell, producing extra mucus, which can make breathing difficult. You reported chest heaviness and a dry cough, possibly worsened by environmental factors in your home. We recommend trying an Airsupra rescue inhaler and monitoring your symptoms. If symptoms persist, consider moving to a different environment to see if it helps.  AirSupra is 2 puffs 4-5 times a day as needed for shortness of breath, wheezing or cough.  Let us know if you have to use this on a daily basis.  We have provided you with a sample if this is helpful then we may need to consider a maintenance inhaler.  -POST-OPERATIVE THROAT DISCOMFORT: Post-operative throat discomfort can occur after surgery and may cause a sensation of pills getting stuck in your throat. Your throat examination was clear, but if the discomfort continues, please follow up with your oral surgeon to rule out any underlying issues.  -GENERAL HEALTH MAINTENANCE: We discussed the importance of routine thyroid function tests if you experience symptoms suggestive of thyroid dysfunction.  These will be followed by your primary care physician Dr. Payton Mccallum.  Continue with your current knee injections and brace use as advised by your orthopedic specialist.  INSTRUCTIONS:  Please schedule a follow-up visit in three months. If your throat discomfort persists, follow up with your oral surgeon. Monitor your asthma symptoms and consider moving if environmental factors in your home are contributing to your symptoms.

## 2023-09-06 ENCOUNTER — Other Ambulatory Visit: Payer: Self-pay | Admitting: Nurse Practitioner

## 2023-09-06 ENCOUNTER — Other Ambulatory Visit: Payer: Self-pay | Admitting: Family Medicine

## 2023-09-06 DIAGNOSIS — I10 Essential (primary) hypertension: Secondary | ICD-10-CM

## 2023-09-06 DIAGNOSIS — G8929 Other chronic pain: Secondary | ICD-10-CM

## 2023-09-08 ENCOUNTER — Ambulatory Visit (INDEPENDENT_AMBULATORY_CARE_PROVIDER_SITE_OTHER): Payer: Medicare Other | Admitting: Family Medicine

## 2023-09-08 VITALS — BP 132/86 | HR 80 | Ht 67.0 in | Wt 198.0 lb

## 2023-09-08 DIAGNOSIS — Z780 Asymptomatic menopausal state: Secondary | ICD-10-CM

## 2023-09-08 DIAGNOSIS — I10 Essential (primary) hypertension: Secondary | ICD-10-CM | POA: Diagnosis not present

## 2023-09-08 DIAGNOSIS — E7800 Pure hypercholesterolemia, unspecified: Secondary | ICD-10-CM

## 2023-09-08 DIAGNOSIS — Z1231 Encounter for screening mammogram for malignant neoplasm of breast: Secondary | ICD-10-CM | POA: Insufficient documentation

## 2023-09-08 DIAGNOSIS — R7303 Prediabetes: Secondary | ICD-10-CM

## 2023-09-08 DIAGNOSIS — E039 Hypothyroidism, unspecified: Secondary | ICD-10-CM

## 2023-09-08 DIAGNOSIS — E669 Obesity, unspecified: Secondary | ICD-10-CM

## 2023-09-08 DIAGNOSIS — Z6830 Body mass index (BMI) 30.0-30.9, adult: Secondary | ICD-10-CM

## 2023-09-08 MED ORDER — HYDROCHLOROTHIAZIDE 25 MG PO TABS: 25.0000 mg | ORAL_TABLET | Freq: Every day | ORAL | 11 refills | Status: DC

## 2023-09-08 MED ORDER — ROSUVASTATIN CALCIUM 40 MG PO TABS: 40.0000 mg | ORAL_TABLET | Freq: Every day | ORAL | 11 refills | Status: DC

## 2023-09-08 NOTE — Patient Instructions (Signed)
Please call and schedule your mammogram and bone screening:  [facility]  [address], [address] [facility] [place],  [place]  [address] Main: [phone]  Sunday:Closed Monday:7:20 AM - 5:00 PM Tuesday:7:20 AM - 5:00 PM Wednesday:7:20 AM - 5:00 PM Thursday:7:20 AM - 5:00 PM Friday:7:20 AM - 4:30 PM Saturday:Closed

## 2023-09-08 NOTE — Progress Notes (Signed)
Established patient visit   Patient: Natalie Eaton   DOB: 02/02/1956   67 y.o. Female  MRN: [id number] Visit Date: 09/08/2023  Today's healthcare provider: Jacky Kindle, FNP  Introduced to nurse practitioner role and practice setting.  All questions answered.  Discussed provider/patient relationship and expectations.  Chief Complaint  Patient presents with   Follow-up   Subjective    HPI HPI   Medication refill. Last edited by Shelly Bombard, CMA on 09/08/2023 11:07 AM.     Patient presents for medication refills; unknown to provider. Previously under care of [facility] and plans to follow with [facility] for further care needs. Reports she was in the area and was advised she needed an OV for med refills.  Medications: Outpatient Medications Prior to Visit  Medication Sig   Albuterol-Budesonide (AIRSUPRA) 90-80 MCG/ACT AERO Inhale 2 puffs into the lungs every 4 (four) hours as needed.   aspirin 81 MG chewable tablet Chew 81 mg by mouth daily.   celecoxib (CELEBREX) 200 MG capsule TAKE 1 CAPSULE BY MOUTH TWICE A DAY   gabapentin (NEURONTIN) 300 MG capsule TAKE 1 CAPSULE BY MOUTH THREE TIMES A DAY   levothyroxine (SYNTHROID) 75 MCG tablet TAKE 1 TABLET BY MOUTH EVERY DAY   methocarbamol (ROBAXIN) 750 MG tablet Take 1 tablet (750 mg total) by mouth 2 (two) times daily as needed for muscle spasms.   Multiple Vitamin (MULTIVITAMIN ADULT PO) Take 1 each by mouth daily at 2 PM.   pantoprazole (PROTONIX) 40 MG tablet Take 1 tablet (40 mg total) by mouth daily.   potassium chloride (MICRO-K) 10 MEQ CR capsule Take 1 capsule (10 mEq total) by mouth 2 (two) times daily.   traMADol (ULTRAM) 50 MG tablet Take 50 mg by mouth 3 (three) times daily.   traZODone (DESYREL) 100 MG tablet Take 100 mg by mouth at bedtime.   valACYclovir (VALTREX) 500 MG tablet Take 1 tablet (500 mg total) by mouth daily. Or BID for 3 days prn sx   [DISCONTINUED] hydrochlorothiazide (HYDRODIURIL) 25 MG tablet Take 1  tablet (25 mg total) by mouth daily.   [DISCONTINUED] rosuvastatin (CRESTOR) 10 MG tablet Take 1 tablet (10 mg total) by mouth daily.   No facility-administered medications prior to visit.   Last CBC Lab Results  Component Value Date   WBC 14.6 (H) 09/11/2023   HGB 14.4 09/11/2023   HCT 43.5 09/11/2023   MCV 95 09/11/2023   MCH 31.4 09/11/2023   RDW 12.6 09/11/2023   PLT 245 09/11/2023   Last metabolic panel Lab Results  Component Value Date   GLUCOSE 106 (H) 09/11/2023   NA 143 09/11/2023   K 4.8 09/11/2023   CL 102 09/11/2023   CO2 25 09/11/2023   BUN 19 09/11/2023   CREATININE 0.83 09/11/2023   EGFR 77 09/11/2023   CALCIUM 10.3 09/11/2023   PROT 6.8 09/11/2023   ALBUMIN 5.0 (H) 09/11/2023   LABGLOB 1.8 09/11/2023   AGRATIO 1.8 09/01/2020   BILITOT 0.4 09/11/2023   ALKPHOS 73 09/11/2023   AST 19 09/11/2023   ALT 25 09/11/2023   ANIONGAP 9 07/04/2023   Last lipids Lab Results  Component Value Date   CHOL 169 09/11/2023   HDL 53 09/11/2023   LDLCALC 92 09/11/2023   TRIG 137 09/11/2023   CHOLHDL 3.2 09/11/2023   Last hemoglobin A1c Lab Results  Component Value Date   HGBA1C 6.1 (H) 09/11/2023   Last thyroid functions Lab Results  Component Value Date   TSH 0.992 09/11/2023   Last vitamin D Lab Results  Component Value Date   VD25OH 37.8 09/11/2023   Last vitamin B12 and Folate No results found for: "VITAMINB12", "FOLATE"    Objective    BP 132/86 (BP Location: Left Arm, Patient Position: Sitting, Cuff Size: Large)   Pulse 80   Ht 5\' 7"  (1.702 m)   Wt 198 lb (89.8 kg)   SpO2 97%   BMI 31.01 kg/m  BP Readings from Last 3 Encounters:  09/08/23 132/86  09/04/23 122/82  07/04/23 115/78   Wt Readings from Last 3 Encounters:  09/08/23 198 lb (89.8 kg)  09/04/23 199 lb 3.2 oz (90.4 kg)  07/04/23 194 lb (88 kg)   SpO2 Readings from Last 3 Encounters:  09/08/23 97%  09/04/23 98%  07/04/23 99%   Physical Exam Vitals and nursing note  reviewed.  Constitutional:      General: She is not in acute distress.    Appearance: Normal appearance. She is obese. She is not ill-appearing, toxic-appearing or diaphoretic.  HENT:     Head: Normocephalic and atraumatic.  Cardiovascular:     Rate and Rhythm: Normal rate and regular rhythm.     Pulses: Normal pulses.     Heart sounds: Normal heart sounds. No murmur heard.    No friction rub. No gallop.  Pulmonary:     Effort: Pulmonary effort is normal. No respiratory distress.     Breath sounds: Normal breath sounds. No stridor. No wheezing, rhonchi or rales.  Chest:     Chest wall: No tenderness.  Musculoskeletal:        General: No swelling, tenderness, deformity or signs of injury. Normal range of motion.     Right lower leg: No edema.     Left lower leg: No edema.  Skin:    General: Skin is warm and dry.     Capillary Refill: Capillary refill takes less than 2 seconds.     Coloration: Skin is not jaundiced or pale.     Findings: No bruising, erythema, lesion or rash.  Neurological:     General: No focal deficit present.     Mental Status: She is alert and oriented to person, place, and time. Mental status is at baseline.     Cranial Nerves: No cranial nerve deficit.     Sensory: No sensory deficit.     Motor: No weakness.     Coordination: Coordination normal.  Psychiatric:        Mood and Affect: Mood normal.        Behavior: Behavior normal.        Thought Content: Thought content normal.        Judgment: Judgment normal.     Results for orders placed or performed in visit on 09/08/23  CBC with Differential/Platelet  Result Value Ref Range   WBC 14.6 (H) 3.4 - 10.8 x10E3/uL   RBC 4.59 3.77 - 5.28 x10E6/uL   Hemoglobin 14.4 11.1 - 15.9 g/dL   Hematocrit 40.9 81.1 - 46.6 %   MCV 95 79 - 97 fL   MCH 31.4 26.6 - 33.0 pg   MCHC 33.1 31.5 - 35.7 g/dL   RDW 91.4 78.2 - 95.6 %   Platelets 245 150 - 450 x10E3/uL   Neutrophils 33 Not Estab. %   Lymphs 61 Not Estab.  %   Monocytes 4 Not Estab. %   Eos 2 Not Estab. %   Basos 0  Not Estab. %   Neutrophils Absolute 4.8 1.4 - 7.0 x10E3/uL   Lymphocytes Absolute 8.9 (H) 0.7 - 3.1 x10E3/uL   Monocytes Absolute 0.6 0.1 - 0.9 x10E3/uL   EOS (ABSOLUTE) 0.3 0.0 - 0.4 x10E3/uL   Basophils Absolute 0.1 0.0 - 0.2 x10E3/uL   Immature Granulocytes 0 Not Estab. %   Immature Grans (Abs) 0.0 0.0 - 0.1 x10E3/uL  Comprehensive Metabolic Panel (CMET)  Result Value Ref Range   Glucose 106 (H) 70 - 99 mg/dL   BUN 19 8 - 27 mg/dL   Creatinine, Ser 1.61 0.57 - 1.00 mg/dL   eGFR 77 >09 UE/AVW/0.98   BUN/Creatinine Ratio 23 12 - 28   Sodium 143 134 - 144 mmol/L   Potassium 4.8 3.5 - 5.2 mmol/L   Chloride 102 96 - 106 mmol/L   CO2 25 20 - 29 mmol/L   Calcium 10.3 8.7 - 10.3 mg/dL   Total Protein 6.8 6.0 - 8.5 g/dL   Albumin 5.0 (H) 3.9 - 4.9 g/dL   Globulin, Total 1.8 1.5 - 4.5 g/dL   Bilirubin Total 0.4 0.0 - 1.2 mg/dL   Alkaline Phosphatase 73 44 - 121 IU/L   AST 19 0 - 40 IU/L   ALT 25 0 - 32 IU/L  TSH + free T4  Result Value Ref Range   TSH 0.992 0.450 - 4.500 uIU/mL   Free T4 1.34 0.82 - 1.77 ng/dL  Hemoglobin J1B  Result Value Ref Range   Hgb A1c MFr Bld 6.1 (H) 4.8 - 5.6 %   Est. average glucose Bld gHb Est-mCnc 128 mg/dL  Urine Microalbumin w/creat. ratio  Result Value Ref Range   Creatinine, Urine 115.2 Not Estab. mg/dL   Microalbumin, Urine 4.0 Not Estab. ug/mL   Microalb/Creat Ratio 3 0 - 29 mg/g creat  Lipid panel  Result Value Ref Range   Cholesterol, Total 169 100 - 199 mg/dL   Triglycerides 147 0 - 149 mg/dL   HDL 53 >82 mg/dL   VLDL Cholesterol Cal 24 5 - 40 mg/dL   LDL Chol Calc (NIH) 92 0 - 99 mg/dL   Chol/HDL Ratio 3.2 0.0 - 4.4 ratio  Lipoprotein A (LPA)  Result Value Ref Range   Lipoprotein (a) <8.4 <75.0 nmol/L  Vitamin D (25 hydroxy)  Result Value Ref Range   Vit D, 25-Hydroxy 37.8 30.0 - 100.0 ng/mL    Assessment & Plan     Problem List Items Addressed This Visit        Cardiovascular and Mediastinum   Essential hypertension, benign - Primary   Chronic, remains elevated Goal remains 119/79 At this time pt is on hydrochlorothiazide at 25 mg daily Consider further addition of ACE/ARB/CCB to further assist      Relevant Medications   hydrochlorothiazide (HYDRODIURIL) 25 MG tablet   rosuvastatin (CRESTOR) 40 MG tablet   Other Relevant Orders   CBC with Differential/Platelet (Completed)   Comprehensive Metabolic Panel (CMET) (Completed)     Endocrine   Hypothyroidism   Chronic, previously stable On levothyroid at 75 mcg daily Ongoing external stressors; recommend repeat labs to assist      Relevant Orders   TSH + free T4 (Completed)     Other   Body mass index (BMI) 30.0-30.9, adult   Chronic, Body mass index is 31.01 kg/m. Discussed importance of healthy weight management Discussed diet and exercise       Relevant Orders   Vitamin D (25 hydroxy) (Completed)   Post-menopausal  Recommend Vit D to assist bone health as pt is now post menopausal       Relevant Orders   DG Bone Density   Vitamin D (25 hydroxy) (Completed)   Prediabetes   Chronic, unknown Repeat A1c Goal remains to prevent/slow progression to T2DM Previously controlled with diet/exercise      Relevant Orders   Hemoglobin A1c (Completed)   Urine Microalbumin w/creat. ratio (Completed)   Pure hypercholesterolemia   Chronic, The 10-year ASCVD risk score (Arnett DK, et al., 2019) is: 10.3% LDL goal remains <100 Continues on low dose ASA at 81 mg daily as well as recommend use of crestor 40 to assist I continue to recommend diet low in saturated fat and regular exercise - 30 min at least 5 times per week Repeat LP to further assist      Relevant Medications   hydrochlorothiazide (HYDRODIURIL) 25 MG tablet   rosuvastatin (CRESTOR) 40 MG tablet   Other Relevant Orders   Lipid panel (Completed)   Lipoprotein A (LPA) (Completed)   Screening mammogram for breast  cancer   Due for screening for mammogram, denies breast concerns, provided with phone number to call and schedule appointment for mammogram. Encouraged to repeat breast cancer screening every 1-2 years.       Relevant Orders   MM 3D SCREENING MAMMOGRAM BILATERAL BREAST   No follow-ups on file.     Leilani Merl, FNP, have reviewed all documentation for this visit. The documentation on 09/13/23 for the exam, diagnosis, procedures, and orders are all accurate and complete.  Jacky Kindle, FNP  [facility] [phone] (phone) [phone] (fax)  [facility]

## 2023-09-12 LAB — CBC WITH DIFFERENTIAL/PLATELET
Basophils Absolute: 0.1 10*3/uL (ref 0.0–0.2)
Basos: 0 %
EOS (ABSOLUTE): 0.3 10*3/uL (ref 0.0–0.4)
Eos: 2 %
Hematocrit: 43.5 % (ref 34.0–46.6)
Hemoglobin: 14.4 g/dL (ref 11.1–15.9)
Immature Grans (Abs): 0 10*3/uL (ref 0.0–0.1)
Immature Granulocytes: 0 %
Lymphocytes Absolute: 8.9 10*3/uL — ABNORMAL HIGH (ref 0.7–3.1)
Lymphs: 61 %
MCH: 31.4 pg (ref 26.6–33.0)
MCHC: 33.1 g/dL (ref 31.5–35.7)
MCV: 95 fL (ref 79–97)
Monocytes Absolute: 0.6 10*3/uL (ref 0.1–0.9)
Monocytes: 4 %
Neutrophils Absolute: 4.8 10*3/uL (ref 1.4–7.0)
Neutrophils: 33 %
Platelets: 245 10*3/uL (ref 150–450)
RBC: 4.59 x10E6/uL (ref 3.77–5.28)
RDW: 12.6 % (ref 11.7–15.4)
WBC: 14.6 10*3/uL — ABNORMAL HIGH (ref 3.4–10.8)

## 2023-09-12 LAB — MICROALBUMIN / CREATININE URINE RATIO
Creatinine, Urine: 115.2 mg/dL
Microalb/Creat Ratio: 3 mg/g{creat} (ref 0–29)
Microalbumin, Urine: 4 ug/mL

## 2023-09-12 LAB — COMPREHENSIVE METABOLIC PANEL
ALT: 25 [IU]/L (ref 0–32)
AST: 19 [IU]/L (ref 0–40)
Albumin: 5 g/dL — ABNORMAL HIGH (ref 3.9–4.9)
Alkaline Phosphatase: 73 [IU]/L (ref 44–121)
BUN/Creatinine Ratio: 23 (ref 12–28)
BUN: 19 mg/dL (ref 8–27)
Bilirubin Total: 0.4 mg/dL (ref 0.0–1.2)
CO2: 25 mmol/L (ref 20–29)
Calcium: 10.3 mg/dL (ref 8.7–10.3)
Chloride: 102 mmol/L (ref 96–106)
Creatinine, Ser: 0.83 mg/dL (ref 0.57–1.00)
Globulin, Total: 1.8 g/dL (ref 1.5–4.5)
Glucose: 106 mg/dL — ABNORMAL HIGH (ref 70–99)
Potassium: 4.8 mmol/L (ref 3.5–5.2)
Sodium: 143 mmol/L (ref 134–144)
Total Protein: 6.8 g/dL (ref 6.0–8.5)
eGFR: 77 mL/min/{1.73_m2} (ref >59)

## 2023-09-12 LAB — LIPID PANEL
Chol/HDL Ratio: 3.2 {ratio} (ref 0.0–4.4)
Cholesterol, Total: 169 mg/dL (ref 100–199)
HDL: 53 mg/dL (ref >39)
LDL Chol Calc (NIH): 92 mg/dL (ref 0–99)
Triglycerides: 137 mg/dL (ref 0–149)
VLDL Cholesterol Cal: 24 mg/dL (ref 5–40)

## 2023-09-12 LAB — LIPOPROTEIN A (LPA): Lipoprotein (a): <8.4 nmol/L (ref <75.0)

## 2023-09-12 LAB — TSH+FREE T4
Free T4: 1.34 ng/dL (ref 0.82–1.77)
TSH: 0.992 u[IU]/mL (ref 0.450–4.500)

## 2023-09-12 LAB — VITAMIN D 25 HYDROXY (VIT D DEFICIENCY, FRACTURES): Vit D, 25-Hydroxy: 37.8 ng/mL (ref 30.0–100.0)

## 2023-09-12 LAB — HEMOGLOBIN A1C
Est. average glucose Bld gHb Est-mCnc: 128 mg/dL
Hgb A1c MFr Bld: 6.1 % — ABNORMAL HIGH (ref 4.8–5.6)

## 2023-09-13 NOTE — Assessment & Plan Note (Signed)
Due for screening for mammogram, denies breast concerns, provided with phone number to call and schedule appointment for mammogram. Encouraged to repeat breast cancer screening every 1-2 years.  

## 2023-09-13 NOTE — Assessment & Plan Note (Signed)
Chronic, previously stable On levothyroid at 75 mcg daily Ongoing external stressors; recommend repeat labs to assist

## 2023-09-13 NOTE — Assessment & Plan Note (Signed)
Chronic, Body mass index is 31.01 kg/m. Discussed importance of healthy weight management Discussed diet and exercise

## 2023-09-13 NOTE — Assessment & Plan Note (Signed)
Chronic, unknown Repeat A1c Goal remains to prevent/slow progression to T2DM Previously controlled with diet/exercise

## 2023-09-13 NOTE — Assessment & Plan Note (Signed)
Chronic, The 10-year ASCVD risk score (Arnett DK, et al., 2019) is: 10.3% LDL goal remains <100 Continues on low dose ASA at 81 mg daily as well as recommend use of crestor 40 to assist I continue to recommend diet low in saturated fat and regular exercise - 30 min at least 5 times per week Repeat LP to further assist

## 2023-09-13 NOTE — Assessment & Plan Note (Signed)
Chronic, remains elevated Goal remains 119/79 At this time pt is on hydrochlorothiazide at 25 mg daily Consider further addition of ACE/ARB/CCB to further assist

## 2023-09-13 NOTE — Assessment & Plan Note (Signed)
Recommend Vit D to assist bone health as pt is now post menopausal

## 2023-09-14 ENCOUNTER — Encounter: Payer: Self-pay | Admitting: Internal Medicine

## 2023-09-24 ENCOUNTER — Other Ambulatory Visit: Payer: Self-pay | Admitting: Internal Medicine

## 2023-09-24 DIAGNOSIS — R1013 Epigastric pain: Secondary | ICD-10-CM

## 2023-10-06 ENCOUNTER — Inpatient Hospital Stay: Payer: Medicare Other | Attending: Internal Medicine | Admitting: Internal Medicine

## 2023-10-06 ENCOUNTER — Encounter: Payer: Self-pay | Admitting: Internal Medicine

## 2023-10-06 ENCOUNTER — Inpatient Hospital Stay: Payer: Medicare Other

## 2023-10-06 VITALS — BP 117/65 | HR 77 | Temp 99.2°F | Resp 18 | Wt 207.0 lb

## 2023-10-06 DIAGNOSIS — R61 Generalized hyperhidrosis: Secondary | ICD-10-CM | POA: Diagnosis not present

## 2023-10-06 DIAGNOSIS — C9110 Chronic lymphocytic leukemia of B-cell type not having achieved remission: Secondary | ICD-10-CM | POA: Diagnosis not present

## 2023-10-06 DIAGNOSIS — M1990 Unspecified osteoarthritis, unspecified site: Secondary | ICD-10-CM | POA: Diagnosis not present

## 2023-10-06 DIAGNOSIS — J984 Other disorders of lung: Secondary | ICD-10-CM | POA: Diagnosis not present

## 2023-10-06 DIAGNOSIS — Z87891 Personal history of nicotine dependence: Secondary | ICD-10-CM | POA: Diagnosis not present

## 2023-10-06 LAB — CBC WITH DIFFERENTIAL/PLATELET
Abs Immature Granulocytes: 0.05 10*3/uL (ref 0.00–0.07)
Basophils Absolute: 0.1 10*3/uL (ref 0.0–0.1)
Basophils Relative: 0 %
Eosinophils Absolute: 0.3 10*3/uL (ref 0.0–0.5)
Eosinophils Relative: 3 %
HCT: 39.4 % (ref 36.0–46.0)
Hemoglobin: 12.8 g/dL (ref 12.0–15.0)
Immature Granulocytes: 0 %
Lymphocytes Relative: 50 %
Lymphs Abs: 5.5 10*3/uL — ABNORMAL HIGH (ref 0.7–4.0)
MCH: 31.3 pg (ref 26.0–34.0)
MCHC: 32.5 g/dL (ref 30.0–36.0)
MCV: 96.3 fL (ref 80.0–100.0)
Monocytes Absolute: 0.6 10*3/uL (ref 0.1–1.0)
Monocytes Relative: 5 %
Neutro Abs: 4.7 10*3/uL (ref 1.7–7.7)
Neutrophils Relative %: 42 %
Platelets: 193 10*3/uL (ref 150–400)
RBC: 4.09 MIL/uL (ref 3.87–5.11)
RDW: 12.1 % (ref 11.5–15.5)
WBC: 11.2 10*3/uL — ABNORMAL HIGH (ref 4.0–10.5)
nRBC: 0 % (ref 0.0–0.2)

## 2023-10-06 LAB — LACTATE DEHYDROGENASE: LDH: 106 U/L (ref 98–192)

## 2023-10-06 LAB — HEPATITIS B CORE ANTIBODY, TOTAL: Hep B Core Total Ab: NONREACTIVE

## 2023-10-06 LAB — HEPATITIS B SURFACE ANTIGEN: Hepatitis B Surface Ag: NONREACTIVE

## 2023-10-06 LAB — HEPATITIS C ANTIBODY: HCV Ab: NONREACTIVE

## 2023-10-06 NOTE — Progress Notes (Signed)
Patient is having more anxiety and it is making her have trouble sleeping with out having to take sleep medication. Hot flashes have increased to were she has beads of sweat instead of just feeling hot, so she would like to discuss medications that might help if it is being caused by the cancer. Back pain is constantly rated at about a 9, she is on tramadol.

## 2023-10-09 LAB — FISH HES LEUKEMIA, 4Q12 REA

## 2023-10-13 ENCOUNTER — Ambulatory Visit
Admission: RE | Admit: 2023-10-13 | Discharge: 2023-10-13 | Disposition: A | Discharge status: Home / Self Care | Payer: Medicare Other | Source: Ambulatory Visit | Attending: Internal Medicine | Admitting: Internal Medicine

## 2023-10-13 DIAGNOSIS — C9110 Chronic lymphocytic leukemia of B-cell type not having achieved remission: Secondary | ICD-10-CM | POA: Diagnosis present

## 2023-10-13 MED ORDER — IOHEXOL 300 MG/ML  SOLN
100.0000 mL | Freq: Once | INTRAMUSCULAR | Status: AC | PRN
  Administered 2023-10-13: 100 mL via INTRAVENOUS

## 2023-10-16 NOTE — Progress Notes (Signed)
[facility] CONSULT NOTE  Patient Care Team: Pardue, Monico Blitz, DO as PCP - General (Family Medicine) Michaelyn Barter, MD as Consulting Physician (Oncology)   CANCER STAGING   Cancer Staging  CLL (chronic lymphocytic leukemia) [facility]) Staging form: Chronic Lymphocytic Leukemia / Small Lymphocytic Lymphoma, AJCC 8th Edition - Clinical stage from 10/03/2022: Modified Rai Stage 0 (Modified Rai risk: Low, Lymphocytosis: Present, Adenopathy: Absent, Organomegaly: Absent, Anemia: Absent, Thrombocytopenia: Absent) - Signed by Michaelyn Barter, MD on 10/03/2022 Stage prefix: Initial diagnosis   ASSESSMENT & PLAN:  Natalie Eaton 68 y.o. female with pmh of CAD, prediabetes, hypothyroidism, hypertension, hysterectomy, TBI was referred to hematology for workup of lymphocytosis.  # CLL, Rai stage 0 -Flow cytometry done on 09/27/2022 showed involvement by CD5, CD23, CD20, CD22 positive clonal B-cell population.  She has peripheral lymphocytosis more than 5000 for more than 3 months.  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  -on surveillance since January 2024.  Repeat labs at PCP showed lymphocyte of 8.9.  Patient reached out with the concerns of increase in the lymphocyte count and reports increased sweat.  Has been experiencing feeling of just hot for a decade.  However in the past few weeks, noticing sweats about 4 days a week. Bedsheet feels moist.  Recent TSH has been normal. ?  Related to CLL.  Overall, blood work has been stable.  I will obtain CLL FISH panel today.  Will schedule for CT imaging to assess for any changes in the tumor burden.  Follow-up in 3 weeks to discuss.  #Lung pneumatocele -Follows with Dr. Jayme Cloud.  # Arthritis -Uses left knee brace.  Follows with orthopedic.  Orders Placed This Encounter  Procedures   CT CHEST ABDOMEN PELVIS W CONTRAST    Standing Status:   Future    Number of Occurrences:   1    Expected Date:   10/07/2023     Expiration Date:   10/05/2024    If indicated for the ordered procedure, I authorize the administration of contrast media per Radiology protocol:   Yes    Does the patient have a contrast media/X-ray dye allergy?:   No    Preferred imaging location?:   [facility]    If indicated for the ordered procedure, I authorize the administration of oral contrast media per Radiology protocol:   Yes   CBC with Differential/Platelet    Standing Status:   Future    Number of Occurrences:   1    Expected Date:   10/06/2023    Expiration Date:   10/05/2024   FISH CLL Leukemia    Standing Status:   Future    Number of Occurrences:   1    Expected Date:   10/06/2023    Expiration Date:   10/05/2024   Hepatitis B surface antigen    Standing Status:   Future    Number of Occurrences:   1    Expected Date:   10/06/2023    Expiration Date:   10/05/2024   Hepatitis B core antibody, total    Standing Status:   Future    Number of Occurrences:   1    Expected Date:   10/06/2023    Expiration Date:   10/05/2024   Hepatitis C antibody    Standing Status:   Future    Number of Occurrences:   1    Expected Date:   10/06/2023    Expiration Date:   10/05/2024  Lactate dehydrogenase    Standing Status:   Future    Number of Occurrences:   1    Expected Date:   10/06/2023    Expiration Date:   10/05/2024   RTC in 3 weeks for MD visit.  The total time spent in the appointment was 30 minutes encounter with patients including review of chart and various tests results, discussions about plan of care and coordination of care plan   All questions were answered. The patient knows to call the clinic with any problems, questions or concerns. No barriers to learning was detected.  Michaelyn Barter, MD 1/30/20254:08 PM   HISTORY OF PRESENTING ILLNESS:  Natalie Eaton 68 y.o. female with pmh of CAD, prediabetes, hypothyroidism, hypertension, hysterectomy, TBI was referred to hematology for workup of lymphocytosis.   Further workup diagnostic of CLL.   10/08/2022- CT chest abdomen pelvis done for left-sided abdominal pain.  Did not show any adenopathy or splenomegaly.  Hepatomegaly present.  Right upper lobe pneumatocele seen.  Small pulmonary nodules measuring up to 5 mm favoring infectious or inflammatory.  Small hiatal hernia.  Infraumbilical ventral hernia.  Repeat CT chest in May 2024 showed stable pulmonary nodule likely benign.  No further follow-up recommended.    Interval history Patient seen today as follow-up for CLL stage 0 on surveillance. Patient reached out reporting that the blood work she had at her PCP office showed mild increase in the lymphocytes count and that she has been having worsening sweats for the past few weeks.  Continues to have chronic left-sided abdominal pain.  Denies any fevers, chills, changes with appetite or weight loss.  I have reviewed her chart and materials related to her cancer extensively and collaborated history with the patient. Summary of oncologic history is as follows: Oncology History  CLL (chronic lymphocytic leukemia) (HCC)  10/03/2022 Initial Diagnosis   CLL (chronic lymphocytic leukemia) (HCC)   10/03/2022 Cancer Staging   Staging form: Chronic Lymphocytic Leukemia / Small Lymphocytic Lymphoma, AJCC 8th Edition - Clinical stage from 10/03/2022: Modified Rai Stage 0 (Modified Rai risk: Low, Lymphocytosis: Present, Adenopathy: Absent, Organomegaly: Absent, Anemia: Absent, Thrombocytopenia: Absent) - Signed by Michaelyn Barter, MD on 10/03/2022 Stage prefix: Initial diagnosis     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, per gyn   Hypertension    Hypothyroidism    Insomnia    Leukemia (HCC)    MVP (mitral valve  prolapse)    Pneumonia    Pre-diabetes     SURGICAL HISTORY: Past Surgical History:  Procedure Laterality Date   ABDOMINAL HYSTERECTOMY     BLADDER SURGERY     CARDIAC CATHETERIZATION     GALLBLADDER SURGERY     LUNG REMOVAL, PARTIAL     ROTATOR CUFF REPAIR     TONSILLECTOMY     TUBAL LIGATION      SOCIAL HISTORY: Social History   Socioeconomic History   Marital status: Widowed    Spouse name: Not on file   Number of children: 2   Years of education: Not on file   Highest education level: Not on file  Occupational History   Not on file  Tobacco Use  Smoking status: Former    Current packs/day: 0.00    Average packs/day: 0.3 packs/day for 15.0 years (3.8 ttl pk-yrs)    Types: Cigarettes    Start date: 27    Quit date: 1996    Years since quitting: 29.1   Smokeless tobacco: Never  Vaping Use   Vaping status: Never Used  Substance and Sexual Activity   Alcohol use: Yes    Alcohol/week: 1.0 standard drink of alcohol    Types: 1 Glasses of wine per week   Drug use: No   Sexual activity: Not Currently    Birth control/protection: None  Other Topics Concern   Not on file  Social History Narrative   Not on file   Social Drivers of Health   Financial Resource Strain: Low Risk  (02/25/2023)   Overall Financial Resource Strain (CARDIA)    Difficulty of Paying Living Expenses: Not very hard  Food Insecurity: No Food Insecurity (02/25/2023)   Hunger Vital Sign    Worried About Running Out of Food in the Last Year: Never true    Ran Out of Food in the Last Year: Never true  Transportation Needs: No Transportation Needs (02/25/2023)   PRAPARE - Administrator, Civil Service (Medical): No    Lack of Transportation (Non-Medical): No  Physical Activity: Sufficiently Active (02/25/2023)   Exercise Vital Sign    Days of Exercise per Week: 5 days    Minutes of Exercise per Session: 30 min  Stress: No Stress Concern Present (02/25/2023)   [employer] of  Occupational Health - Occupational Stress Questionnaire    Feeling of Stress : Not at all  Social Connections: Moderately Isolated (02/25/2023)   Social Connection and Isolation Panel [NHANES]    Frequency of Communication with Friends and Family: More than three times a week    Frequency of Social Gatherings with Friends and Family: Never    Attends Religious Services: More than 4 times per year    Active Member of [employer] or Organizations: No    Attends Banker Meetings: Never    Marital Status: Widowed  Intimate Partner Violence: Not At Risk (02/25/2023)   Humiliation, Afraid, Rape, and Kick questionnaire    Fear of Current or Ex-Partner: No    Emotionally Abused: No    Physically Abused: No    Sexually Abused: No    FAMILY HISTORY: Family History  Problem Relation Age of Onset   Healthy Mother    Scoliosis Mother    Heart disease Father    Heart disease Brother    Healthy Daughter    Healthy Son     ALLERGIES:  is allergic to codeine.  MEDICATIONS:  Current Outpatient Medications  Medication Sig Dispense Refill   Albuterol-Budesonide (AIRSUPRA) 90-80 MCG/ACT AERO Inhale 2 puffs into the lungs every 4 (four) hours as needed.     aspirin 81 MG chewable tablet Chew 81 mg by mouth daily.     celecoxib (CELEBREX) 200 MG capsule TAKE 1 CAPSULE BY MOUTH TWICE A DAY 120 capsule 0   gabapentin (NEURONTIN) 300 MG capsule TAKE 1 CAPSULE BY MOUTH THREE TIMES A DAY 270 capsule 0   hydrochlorothiazide (HYDRODIURIL) 25 MG tablet Take 1 tablet (25 mg total) by mouth daily. 30 tablet 11   levothyroxine (SYNTHROID) 75 MCG tablet TAKE 1 TABLET BY MOUTH EVERY DAY 90 tablet 1   methocarbamol (ROBAXIN) 750 MG tablet Take 1 tablet (750 mg total) by mouth 2 (two) times daily  as needed for muscle spasms. 30 tablet 3   Multiple Vitamin (MULTIVITAMIN ADULT PO) Take 1 each by mouth daily at 2 PM.     pantoprazole (PROTONIX) 40 MG tablet TAKE 1 TABLET BY MOUTH EVERY DAY 90 tablet 0    potassium chloride (MICRO-K) 10 MEQ CR capsule Take 1 capsule (10 mEq total) by mouth 2 (two) times daily. 180 capsule 1   rosuvastatin (CRESTOR) 40 MG tablet Take 1 tablet (40 mg total) by mouth daily. 30 tablet 11   traMADol (ULTRAM) 50 MG tablet Take 50 mg by mouth 3 (three) times daily.     traZODone (DESYREL) 100 MG tablet Take 100 mg by mouth at bedtime.     valACYclovir (VALTREX) 500 MG tablet Take 1 tablet (500 mg total) by mouth daily. Or BID for 3 days prn sx 90 tablet 2   No current facility-administered medications for this visit.    REVIEW OF SYSTEMS:   Pertinent information mentioned in HPI All other systems were reviewed with the patient and are negative.  PHYSICAL EXAMINATION: ECOG PERFORMANCE STATUS: 1 - Symptomatic but completely ambulatory  Vitals:   10/06/23 1436  BP: 117/65  Pulse: 77  Resp: 18  Temp: 99.2 F (37.3 C)  SpO2: 100%    Filed Weights   10/06/23 1436  Weight: 207 lb (93.9 kg)     GENERAL:alert, no distress and comfortable SKIN: skin color, texture, turgor are normal, no rashes or significant lesions EYES: normal, conjunctiva are pink and non-injected, sclera clear OROPHARYNX:no exudate, no erythema and lips, buccal mucosa, and tongue normal  NECK: supple, thyroid normal size, non-tender, without nodularity LYMPH:  no palpable lymphadenopathy in the cervical, axillary or inguinal LUNGS: clear to auscultation and percussion with normal breathing effort HEART: regular rate & rhythm and no murmurs and no lower extremity edema ABDOMEN:abdomen soft, non-tender and normal bowel sounds Musculoskeletal:no cyanosis of digits and no clubbing  PSYCH: alert & oriented x 3 with fluent speech NEURO: no focal motor/sensory deficits  LABORATORY DATA:  I have reviewed the data as listed Lab Results  Component Value Date   WBC 11.2 (H) 10/06/2023   HGB 12.8 10/06/2023   HCT 39.4 10/06/2023   MCV 96.3 10/06/2023   PLT 193 10/06/2023   Recent Labs     01/02/23 1040 07/04/23 0954 09/11/23 0836  NA 138 137 143  K 4.2 3.7 4.8  CL 104 102 102  CO2 26 26 25   GLUCOSE 98 103* 106*  BUN 26* 38* 19  CREATININE 0.73 0.72 0.83  CALCIUM 9.3 9.4 10.3  GFRNONAA >60 >60  --   PROT 6.8 7.2 6.8  ALBUMIN 4.3 4.3 5.0*  AST 18 15 19   ALT 20 17 25   ALKPHOS 53 62 73  BILITOT 0.3 0.3 0.4    RADIOGRAPHIC STUDIES: I have personally reviewed the radiological images as listed and agreed with the findings in the report. CT CHEST ABDOMEN PELVIS W CONTRAST Result Date: 10/13/2023 CLINICAL DATA:  CLL.  Worsening night sweats.  * Tracking Code: BO * EXAM: CT CHEST, ABDOMEN, AND PELVIS WITH CONTRAST TECHNIQUE: Multidetector CT imaging of the chest, abdomen and pelvis was performed following the standard protocol during bolus administration of intravenous contrast. RADIATION DOSE REDUCTION: This exam was performed according to the departmental dose-optimization program which includes automated exposure control, adjustment of the mA and/or kV according to patient size and/or use of iterative reconstruction technique. CONTRAST:  OMNIPAQUE IOHEXOL 300 MG/ML  SOLN COMPARISON:  Chest CT  without contrast 01/20/2023. Chest abdomen pelvis CT 10/08/2022. FINDINGS: CT CHEST FINDINGS Cardiovascular: Heart is nonenlarged. Trace pericardial fluid. Coronary artery calcifications are noted. Mediastinum/Nodes: Small hiatal hernia. Normal caliber thoracic esophagus. Small thyroid gland. No specific abnormal lymph node enlargement identified in the axillary regions, hilum or mediastinum. In addition no abnormal nodal enlargement supraclavicular, chest wall, internal mammary chain retrocrural. Only a few small less than 1 cm size is some nodes are seen, not pathologic by size criteria and are unchanged from prior when adjusted for technique. No new dominant mass identified. Some areas of scar atelectatic changes along the lung bases as well are again noted. Slight breathing motion.  Small fat containing diaphragmatic hernia along the posteromedial right hemithorax, lung base. Likely congenital. Lungs/Pleura: No consolidation, pneumothorax or effusion. Once again there is a cavitary focus, pneumatocele, in the right upper lobe centrally with adjacent scarring and fibrotic changes, similar to previous. Slight wall nodularity of this lesion is stable. Associated distortion and apical pleural thickening. Musculoskeletal: Curvature of the spine with moderate degenerative changes. Degenerative changes of the shoulders. Chronic right rib deformities as well. CT ABDOMEN PELVIS FINDINGS Hepatobiliary: Stable ectasia of the biliary tree with previous cholecystectomy. Slight abrupt caliber change of the common duct of the ambulate, nonspecific. Again this is stable. No space-occupying liver lesion. Slightly atypical comparable course to branches of the middle hepatic vein. Pancreas: Unremarkable. No pancreatic ductal dilatation or surrounding inflammatory changes. Spleen: No splenic lesion. Small splenule. The spleen is nonenlarged. Cephalocaudal length approaches 10.2 cm. Adrenals/Urinary Tract: Adrenal glands are unremarkable. Kidneys are normal, without renal calculi, focal lesion, or hydronephrosis. Bosniak 2 right-sided small renal cyst. Bladder is unremarkable. Stomach/Bowel: Large bowel has a normal course and caliber with scattered stool. Few scattered colonic diverticula. The stomach and small bowel are nondilated. There are some loops of colon extending between the anterior liver margin in the anterior abdominal wall. Vascular/Lymphatic: Aortic atherosclerosis. No enlarged abdominal or pelvic lymph nodes. Reproductive: Status post hysterectomy. No adnexal masses. Other: Midline anterior hernias identified including at the umbilicus and low anterior pelvis. Loop of bowel extends along the low anterior pelvic hernia as on prior. No obstruction Musculoskeletal: Curvature spine with moderate  degenerative changes particularly of the lumbar spine. Some degenerative changes of the pelvis as well including the sacroiliac joints. IMPRESSION: No developing lymph node enlargement or splenomegaly. Stable pneumatocele with some distortion and nodularity the right lung apex. Small hiatal hernia. Colonic diverticula. Previous cholecystectomy with stable ectasia of the extrahepatic common duct extending to the ampulla with slightly abrupt transition but appearance is essentially unchanged from previous exam. Attention on follow-up. Midline anterior abdominopelvic wall hernias. The herniated just above the pubic symphysis the midline does contain small bowel without obstruction. Electronically Signed   By: Karen Kays M.D.   On: 10/13/2023 11:42

## 2023-10-22 ENCOUNTER — Telehealth: Payer: Self-pay | Admitting: Family Medicine

## 2023-10-22 NOTE — Telephone Encounter (Signed)
 [facility] faxed refill request for the following medications:   traZODone  (DESYREL ) 100 MG tablet    Please advise

## 2023-10-23 MED ORDER — TRAZODONE HCL 100 MG PO TABS: 100.0000 mg | ORAL_TABLET | Freq: Every day | ORAL | 1 refills | Status: AC

## 2023-10-27 ENCOUNTER — Encounter: Payer: Self-pay | Admitting: Internal Medicine

## 2023-10-27 ENCOUNTER — Inpatient Hospital Stay (HOSPITAL_BASED_OUTPATIENT_CLINIC_OR_DEPARTMENT_OTHER): Payer: Medicare Other | Admitting: Internal Medicine

## 2023-10-27 ENCOUNTER — Inpatient Hospital Stay: Payer: Medicare Other | Attending: Internal Medicine

## 2023-10-27 VITALS — BP 122/78 | HR 77 | Temp 97.9°F | Resp 16 | Wt 206.0 lb

## 2023-10-27 DIAGNOSIS — Z87891 Personal history of nicotine dependence: Secondary | ICD-10-CM | POA: Diagnosis not present

## 2023-10-27 DIAGNOSIS — C9110 Chronic lymphocytic leukemia of B-cell type not having achieved remission: Secondary | ICD-10-CM

## 2023-10-27 DIAGNOSIS — M1990 Unspecified osteoarthritis, unspecified site: Secondary | ICD-10-CM | POA: Insufficient documentation

## 2023-10-27 DIAGNOSIS — J984 Other disorders of lung: Secondary | ICD-10-CM | POA: Insufficient documentation

## 2023-10-27 DIAGNOSIS — R1013 Epigastric pain: Secondary | ICD-10-CM | POA: Diagnosis not present

## 2023-10-27 LAB — CBC WITH DIFFERENTIAL/PLATELET
Abs Immature Granulocytes: 0.06 10*3/uL (ref 0.00–0.07)
Basophils Absolute: 0.1 10*3/uL (ref 0.0–0.1)
Basophils Relative: 1 %
Eosinophils Absolute: 0.3 10*3/uL (ref 0.0–0.5)
Eosinophils Relative: 2 %
HCT: 42.5 % (ref 36.0–46.0)
Hemoglobin: 14.1 g/dL (ref 12.0–15.0)
Immature Granulocytes: 1 %
Lymphocytes Relative: 46 %
Lymphs Abs: 6.1 10*3/uL — ABNORMAL HIGH (ref 0.7–4.0)
MCH: 31.3 pg (ref 26.0–34.0)
MCHC: 33.2 g/dL (ref 30.0–36.0)
MCV: 94.4 fL (ref 80.0–100.0)
Monocytes Absolute: 0.6 10*3/uL (ref 0.1–1.0)
Monocytes Relative: 5 %
Neutro Abs: 6 10*3/uL (ref 1.7–7.7)
Neutrophils Relative %: 45 %
Platelets: 197 10*3/uL (ref 150–400)
RBC: 4.5 MIL/uL (ref 3.87–5.11)
RDW: 12.1 % (ref 11.5–15.5)
WBC: 13.1 10*3/uL — ABNORMAL HIGH (ref 4.0–10.5)
nRBC: 0 % (ref 0.0–0.2)

## 2023-10-27 LAB — COMPREHENSIVE METABOLIC PANEL
ALT: 27 U/L (ref 0–44)
AST: 23 U/L (ref 15–41)
Albumin: 4.4 g/dL (ref 3.5–5.0)
Alkaline Phosphatase: 55 U/L (ref 38–126)
Anion gap: 11 (ref 5–15)
BUN: 24 mg/dL — ABNORMAL HIGH (ref 8–23)
CO2: 24 mmol/L (ref 22–32)
Calcium: 9.5 mg/dL (ref 8.9–10.3)
Chloride: 101 mmol/L (ref 98–111)
Creatinine, Ser: 0.73 mg/dL (ref 0.44–1.00)
GFR, Estimated: >60 mL/min (ref >60)
Glucose, Bld: 101 mg/dL — ABNORMAL HIGH (ref 70–99)
Potassium: 3.6 mmol/L (ref 3.5–5.1)
Sodium: 136 mmol/L (ref 135–145)
Total Bilirubin: 0.5 mg/dL (ref 0.0–1.2)
Total Protein: 7 g/dL (ref 6.5–8.1)

## 2023-10-27 LAB — LACTATE DEHYDROGENASE: LDH: 116 U/L (ref 98–192)

## 2023-10-27 NOTE — Progress Notes (Signed)
 Patient had a CT scan on 10/13/2023. Patient is still having hot flashes throughout the day and at night, which she thinks it could be due to taking to many tramadol. She says that she is taking them as prescribed.  She sees Dr. Rexanne Catalina on 11/04/2023 to get the shots in her back.

## 2023-10-27 NOTE — Progress Notes (Signed)
 [facility] CONSULT NOTE  Patient Care Team: Pardue, Asencion Blacksmith, DO as PCP - General (Family Medicine) Loreatha Rodney, MD as Consulting Physician (Oncology)   CANCER STAGING   Cancer Staging  CLL (chronic lymphocytic leukemia) [facility]) Staging form: Chronic Lymphocytic Leukemia / Small Lymphocytic Lymphoma, AJCC 8th Edition - Clinical stage from 10/03/2022: Modified Rai Stage 0 (Modified Rai risk: Low, Lymphocytosis: Present, Adenopathy: Absent, Organomegaly: Absent, Anemia: Absent, Thrombocytopenia: Absent) - Signed by Iya Hamed, MD on 10/03/2022 Stage prefix: Initial diagnosis   ASSESSMENT & PLAN:  Natalie Eaton 68 y.o. female with pmh of CAD, prediabetes, hypothyroidism, hypertension, hysterectomy, TBI was referred to hematology for workup of lymphocytosis.  # CLL, Rai stage 0 -Flow cytometry done on 09/27/2022 showed involvement by CD5, CD23, CD20, CD22 positive clonal B-cell population.  She has peripheral lymphocytosis more than 5000 for more than 3 months.  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  -on surveillance since January 2024.    -Repeat CT imaging does not show any concerning lymphadenopathy.  This was done as she was complaining of increasing sweats.  CLL FISH panel showed 13 q. deletion as sole abnormality.  She continues to have symptoms of hot and cold with sweats which may happen during the day or at night intermittently.  Not necessarily drenching but but she feels moist.  I discussed with the patient that she has good prognostic indicators with IGHV somatic mutation and 13 q. deletion as a sole abnormality.  Patients with CLL with these mutations tend to have longer survival and less need for treatment compared to other mutations.  Blood count overall looks stable.  No cytopenias.  We discussed about the risk and benefit of the treatment. She is agreeable to continue with surveillance. Follow-up in 4 months. I will reach out to Dr.  Athena Bland to see if she can be weaned off trazodone  and instead be started on Effexor which may help with the hot and cold sensation.  Her thyroid numbers have been normal.  #Lung pneumatocele -Follows with Dr. Viva Grise.  # Arthritis -Uses left knee brace.  Follows with orthopedic.  Orders Placed This Encounter  Procedures   CBC with Differential (Cancer Center Only)    Standing Status:   Future    Expected Date:   02/24/2024    Expiration Date:   10/26/2024   CMP (Cancer Center only)    Standing Status:   Future    Expected Date:   02/24/2024    Expiration Date:   10/26/2024   Lactate dehydrogenase    Standing Status:   Future    Expected Date:   02/24/2024    Expiration Date:   10/26/2024   RTC in 3 weeks for MD visit.  The total time spent in the appointment was 30 minutes encounter with patients including review of chart and various tests results, discussions about plan of care and coordination of care plan   All questions were answered. The patient knows to call the clinic with any problems, questions or concerns. No barriers to learning was detected.  Loreatha Rodney, MD 2/10/20253:04 PM   HISTORY OF PRESENTING ILLNESS:  Natalie Eaton 68 y.o. female with pmh of CAD, prediabetes, hypothyroidism, hypertension, hysterectomy, TBI was referred to hematology for workup of lymphocytosis.  Further workup diagnostic of CLL.   10/08/2022- CT chest abdomen pelvis done for left-sided abdominal pain.  Did not show any adenopathy or splenomegaly.  Hepatomegaly present.  Right upper lobe pneumatocele seen.  Small pulmonary nodules measuring up to 5 mm favoring infectious or inflammatory.  Small hiatal hernia.  Infraumbilical ventral hernia.  Repeat CT chest in May 2024 showed stable pulmonary nodule likely benign.  No further follow-up recommended.  10/27/23 -patient called with concerns for feeling hot and cold and having sweats during the day or night.  Reports moist in her bed sheets.  CT  imaging was repeated with no concerns for lymphadenopathy.  No splenomegaly.  No cytopenias.  Denies any fever, neurologic loss or other constitutional symptoms.  Interval history Patient seen today as follow-up for CLL stage 0 on surveillance. Reports she continues to have the same feeling of hot and cold.  Having sweats during the day and at night intermittently.  Reports when she sleeps bedsheet feels moist.  Denies any fevers, weight loss, fatigue  I have reviewed her chart and materials related to her cancer extensively and collaborated history with the patient. Summary of oncologic history is as follows: Oncology History  CLL (chronic lymphocytic leukemia) (HCC)  10/03/2022 Initial Diagnosis   CLL (chronic lymphocytic leukemia) (HCC)   10/03/2022 Cancer Staging   Staging form: Chronic Lymphocytic Leukemia / Small Lymphocytic Lymphoma, AJCC 8th Edition - Clinical stage from 10/03/2022: Modified Rai Stage 0 (Modified Rai risk: Low, Lymphocytosis: Present, Adenopathy: Absent, Organomegaly: Absent, Anemia: Absent, Thrombocytopenia: Absent) - Signed by Loreatha Rodney, MD on 10/03/2022 Stage prefix: Initial diagnosis     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, per gyn   Hypertension    Hypothyroidism    Insomnia    Leukemia (HCC)    MVP (mitral valve prolapse)    Pneumonia    Pre-diabetes     SURGICAL HISTORY: Past Surgical History:  Procedure Laterality Date   ABDOMINAL HYSTERECTOMY     BLADDER SURGERY     CARDIAC CATHETERIZATION     GALLBLADDER SURGERY     LUNG REMOVAL, PARTIAL     ROTATOR CUFF REPAIR     TONSILLECTOMY     TUBAL LIGATION      SOCIAL HISTORY: Social History   Socioeconomic History   Marital status: Widowed     Spouse name: Not on file   Number of children: 2   Years of education: Not on file   Highest education level: Not on file  Occupational History   Not on file  Tobacco Use   Smoking status: Former    Current packs/day: 0.00    Average packs/day: 0.3 packs/day for 15.0 years (3.8 ttl pk-yrs)    Types: Cigarettes    Start date: 36    Quit date: 52    Years since quitting: 29.1   Smokeless tobacco: Never  Vaping Use   Vaping status: Never Used  Substance and Sexual Activity   Alcohol use: Yes    Alcohol/week: 1.0 standard drink of alcohol    Types: 1 Glasses of wine per week   Drug use: No   Sexual activity: Not Currently    Birth control/protection: None  Other Topics Concern   Not on file  Social History Narrative   Not on file   Social Drivers of Health   Financial Resource Strain: Low Risk  (  02/25/2023)   Overall Financial Resource Strain (CARDIA)    Difficulty of Paying Living Expenses: Not very hard  Food Insecurity: No Food Insecurity (02/25/2023)   Hunger Vital Sign    Worried About Running Out of Food in the Last Year: Never true    Ran Out of Food in the Last Year: Never true  Transportation Needs: No Transportation Needs (02/25/2023)   PRAPARE - Administrator, Civil Service (Medical): No    Lack of Transportation (Non-Medical): No  Physical Activity: Sufficiently Active (02/25/2023)   Exercise Vital Sign    Days of Exercise per Week: 5 days    Minutes of Exercise per Session: 30 min  Stress: No Stress Concern Present (02/25/2023)   [employer] of Occupational Health - Occupational Stress Questionnaire    Feeling of Stress : Not at all  Social Connections: Moderately Isolated (02/25/2023)   Social Connection and Isolation Panel [NHANES]    Frequency of Communication with Friends and Family: More than three times a week    Frequency of Social Gatherings with Friends and Family: Never    Attends Religious Services: More than 4 times per year     Active Member of [employer] or Organizations: No    Attends Banker Meetings: Never    Marital Status: Widowed  Intimate Partner Violence: Not At Risk (02/25/2023)   Humiliation, Afraid, Rape, and Kick questionnaire    Fear of Current or Ex-Partner: No    Emotionally Abused: No    Physically Abused: No    Sexually Abused: No    FAMILY HISTORY: Family History  Problem Relation Age of Onset   Healthy Mother    Scoliosis Mother    Heart disease Father    Heart disease Brother    Healthy Daughter    Healthy Son     ALLERGIES:  is allergic to codeine.  MEDICATIONS:  Current Outpatient Medications  Medication Sig Dispense Refill   Albuterol -Budesonide (AIRSUPRA ) 90-80 MCG/ACT AERO Inhale 2 puffs into the lungs every 4 (four) hours as needed.     aspirin 81 MG chewable tablet Chew 81 mg by mouth daily.     celecoxib  (CELEBREX ) 200 MG capsule TAKE 1 CAPSULE BY MOUTH TWICE A DAY 120 capsule 0   gabapentin  (NEURONTIN ) 300 MG capsule TAKE 1 CAPSULE BY MOUTH THREE TIMES A DAY 270 capsule 0   hydrochlorothiazide  (HYDRODIURIL ) 25 MG tablet Take 1 tablet (25 mg total) by mouth daily. 30 tablet 11   levothyroxine  (SYNTHROID ) 75 MCG tablet TAKE 1 TABLET BY MOUTH EVERY DAY 90 tablet 1   methocarbamol  (ROBAXIN ) 750 MG tablet Take 1 tablet (750 mg total) by mouth 2 (two) times daily as needed for muscle spasms. 30 tablet 3   Multiple Vitamin (MULTIVITAMIN ADULT PO) Take 1 each by mouth daily at 2 PM.     pantoprazole  (PROTONIX ) 40 MG tablet TAKE 1 TABLET BY MOUTH EVERY DAY 90 tablet 0   potassium chloride  (MICRO-K ) 10 MEQ CR capsule Take 1 capsule (10 mEq total) by mouth 2 (two) times daily. 180 capsule 1   rosuvastatin  (CRESTOR ) 40 MG tablet Take 1 tablet (40 mg total) by mouth daily. 30 tablet 11   traMADol (ULTRAM) 50 MG tablet Take 50 mg by mouth 3 (three) times daily.     traZODone  (DESYREL ) 100 MG tablet Take 1 tablet (100 mg total) by mouth at bedtime. 30 tablet 1   valACYclovir   (VALTREX ) 500 MG tablet Take 1 tablet (500 mg total)  by mouth daily. Or BID for 3 days prn sx 90 tablet 2   No current facility-administered medications for this visit.    REVIEW OF SYSTEMS:   Pertinent information mentioned in HPI All other systems were reviewed with the patient and are negative.  PHYSICAL EXAMINATION: ECOG PERFORMANCE STATUS: 1 - Symptomatic but completely ambulatory  Vitals:   10/27/23 1347  BP: 122/78  Pulse: 77  Resp: 16  Temp: 97.9 F (36.6 C)  SpO2: 100%    Filed Weights   10/27/23 1347  Weight: 206 lb (93.4 kg)     GENERAL:alert, no distress and comfortable SKIN: skin color, texture, turgor are normal, no rashes or significant lesions EYES: normal, conjunctiva are pink and non-injected, sclera clear OROPHARYNX:no exudate, no erythema and lips, buccal mucosa, and tongue normal  NECK: supple, thyroid normal size, non-tender, without nodularity LYMPH:  no palpable lymphadenopathy in the cervical, axillary or inguinal LUNGS: clear to auscultation and percussion with normal breathing effort HEART: regular rate & rhythm and no murmurs and no lower extremity edema ABDOMEN:abdomen soft, non-tender and normal bowel sounds Musculoskeletal:no cyanosis of digits and no clubbing  PSYCH: alert & oriented x 3 with fluent speech NEURO: no focal motor/sensory deficits  LABORATORY DATA:  I have reviewed the data as listed Lab Results  Component Value Date   WBC 13.1 (H) 10/27/2023   HGB 14.1 10/27/2023   HCT 42.5 10/27/2023   MCV 94.4 10/27/2023   PLT 197 10/27/2023   Recent Labs    01/02/23 1040 07/04/23 0954 09/11/23 0836 10/27/23 1328  NA 138 137 143 136  K 4.2 3.7 4.8 3.6  CL 104 102 102 101  CO2 26 26 25 24   GLUCOSE 98 103* 106* 101*  BUN 26* 38* 19 24*  CREATININE 0.73 0.72 0.83 0.73  CALCIUM  9.3 9.4 10.3 9.5  GFRNONAA >60 >60  --  >60  PROT 6.8 7.2 6.8 7.0  ALBUMIN 4.3 4.3 5.0* 4.4  AST 18 15 19 23   ALT 20 17 25 27   ALKPHOS 53 62 73  55  BILITOT 0.3 0.3 0.4 0.5    RADIOGRAPHIC STUDIES: I have personally reviewed the radiological images as listed and agreed with the findings in the report. CT CHEST ABDOMEN PELVIS W CONTRAST Result Date: 10/13/2023 CLINICAL DATA:  CLL.  Worsening night sweats.  * Tracking Code: BO * EXAM: CT CHEST, ABDOMEN, AND PELVIS WITH CONTRAST TECHNIQUE: Multidetector CT imaging of the chest, abdomen and pelvis was performed following the standard protocol during bolus administration of intravenous contrast. RADIATION DOSE REDUCTION: This exam was performed according to the departmental dose-optimization program which includes automated exposure control, adjustment of the mA and/or kV according to patient size and/or use of iterative reconstruction technique. CONTRAST:  OMNIPAQUE  IOHEXOL  300 MG/ML  SOLN COMPARISON:  Chest CT without contrast 01/20/2023. Chest abdomen pelvis CT 10/08/2022. FINDINGS: CT CHEST FINDINGS Cardiovascular: Heart is nonenlarged. Trace pericardial fluid. Coronary artery calcifications are noted. Mediastinum/Nodes: Small hiatal hernia. Normal caliber thoracic esophagus. Small thyroid gland. No specific abnormal lymph node enlargement identified in the axillary regions, hilum or mediastinum. In addition no abnormal nodal enlargement supraclavicular, chest wall, internal mammary chain retrocrural. Only a few small less than 1 cm size is some nodes are seen, not pathologic by size criteria and are unchanged from prior when adjusted for technique. No new dominant mass identified. Some areas of scar atelectatic changes along the lung bases as well are again noted. Slight breathing motion. Small fat containing diaphragmatic  hernia along the posteromedial right hemithorax, lung base. Likely congenital. Lungs/Pleura: No consolidation, pneumothorax or effusion. Once again there is a cavitary focus, pneumatocele, in the right upper lobe centrally with adjacent scarring and fibrotic changes, similar  to previous. Slight wall nodularity of this lesion is stable. Associated distortion and apical pleural thickening. Musculoskeletal: Curvature of the spine with moderate degenerative changes. Degenerative changes of the shoulders. Chronic right rib deformities as well. CT ABDOMEN PELVIS FINDINGS Hepatobiliary: Stable ectasia of the biliary tree with previous cholecystectomy. Slight abrupt caliber change of the common duct of the ambulate, nonspecific. Again this is stable. No space-occupying liver lesion. Slightly atypical comparable course to branches of the middle hepatic vein. Pancreas: Unremarkable. No pancreatic ductal dilatation or surrounding inflammatory changes. Spleen: No splenic lesion. Small splenule. The spleen is nonenlarged. Cephalocaudal length approaches 10.2 cm. Adrenals/Urinary Tract: Adrenal glands are unremarkable. Kidneys are normal, without renal calculi, focal lesion, or hydronephrosis. Bosniak 2 right-sided small renal cyst. Bladder is unremarkable. Stomach/Bowel: Large bowel has a normal course and caliber with scattered stool. Few scattered colonic diverticula. The stomach and small bowel are nondilated. There are some loops of colon extending between the anterior liver margin in the anterior abdominal wall. Vascular/Lymphatic: Aortic atherosclerosis. No enlarged abdominal or pelvic lymph nodes. Reproductive: Status post hysterectomy. No adnexal masses. Other: Midline anterior hernias identified including at the umbilicus and low anterior pelvis. Loop of bowel extends along the low anterior pelvic hernia as on prior. No obstruction Musculoskeletal: Curvature spine with moderate degenerative changes particularly of the lumbar spine. Some degenerative changes of the pelvis as well including the sacroiliac joints. IMPRESSION: No developing lymph node enlargement or splenomegaly. Stable pneumatocele with some distortion and nodularity the right lung apex. Small hiatal hernia. Colonic  diverticula. Previous cholecystectomy with stable ectasia of the extrahepatic common duct extending to the ampulla with slightly abrupt transition but appearance is essentially unchanged from previous exam. Attention on follow-up. Midline anterior abdominopelvic wall hernias. The herniated just above the pubic symphysis the midline does contain small bowel without obstruction. Electronically Signed   By: Adrianna Horde M.D.   On: 10/13/2023 11:42

## 2023-10-29 ENCOUNTER — Ambulatory Visit: Payer: Medicare Other | Admitting: Cardiovascular Disease

## 2023-11-03 ENCOUNTER — Ambulatory Visit: Payer: Medicare Other | Admitting: Physician Assistant

## 2023-11-03 VITALS — BP 135/86 | HR 81 | Temp 98.7°F | Ht 67.0 in | Wt 205.0 lb

## 2023-11-03 DIAGNOSIS — E7800 Pure hypercholesterolemia, unspecified: Secondary | ICD-10-CM

## 2023-11-03 DIAGNOSIS — J029 Acute pharyngitis, unspecified: Secondary | ICD-10-CM

## 2023-11-03 LAB — POCT RAPID STREP A (OFFICE): Rapid Strep A Screen: NEGATIVE

## 2023-11-03 NOTE — Progress Notes (Unsigned)
 Established patient visit  Patient: Natalie Eaton   DOB: 05-Oct-1955   68 y.o. Female  MRN: [id number] Visit Date: 11/03/2023  Today's healthcare provider: Debera Lat, PA-C   Chief Complaint  Patient presents with   Sore Throat    Patient reports having sore throat for three days.  She states she has also started having head congestion and cough.     Subjective       Discussed the use of AI scribe software for clinical note transcription with the patient, who gave verbal consent to proceed.  History of Present Illness   The patient, with a history of leukemia, scoliosis, and lung cysts, presents with a sore throat. She describes the throat as scratchy and has been using Zicam to manage symptoms. The patient also reports experiencing hot flashes, alternating between chills and sweating. She has been managing these symptoms with homemade cough syrup made from honey, echinacea, and goldenrod.  The patient also discusses concerns about her cholesterol medication. She was previously on 10mg , which was increased to 40mg  by a previous provider. The patient, however, decided to self-adjust the dosage to 20mg  due to concerns about the higher dose. She plans to gradually increase the dosage to 40mg  over time, monitoring for any adverse effects.  The patient also mentions a potential diagnosis of peripheral artery disease (PAD), as she has small veins and a lack of pulse in the ankle. She is scheduled to see a heart doctor for further evaluation. Additionally, the patient mentions a cyst in the right lung, which is being monitored as it is currently not growing.           02/25/2023   11:17 AM 10/15/2022    9:34 AM 09/20/2022    1:56 PM  Depression screen PHQ 2/9  Decreased Interest 0 0 0  Down, Depressed, Hopeless 0 0 0  PHQ - 2 Score 0 0 0  Altered sleeping  2 3  Tired, decreased energy  3 3  Change in appetite  2 0  Feeling bad or failure about yourself   0 0  Trouble concentrating  0  0  Moving slowly or fidgety/restless  0 0  Suicidal thoughts  0 0  PHQ-9 Score  7 6  Difficult doing work/chores  Somewhat difficult Not difficult at all       No data to display          Medications: Outpatient Medications Prior to Visit  Medication Sig   Albuterol-Budesonide (AIRSUPRA) 90-80 MCG/ACT AERO Inhale 2 puffs into the lungs every 4 (four) hours as needed.   aspirin 81 MG chewable tablet Chew 81 mg by mouth daily.   celecoxib (CELEBREX) 200 MG capsule TAKE 1 CAPSULE BY MOUTH TWICE A DAY   gabapentin (NEURONTIN) 300 MG capsule TAKE 1 CAPSULE BY MOUTH THREE TIMES A DAY   hydrochlorothiazide (HYDRODIURIL) 25 MG tablet Take 1 tablet (25 mg total) by mouth daily.   levothyroxine (SYNTHROID) 75 MCG tablet TAKE 1 TABLET BY MOUTH EVERY DAY   methocarbamol (ROBAXIN) 750 MG tablet Take 1 tablet (750 mg total) by mouth 2 (two) times daily as needed for muscle spasms.   Multiple Vitamin (MULTIVITAMIN ADULT PO) Take 1 each by mouth daily at 2 PM.   pantoprazole (PROTONIX) 40 MG tablet TAKE 1 TABLET BY MOUTH EVERY DAY   potassium chloride (MICRO-K) 10 MEQ CR capsule Take 1 capsule (10 mEq total) by mouth 2 (two) times daily.   rosuvastatin (CRESTOR) 40 MG tablet  Take 1 tablet (40 mg total) by mouth daily.   traMADol (ULTRAM) 50 MG tablet Take 50 mg by mouth 3 (three) times daily.   traZODone (DESYREL) 100 MG tablet Take 1 tablet (100 mg total) by mouth at bedtime.   valACYclovir (VALTREX) 500 MG tablet Take 1 tablet (500 mg total) by mouth daily. Or BID for 3 days prn sx   No facility-administered medications prior to visit.    Review of Systems All negative Except see HPI       Objective    BP 135/86 (BP Location: Left Arm, Patient Position: Sitting, Cuff Size: Normal)   Pulse 81   Temp 98.7 F (37.1 C) (Oral)   Ht 5\' 7"  (1.702 m)   Wt 205 lb (93 kg)   SpO2 100%   BMI 32.11 kg/m     Physical Exam Vitals reviewed.  Constitutional:      Appearance: She is normal  weight.  HENT:     Head: Normocephalic and atraumatic.     Right Ear: Ear canal and external ear normal.     Left Ear: Ear canal and external ear normal.     Nose: Congestion and rhinorrhea present.     Mouth/Throat:     Pharynx: Posterior oropharyngeal erythema present.     Comments: Postnasal drainage noted Eyes:     General: No scleral icterus.       Right eye: No discharge.        Left eye: No discharge.     Extraocular Movements: Extraocular movements intact.     Pupils: Pupils are equal, round, and reactive to light.  Cardiovascular:     Rate and Rhythm: Normal rate and regular rhythm.  Pulmonary:     Effort: Pulmonary effort is normal.     Breath sounds: Normal breath sounds.  Abdominal:     General: Abdomen is flat. Bowel sounds are normal.     Palpations: Abdomen is soft.  Lymphadenopathy:     Cervical: No cervical adenopathy.  Neurological:     Mental Status: She is alert.      Results for orders placed or performed in visit on 11/03/23  POCT rapid strep A  Result Value Ref Range   Rapid Strep A Screen Negative Negative        Assessment and Plan    Hyperlipidemia Patient was previously on 10mg  of statin therapy, but was recently increased to 40mg  by another provider. Patient has self-adjusted to 20mg  due to concerns about tolerability. Last LDL was 92 in December. -Continue 20mg  statin therapy. -Discuss with primary care provider for further adjustment and monitoring.  In the setting of: Peripheral Artery Disease (PAD) Patient reports small veins and absence of pulse in ankle. -Referral to vascular surgeon recommended. Scoliosis Patient reports severe scoliosis. -Continue current management plan. Leukemia Patient reports history of leukemia, currently under the care of an oncologist. -Continue current management plan. Obstructive Airway Disease Noted on recent CT scan. -Continue current management plan.  Pharyngitis acute Patient presents with a  scratchy throat. No signs of bacterial infection observed. -Recommend warm salt gargle, hot tea with honey, increased fluid intake, and soft diet. -Consider over-the-counter pain relief if necessary. -Continue current management plan for nasal congestion. Consider nasal saline rinse or Flonase if necessary.   Menopausal Symptoms Patient reports hot flashes and sweating. -Discuss with primary care provider or OBGYN specializing in menopause for further management.  General Health Maintenance -Continue current management plan for cyst in right lung. -Continue  current management plan for hepatomegaly. -Continue current management plan for high white blood cell count. -Continue current management plan for cataracts. -Continue current management plan for "retinal fluid accumulation."/left >right   Orders Placed This Encounter  Procedures   POCT rapid strep A    No follow-ups on file.   The patient was advised to call back or seek an in-person evaluation if the symptoms worsen or if the condition fails to improve as anticipated.  I discussed the assessment and treatment plan with the patient. The patient was provided an opportunity to ask questions and all were answered. The patient agreed with the plan and demonstrated an understanding of the instructions.  I, Debera Lat, PA-C have reviewed all documentation for this visit. The documentation on 11/03/2023  for the exam, diagnosis, procedures, and orders are all accurate and complete.  Debera Lat, [facility], MMS [facility] [phone] (phone) [phone] (fax)  [facility]

## 2023-11-04 ENCOUNTER — Encounter: Payer: Self-pay | Admitting: Physician Assistant

## 2023-11-04 ENCOUNTER — Ambulatory Visit: Payer: Medicare Other | Attending: Cardiovascular Disease | Admitting: Cardiovascular Disease

## 2023-11-04 ENCOUNTER — Encounter: Payer: Self-pay | Admitting: Cardiovascular Disease

## 2023-11-04 ENCOUNTER — Encounter: Payer: Self-pay | Admitting: Family Medicine

## 2023-11-04 VITALS — BP 132/84 | HR 78 | Ht 67.0 in | Wt 204.4 lb

## 2023-11-04 DIAGNOSIS — R0989 Other specified symptoms and signs involving the circulatory and respiratory systems: Secondary | ICD-10-CM | POA: Diagnosis not present

## 2023-11-04 DIAGNOSIS — E7800 Pure hypercholesterolemia, unspecified: Secondary | ICD-10-CM | POA: Diagnosis present

## 2023-11-04 DIAGNOSIS — I10 Essential (primary) hypertension: Secondary | ICD-10-CM | POA: Insufficient documentation

## 2023-11-04 NOTE — Assessment & Plan Note (Signed)
 Dr. Victorino Dike referred patient because of decreased pulses in her feet and a small punctate purpleish discoloration on the tip of her left second toe.  Her feet are somewhat cool to touch.  She has diminished but palpable pedal pulses on exam.  She denies claudication.  I am going to get ABIs to further evaluate.

## 2023-11-04 NOTE — Patient Instructions (Signed)
 Medication Instructions:  Your physician recommends that you continue on your current medications as directed. Please refer to the Current Medication list given to you today.  *If you need a refill on your cardiac medications before your next appointment, please call your pharmacy*   Testing/Procedures: Your physician has requested that you have an ankle brachial index (ABI). During this test an ultrasound and blood pressure cuff are used to evaluate the arteries that supply the arms and legs with blood. Allow thirty minutes for this exam. There are no restrictions or special instructions. This will take place at [address] [facility], [address].    Please note: We ask at that you not bring children with you during ultrasound (echo/ vascular) testing. Due to room size and safety concerns, children are not allowed in the ultrasound rooms during exams. Our front office staff cannot provide observation of children in our lobby area while testing is being conducted. An adult accompanying a patient to their appointment will only be allowed in the ultrasound room at the discretion of the ultrasound technician under special circumstances. We apologize for any inconvenience.    Follow-Up: At [facility], you and your health needs are our priority.  As part of our continuing mission to provide you with exceptional heart care, we have created designated Provider Care Teams.  These Care Teams include your primary Cardiologist (physician) and Advanced Practice Providers (APPs -  Physician Assistants and Nurse Practitioners) who all work together to provide you with the care you need, when you need it.  We recommend signing up for the patient portal called "MyChart".  Sign up information is provided on this After Visit Summary.  MyChart is used to connect with patients for Virtual Visits (Telemedicine).  Patients are able to view lab/test results, encounter notes, upcoming appointments, etc.  Non-urgent  messages can be sent to your provider as well.   To learn more about what you can do with MyChart, go to [email or web address].    Your next appointment:   We will see you on an as needed basis   Provider:   Nanetta Batty, MD     Other Instructions

## 2023-11-04 NOTE — Assessment & Plan Note (Signed)
 History of hyperlipidemia on high-dose rosuvastatin with lipid profile performed 09/11/2023 revealing total cholesterol 169, LDL 92 and HDL 53.

## 2023-11-04 NOTE — Assessment & Plan Note (Signed)
 History of essential hypertension her blood pressure measured today at 132/84.  She is on hydrochlorothiazide.

## 2023-11-04 NOTE — Progress Notes (Signed)
 11/04/2023 Natalie Eaton   1955/10/13  [id number]  Primary Physician Pardue, Monico Blitz, DO Primary Cardiologist: Runell Gess MD Roseanne Reno  HPI:  Natalie Eaton is a 68 y.o. moderately overweight widowed Caucasian female mother of 2, grandmother of 5 grandchildren who is retired from being a Chartered certified accountant, truck Engineer, structural.  She was referred by Dr. Victorino Dike, orthopedic surgeon, for evaluation of decreased pedal pulses and a small purpleish discoloration on the tip of her left second toe.  Her risk factors include remote tobacco abuse having quit 15 years ago.  She has treated hypertension and hyperlipidemia.  There is no family history for heart disease.  She is never had a heart attack or stroke.  She denies chest pain or shortness of breath.  She is minimally active because of scoliosis.  She apparently had a heart catheterization in [place] 8 to 10 years ago which was clean.  She denies claudication.   Current Meds  Medication Sig   Albuterol-Budesonide (AIRSUPRA) 90-80 MCG/ACT AERO Inhale 2 puffs into the lungs every 4 (four) hours as needed.   aspirin 81 MG chewable tablet Chew 81 mg by mouth daily.   celecoxib (CELEBREX) 200 MG capsule TAKE 1 CAPSULE BY MOUTH TWICE A DAY   gabapentin (NEURONTIN) 300 MG capsule TAKE 1 CAPSULE BY MOUTH THREE TIMES A DAY   hydrochlorothiazide (HYDRODIURIL) 25 MG tablet Take 1 tablet (25 mg total) by mouth daily.   levothyroxine (SYNTHROID) 75 MCG tablet TAKE 1 TABLET BY MOUTH EVERY DAY   methocarbamol (ROBAXIN) 750 MG tablet Take 1 tablet (750 mg total) by mouth 2 (two) times daily as needed for muscle spasms.   Multiple Vitamin (MULTIVITAMIN ADULT PO) Take 1 each by mouth daily at 2 PM.   pantoprazole (PROTONIX) 40 MG tablet TAKE 1 TABLET BY MOUTH EVERY DAY   potassium chloride (MICRO-K) 10 MEQ CR capsule Take 1 capsule (10 mEq total) by mouth 2 (two) times daily.   rosuvastatin (CRESTOR) 40 MG tablet Take 1 tablet (40 mg  total) by mouth daily.   traMADol (ULTRAM) 50 MG tablet Take 50 mg by mouth 3 (three) times daily.   traZODone (DESYREL) 100 MG tablet Take 1 tablet (100 mg total) by mouth at bedtime.   valACYclovir (VALTREX) 500 MG tablet Take 1 tablet (500 mg total) by mouth daily. Or BID for 3 days prn sx     Allergies  Allergen Reactions   Codeine Other (See Comments)    Tingling on her scalp.    Social History   Socioeconomic History   Marital status: Widowed    Spouse name: Not on file   Number of children: 2   Years of education: Not on file   Highest education level: Associate degree: occupational, Scientist, product/process development, or vocational program  Occupational History   Not on file  Tobacco Use   Smoking status: Former    Current packs/day: 0.00    Average packs/day: 0.3 packs/day for 15.0 years (3.8 ttl pk-yrs)    Types: Cigarettes    Start date: 84    Quit date: 83    Years since quitting: 29.1   Smokeless tobacco: Never  Vaping Use   Vaping status: Never Used  Substance and Sexual Activity   Alcohol use: Yes    Alcohol/week: 1.0 standard drink of alcohol    Types: 1 Glasses of wine per week   Drug use: No   Sexual activity: Not Currently  Birth control/protection: None  Other Topics Concern   Not on file  Social History Narrative   Not on file   Social Drivers of Health   Financial Resource Strain: Medium Risk (11/03/2023)   Overall Financial Resource Strain (CARDIA)    Difficulty of Paying Living Expenses: Somewhat hard  Food Insecurity: Food Insecurity Present (11/03/2023)   Hunger Vital Sign    Worried About Running Out of Food in the Last Year: Sometimes true    Ran Out of Food in the Last Year: Sometimes true  Transportation Needs: No Transportation Needs (11/03/2023)   PRAPARE - Administrator, Civil Service (Medical): No    Lack of Transportation (Non-Medical): No  Physical Activity: Inactive (11/03/2023)   Exercise Vital Sign    Days of Exercise per Week: 0  days    Minutes of Exercise per Session: 30 min  Stress: No Stress Concern Present (11/03/2023)   [employer] of Occupational Health - Occupational Stress Questionnaire    Feeling of Stress : Not at all  Social Connections: Unknown (11/03/2023)   Social Connection and Isolation Panel [NHANES]    Frequency of Communication with Friends and Family: Twice a week    Frequency of Social Gatherings with Friends and Family: Patient declined    Attends Religious Services: More than 4 times per year    Active Member of [employer] or Organizations: Yes    Attends Banker Meetings: More than 4 times per year    Marital Status: Widowed  Intimate Partner Violence: Not At Risk (02/25/2023)   Humiliation, Afraid, Rape, and Kick questionnaire    Fear of Current or Ex-Partner: No    Emotionally Abused: No    Physically Abused: No    Sexually Abused: No     Review of Systems: General: negative for chills, fever, night sweats or weight changes.  Cardiovascular: negative for chest pain, dyspnea on exertion, edema, orthopnea, palpitations, paroxysmal nocturnal dyspnea or shortness of breath Dermatological: negative for rash Respiratory: negative for cough or wheezing Urologic: negative for hematuria Abdominal: negative for nausea, vomiting, diarrhea, bright red blood per rectum, melena, or hematemesis Neurologic: negative for visual changes, syncope, or dizziness All other systems reviewed and are otherwise negative except as noted above.    Blood pressure 132/84, pulse 78, height 5\' 7"  (1.702 m), weight 204 lb 6.4 oz (92.7 kg).  General appearance: alert and no distress Neck: no adenopathy, no carotid bruit, no JVD, supple, symmetrical, trachea midline, and thyroid not enlarged, symmetric, no tenderness/mass/nodules Lungs: clear to auscultation bilaterally Heart: regular rate and rhythm, S1, S2 normal, no murmur, click, rub or gallop Extremities: extremities normal, atraumatic, no  cyanosis or edema Pulses: Diminished pedal pulses bilaterally Skin: Skin color, texture, turgor normal. No rashes or lesions Neurologic: Grossly normal  EKG EKG Interpretation Date/Time:  Tuesday November 04 2023 14:11:30 EST Ventricular Rate:  78 PR Interval:  164 QRS Duration:  94 QT Interval:  392 QTC Calculation: 446 R Axis:   -74  Text Interpretation: Normal sinus rhythm Left axis deviation Low voltage QRS No previous ECGs available Confirmed by Nanetta Batty [phone]) on 11/04/2023 2:19:51 PM    ASSESSMENT AND PLAN:   Essential hypertension, benign History of essential hypertension her blood pressure measured today at 132/84.  She is on hydrochlorothiazide.  Pure hypercholesterolemia History of hyperlipidemia on high-dose rosuvastatin with lipid profile performed 09/11/2023 revealing total cholesterol 169, LDL 92 and HDL 53.  Decreased pulses in feet Dr. Victorino Dike referred patient because  of decreased pulses in her feet and a small punctate purpleish discoloration on the tip of her left second toe.  Her feet are somewhat cool to touch.  She has diminished but palpable pedal pulses on exam.  She denies claudication.  I am going to get ABIs to further evaluate.     Runell Gess MD FACP,FACC,FAHA, [facility] 11/04/2023 2:31 PM

## 2023-11-14 ENCOUNTER — Encounter (HOSPITAL_COMMUNITY): Payer: Medicare Other

## 2023-11-16 ENCOUNTER — Other Ambulatory Visit: Payer: Self-pay | Admitting: Family Medicine

## 2023-11-17 NOTE — Telephone Encounter (Signed)
 Last RF request patient make appointment- no increase in number at this time Requested Prescriptions  Pending Prescriptions Disp Refills   traZODone (DESYREL) 100 MG tablet [Pharmacy Med Name: TRAZODONE 100 MG TABLET] 90 tablet 1    Sig: TAKE 1 TABLET BY MOUTH EVERYDAY AT BEDTIME     Psychiatry: Antidepressants - Serotonin Modulator Passed - 11/17/2023  4:24 PM      Passed - Valid encounter within last 6 months    Recent Outpatient Visits           2 months ago Essential hypertension, benign   [facility] Jacky Kindle, FNP   8 months ago Pure hypercholesterolemia   [facility] Alfredia Ferguson, PA-C   1 year ago Pneumatocele of lung   [facility] Alfredia Ferguson, PA-C   1 year ago Flank pain   [facility] Merita Norton T, FNP   1 year ago Hypothyroidism, unspecified type   [facility] Alfredia Ferguson, [place]

## 2023-11-26 ENCOUNTER — Telehealth: Payer: Self-pay

## 2023-11-26 ENCOUNTER — Ambulatory Visit: Payer: Medicare Other | Attending: Cardiovascular Disease

## 2023-11-26 DIAGNOSIS — R0989 Other specified symptoms and signs involving the circulatory and respiratory systems: Secondary | ICD-10-CM | POA: Insufficient documentation

## 2023-11-26 LAB — VAS US ABI WITH/WO TBI
Left ABI: 1.17
Right ABI: 1.22

## 2023-11-26 NOTE — Telephone Encounter (Signed)
 Patient called triage line recommending Dr. Cathie Hoops as her new provider once Dr. Mervyn Skeeters leaves.

## 2023-11-29 ENCOUNTER — Other Ambulatory Visit: Payer: Self-pay | Admitting: Physician Assistant

## 2023-11-29 DIAGNOSIS — E876 Hypokalemia: Secondary | ICD-10-CM

## 2023-12-03 ENCOUNTER — Telehealth: Payer: Self-pay

## 2023-12-03 ENCOUNTER — Ambulatory Visit: Payer: Medicare Other | Admitting: Pulmonary Disease

## 2023-12-03 DIAGNOSIS — Z13820 Encounter for screening for osteoporosis: Secondary | ICD-10-CM

## 2023-12-03 DIAGNOSIS — M1990 Unspecified osteoarthritis, unspecified site: Secondary | ICD-10-CM

## 2023-12-03 NOTE — Telephone Encounter (Signed)
 Copied from CRM [id number]. Topic: Clinical - Request for Lab/Test Order >> Dec 03, 2023 10:59 AM Natalie Eaton wrote: Reason for CRM: pt would like a bone density test sent to [facility] so she can have mammogram and bone density test done same day on 12/23/2023. Pt callback [phone]

## 2023-12-03 NOTE — Telephone Encounter (Signed)
 Pt advised order placed and to contact [place] to see if both can be completed on the same day

## 2023-12-08 ENCOUNTER — Telehealth: Payer: Self-pay | Admitting: Family Medicine

## 2023-12-08 NOTE — Telephone Encounter (Signed)
 [facility] is requesting refill rosuvastatin (CRESTOR) 40 MG tablet  Please advise

## 2023-12-09 NOTE — Telephone Encounter (Signed)
 Medication was discontinued in December when patient stated she was not taking it any more due to side effect

## 2023-12-11 ENCOUNTER — Other Ambulatory Visit: Payer: Self-pay | Admitting: Family Medicine

## 2023-12-11 DIAGNOSIS — M1990 Unspecified osteoarthritis, unspecified site: Secondary | ICD-10-CM

## 2023-12-16 ENCOUNTER — Other Ambulatory Visit: Payer: Self-pay | Admitting: Physician Assistant

## 2023-12-16 ENCOUNTER — Other Ambulatory Visit: Payer: Self-pay | Admitting: Family Medicine

## 2023-12-16 DIAGNOSIS — M1990 Unspecified osteoarthritis, unspecified site: Secondary | ICD-10-CM

## 2023-12-16 NOTE — Telephone Encounter (Signed)
 Copied from CRM [id number]. Topic: Clinical - Medication Refill >> Dec 16, 2023 12:50 PM Antwanette L wrote: Most Recent Primary Care Visit:  Provider: Debera Lat  Department: [facility]  Visit Type: OFFICE VISIT  Date: 11/03/2023  Medication: celecoxib (CELEBREX) 200 MG capsule   Has the patient contacted their pharmacy? Yes   Is this the correct pharmacy for this prescription? [facility] [address] [place] [facility] [address] [place] [address] Phone: [phone] Fax: [phone]   Has the prescription been filled recently? No. Last refilled on 03/10/23  Is the patient out of the medication? Yes  Has the patient been seen for an appointment in the last year OR does the patient have an upcoming appointment? Yes  Can we respond through MyChart? Yes and by phone at [phone]  Agent: Please be advised that Rx refills may take up to 3 business days. We ask that you follow-up with your pharmacy.

## 2023-12-16 NOTE — Telephone Encounter (Unsigned)
 Copied from CRM [id number]. Topic: Clinical - Medication Refill >> Dec 16, 2023 12:50 PM Antwanette L wrote: Most Recent Primary Care Visit:  Provider: Debera Lat  Department: [facility]  Visit Type: OFFICE VISIT  Date: 11/03/2023  Medication: celecoxib (CELEBREX) 200 MG capsule   Has the patient contacted their pharmacy? Yes   Is this the correct pharmacy for this prescription? [facility] [address] [place] - [address] [place] [address] Phone: [phone] Fax: [phone]   Has the prescription been filled recently? No. Last refilled on 03/10/23  Is the patient out of the medication? Yes  Has the patient been seen for an appointment in the last year OR does the patient have an upcoming appointment? Yes  Can we respond through MyChart? Yes and by phone at [phone]  Agent: Please be advised that Rx refills may take up to 3 business days. We ask that you follow-up with your pharmacy. >> Dec 16, 2023 12:57 PM Antwanette L wrote: The patient said she is currently out the medicine and needs to pick it up today.

## 2023-12-18 MED ORDER — CELECOXIB 200 MG PO CAPS: 200.0000 mg | ORAL_CAPSULE | Freq: Two times a day (BID) | ORAL | 2 refills | Status: DC

## 2023-12-18 NOTE — Telephone Encounter (Signed)
 All criteria met.   LR 03/10/2023 #120, 0 refills.   LOV 11/03/2023 with Debera Lat, PA-C.   #60 with 2 refills given per note that pt is requesting this monthly.  Requested Prescriptions  Pending Prescriptions Disp Refills   celecoxib (CELEBREX) 200 MG capsule 120 capsule 0    Sig: Take 1 capsule (200 mg total) by mouth 2 (two) times daily.     Analgesics:  COX2 Inhibitors Failed - 12/18/2023 11:17 AM      Failed - Manual Review: Labs are only required if the patient has taken medication for more than 8 weeks.      Passed - HGB in normal range and within 360 days    Hemoglobin  Date Value Ref Range Status  10/27/2023 14.1 12.0 - 15.0 g/dL Final  78/29/5621 30.8 11.1 - 15.9 g/dL Final         Passed - Cr in normal range and within 360 days    Creat  Date Value Ref Range Status  07/04/2022 0.77 0.50 - 1.05 mg/dL Final   Creatinine, Ser  Date Value Ref Range Status  10/27/2023 0.73 0.44 - 1.00 mg/dL Final         Passed - HCT in normal range and within 360 days    HCT  Date Value Ref Range Status  10/27/2023 42.5 36.0 - 46.0 % Final   Hematocrit  Date Value Ref Range Status  09/11/2023 43.5 34.0 - 46.6 % Final         Passed - AST in normal range and within 360 days    AST  Date Value Ref Range Status  10/27/2023 23 15 - 41 U/L Final         Passed - ALT in normal range and within 360 days    ALT  Date Value Ref Range Status  10/27/2023 27 0 - 44 U/L Final         Passed - eGFR is 30 or above and within 360 days    GFR calc Af Amer  Date Value Ref Range Status  09/01/2020 105 >59 mL/min/1.73 Final    Comment:    **In accordance with recommendations from the NKF-ASN Task force,**   [facility] is in the process of updating its eGFR calculation to the   2021 CKD-EPI creatinine equation that estimates kidney function   without a race variable.    GFR, Estimated  Date Value Ref Range Status  10/27/2023 >60 >60 mL/min Final    Comment:    (NOTE) Calculated using  the CKD-EPI Creatinine Equation (2021)    eGFR  Date Value Ref Range Status  09/11/2023 77 >59 mL/min/1.73 Final         Passed - Patient is not pregnant      Passed - Valid encounter within last 12 months    Recent Outpatient Visits           1 month ago Sore throat   [facility], [place], PA-C   3 years ago Depression, unspecified depression type   [facility], Asencion Partridge, MD   3 years ago Leukocytosis, unspecified type   [facility], Asencion Partridge, MD   3 years ago Low back pain with bilateral sciatica, unspecified back pain laterality, unspecified chronicity   [facility], Asencion Partridge, MD   3 years ago Hot flushes, perimenopausal   [facility], Meda Coffee, MD

## 2023-12-23 ENCOUNTER — Encounter

## 2023-12-24 ENCOUNTER — Encounter: Payer: Self-pay | Admitting: Cardiovascular Disease

## 2023-12-30 ENCOUNTER — Other Ambulatory Visit: Payer: Self-pay | Admitting: Physician Assistant

## 2023-12-30 DIAGNOSIS — E7800 Pure hypercholesterolemia, unspecified: Secondary | ICD-10-CM

## 2024-01-06 ENCOUNTER — Ambulatory Visit: Payer: Medicare Other | Admitting: Internal Medicine

## 2024-01-06 ENCOUNTER — Other Ambulatory Visit: Payer: Medicare Other

## 2024-01-12 ENCOUNTER — Ambulatory Visit
Admission: RE | Admit: 2024-01-12 | Discharge: 2024-01-12 | Disposition: A | Discharge status: Home / Self Care | Source: Ambulatory Visit | Attending: Family Medicine | Admitting: Family Medicine

## 2024-01-12 DIAGNOSIS — M1990 Unspecified osteoarthritis, unspecified site: Secondary | ICD-10-CM | POA: Diagnosis present

## 2024-01-12 DIAGNOSIS — Z780 Asymptomatic menopausal state: Secondary | ICD-10-CM | POA: Insufficient documentation

## 2024-01-12 DIAGNOSIS — Z13820 Encounter for screening for osteoporosis: Secondary | ICD-10-CM | POA: Diagnosis present

## 2024-01-13 ENCOUNTER — Encounter: Payer: Self-pay | Admitting: Emergency Medicine

## 2024-01-13 ENCOUNTER — Ambulatory Visit: Attending: Emergency Medicine | Admitting: Emergency Medicine

## 2024-01-13 VITALS — BP 130/70 | HR 70 | Ht 66.0 in | Wt 214.8 lb

## 2024-01-13 DIAGNOSIS — C9110 Chronic lymphocytic leukemia of B-cell type not having achieved remission: Secondary | ICD-10-CM

## 2024-01-13 DIAGNOSIS — I10 Essential (primary) hypertension: Secondary | ICD-10-CM

## 2024-01-13 DIAGNOSIS — E782 Mixed hyperlipidemia: Secondary | ICD-10-CM

## 2024-01-13 DIAGNOSIS — R600 Localized edema: Secondary | ICD-10-CM

## 2024-01-13 MED ORDER — FUROSEMIDE 20 MG PO TABS: ORAL_TABLET | ORAL | 1 refills | Status: DC

## 2024-01-13 NOTE — Progress Notes (Signed)
 Cardiology Office Note:    Date:  01/13/2024  ID:  Natalie Eaton, DOB January 02, 1956, MRN [id number] PCP: Carlean Charter, DO  [facility] Providers Cardiologist:  Lauro Portal, MD       Patient Profile:      Chief Complaint: Acute visit for lower extremity swelling History of Present Illness:  Natalie Eaton is a 68 y.o. female with visit-pertinent history of former tobacco abuse, hypertension, hyperlipidemia, hypothyroidism, mitral valve prolapse  She apparently had a heart catheterization in [place]  8 to 10 years ago which was normal.  She established with cardiology service on 06/07/2020 with Dr. Marven Slimmer for evaluation of palpitations.  ZIO 06/21/2020 showed 6 episodes of SVT, longest episode lasting 14 beats.  Echocardiogram 07/21/2020 showed LVEF 60 to 65%, no RWMA, grade 1 DD, RV function and size normal, trivial MR.  She was to follow-up on as-needed basis.  She had not been seen in over 3 years and once again established with cardiology service with Dr. Katheryne Pane on 11/04/2023 due to decreased pulses in feet.  On exam she had diminished but palpable pedal pulses.  She denies any claudication.  She had a small punctate purpleish discoloration on the tip of her left second toe.  ABIs on 11/26/2023 are essentially normal, to repeat only when clinically indicated.  She called into nurse triage line on 12/24/2023 with complaints of swelling and weight gain she was scheduled for OV.   Discussed the use of AI scribe software for clinical note transcription with the patient, who gave verbal consent to proceed.  History of Present Illness Natalie Eaton is a 68 year old female who presents with worsening edema and weight fluctuations.  She experiences significant bilateral lower extremity swelling, primarily in her left ankle/foot. She notes her lower extremity swelling has been going on for several years that tends to improve in the mornings and worsens throughout the day.  However over the  past month her lower extremity swelling no longer improves overnight and has been staying consistent.  L > R.   She takes hydrochlorothiazide  for fluid management but finds it ineffective. She recalls Lasix being more effective and used it in the past as needed for significant weight gain or swelling.   Her weight fluctuates between 205 and 215 pounds without a clear explanation. She has attempted dietary modifications, such as reducing salt intake and eating more salads, to manage her weight and swelling.  No shortness of breath, PND, orthopnea or chest pain is present. She reports a general feeling of weakness in her legs but denies any claudication.  Review of systems:  Please see the history of present illness. All other systems are reviewed and otherwise negative.     Home Medications:    Current Meds  Medication Sig   Albuterol -Budesonide (AIRSUPRA ) 90-80 MCG/ACT AERO Inhale 2 puffs into the lungs every 4 (four) hours as needed.   aspirin 81 MG chewable tablet Chew 81 mg by mouth daily.   celecoxib  (CELEBREX ) 200 MG capsule Take 1 capsule (200 mg total) by mouth 2 (two) times daily.   furosemide (LASIX) 20 MG tablet TAKE 20 MG AS NEEDED FOR WEIGHT GAIN OF 3 POUNDS IN ONE NIGHT OR WEIGHT GAIN OF 5 POUNDS IN ONE WEEK.   gabapentin  (NEURONTIN ) 300 MG capsule TAKE 1 CAPSULE BY MOUTH THREE TIMES A DAY   hydrochlorothiazide  (HYDRODIURIL ) 25 MG tablet Take 1 tablet (25 mg total) by mouth daily.   HYDROcodone-acetaminophen (NORCO) 10-325 MG tablet Take  1 tablet by mouth in the morning, at noon, in the evening, and at bedtime.   levothyroxine  (SYNTHROID ) 75 MCG tablet TAKE 1 TABLET BY MOUTH EVERY DAY   methocarbamol  (ROBAXIN ) 750 MG tablet Take 1 tablet (750 mg total) by mouth 2 (two) times daily as needed for muscle spasms.   Multiple Vitamin (MULTIVITAMIN ADULT PO) Take 1 each by mouth daily at 2 PM.   pantoprazole  (PROTONIX ) 40 MG tablet TAKE 1 TABLET BY MOUTH EVERY DAY (Patient taking  differently: Take 40 mg by mouth as needed.)   potassium chloride  (MICRO-K ) 10 MEQ CR capsule TAKE 1 CAPSULE BY MOUTH 2 TIMES DAILY.   rosuvastatin  (CRESTOR ) 10 MG tablet Take 1 tablet (10 mg total) by mouth daily.   traZODone  (DESYREL ) 100 MG tablet Take 1 tablet (100 mg total) by mouth at bedtime.   valACYclovir  (VALTREX ) 500 MG tablet Take 1 tablet (500 mg total) by mouth daily. Or BID for 3 days prn sx   Studies Reviewed:       VAS US  ABI 11/26/2023 Right: Resting right ankle-brachial index is within normal range. The  right toe-brachial index is abnormal.   Left: Resting left ankle-brachial index is within normal range. The left  toe-brachial index is abnormal.   Echocardiogram 07/21/2020 1. Left ventricular ejection fraction, by estimation, is 60 to 65%. The  left ventricle has normal function. The left ventricle has no regional  wall motion abnormalities. Left ventricular diastolic parameters are  consistent with Grade I diastolic  dysfunction (impaired relaxation).   2. Right ventricular systolic function is normal. The right ventricular  size is normal.   3. The mitral valve is normal in structure. Trivial mitral valve  regurgitation. No evidence of mitral stenosis.   4. The aortic valve is tricuspid. Aortic valve regurgitation is not  visualized. No aortic stenosis is present.   5. The inferior vena cava is normal in size with greater than 50%  respiratory variability, suggesting right atrial pressure of 3 mmHg.  Risk Assessment/Calculations:             Physical Exam:   VS:  BP 130/70 (BP Location: Left Arm, Patient Position: Sitting, Cuff Size: Normal)   Pulse 70   Ht 5\' 6"  (1.676 m)   Wt 214 lb 12.8 oz (97.4 kg)   SpO2 100%   BMI 34.67 kg/m    Wt Readings from Last 3 Encounters:  01/13/24 214 lb 12.8 oz (97.4 kg)  11/04/23 204 lb 6.4 oz (92.7 kg)  11/03/23 205 lb (93 kg)    GEN: Well nourished, well developed in no acute distress NECK: No JVD; No carotid  bruits CARDIAC: RRR, no murmurs, rubs, gallops RESPIRATORY:  Clear to auscultation without rales, wheezing or rhonchi  ABDOMEN: Soft, non-tender, non-distended EXTREMITIES: Trace pedal edema to the right and 1+ pedal edema to the left; No acute deformity     Assessment and Plan:  Lower extremity swelling She notes chronic lower extremity swelling over the past several years that has improved with elevation.  However, over the past month her lower extremity swelling has become persistent primarily in her bilateral ankles/feet which no longer improves with elevation and has been persisting throughout the night.  Left > right. 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 Awaiting knee replacement consultation in June - She is without chest pains, dyspnea, orthopnea, PND - Echocardiogram ordered to reevaluate LV function - Start Lasix 20 mg as needed  for weight gain of 3 pounds in 1 day or 5 pounds in 1 week.  She is to take extra dose of her potassium supplementation on days she takes her Lasix - Bilateral DVT ultrasound ordered - Advised on wearing compression stockings  Hypertension Blood pressure today is 130/70 and under adequate control - Continue hydrochlorothiazide  25 mg daily  Hyperlipidemia LDL 92, TG 137, TC 169, HDL 53 on 08/2023 LDL is adequate and under goal less than 100 - Continue rosuvastatin  10 mg daily  Chronic lymphocytic leukemia - Managed by oncology      Dispo:  Return in about 1 month (around 02/12/2024).  Signed, Ava Boatman, NP

## 2024-01-13 NOTE — Patient Instructions (Addendum)
 Medication Instructions:  START TAKING LASIX 20 MG AS NEEDED FOR WEIGHT GAIN OF 3 POUNDS IN ONE NIGHT OR WEIGHT GAIN OF 5 POUNDS IN ONE WEEK. DOUBLE UP ON YOUR POTASSIUM CHLORIDE  WHEN TAKING LASIX.   Lab Work: NONE   Testing/Procedures: Your physician has requested that you have a lower or upper extremity venous duplex. This test is an ultrasound of the veins in the legs or arms. It looks at venous blood flow that carries blood from the heart to the legs or arms. Allow one hour for a Lower Venous exam. Allow thirty minutes for an Upper Venous exam. There are no restrictions or special instructions.  Please note: We ask at that you not bring children with you during ultrasound (echo/ vascular) testing. Due to room size and safety concerns, children are not allowed in the ultrasound rooms during exams. Our front office staff cannot provide observation of children in our lobby area while testing is being conducted. An adult accompanying a patient to their appointment will only be allowed in the ultrasound room at the discretion of the ultrasound technician under special circumstances. We apologize for any inconvenience.   Please note: We ask at that you not bring children with you during ultrasound (echo/ vascular) testing. Due to room size and safety concerns, children are not allowed in the ultrasound rooms during exams. Our front office staff cannot provide observation of children in our lobby area while testing is being conducted. An adult accompanying a patient to their appointment will only be allowed in the ultrasound room at the discretion of the ultrasound technician under special circumstances. We apologize for any inconvenience.   Your physician has requested that you have an echocardiogram. Echocardiography is a painless test that uses sound waves to create images of your heart. It provides your doctor with information about the size and shape of your heart and how well your heart's chambers  and valves are working. This procedure takes approximately one hour. There are no restrictions for this procedure. Please do NOT wear cologne, perfume, aftershave, or lotions (deodorant is allowed). Please arrive 15 minutes prior to your appointment time.  Please note: We ask at that you not bring children with you during ultrasound (echo/ vascular) testing. Due to room size and safety concerns, children are not allowed in the ultrasound rooms during exams. Our front office staff cannot provide observation of children in our lobby area while testing is being conducted. An adult accompanying a patient to their appointment will only be allowed in the ultrasound room at the discretion of the ultrasound technician under special circumstances. We apologize for any inconvenience.    Follow-Up: At [facility], you and your health needs are our priority.  As part of our continuing mission to provide you with exceptional heart care, our providers are all part of one team.  This team includes your primary Cardiologist (physician) and Advanced Practice Providers or APPs (Physician Assistants and Nurse Practitioners) who all work together to provide you with the care you need, when you need it.  Your next appointment:   POST TESTING.  Provider:   MADISON FOUNTAIN, DNP  [facility].

## 2024-01-15 ENCOUNTER — Ambulatory Visit (HOSPITAL_COMMUNITY)
Admission: RE | Admit: 2024-01-15 | Discharge: 2024-01-15 | Disposition: A | Discharge status: Home / Self Care | Source: Ambulatory Visit | Attending: Emergency Medicine | Admitting: Emergency Medicine

## 2024-01-15 DIAGNOSIS — R600 Localized edema: Secondary | ICD-10-CM | POA: Insufficient documentation

## 2024-01-19 ENCOUNTER — Ambulatory Visit (INDEPENDENT_AMBULATORY_CARE_PROVIDER_SITE_OTHER): Admitting: Family Medicine

## 2024-01-19 ENCOUNTER — Ambulatory Visit: Admitting: Family Medicine

## 2024-01-19 ENCOUNTER — Encounter: Payer: Self-pay | Admitting: Family Medicine

## 2024-01-19 VITALS — BP 124/81 | HR 80 | Ht 66.0 in | Wt 213.0 lb

## 2024-01-19 DIAGNOSIS — H938X1 Other specified disorders of right ear: Secondary | ICD-10-CM | POA: Diagnosis not present

## 2024-01-19 MED ORDER — FLUTICASONE PROPIONATE 50 MCG/ACT NA SUSP: 2.0000 | Freq: Every day | NASAL | 6 refills | Status: DC

## 2024-01-19 NOTE — Progress Notes (Signed)
 ACUTE PATIENT VISIT    Patient: Natalie Eaton   DOB: 1955-10-16   68 y.o. Female  MRN: [id number] Visit Date: 01/19/2024  Today's healthcare provider: Mimi Alt, MD   PCP: Carlean Charter, DO   Chief Complaint  Patient presents with   Ear Problem    She can hardly hear of her L ear     Subjective     HPI     Ear Problem    Additional comments: She can hardly hear of her L ear       Last edited by Bart Lieu, CMA on 01/19/2024  3:51 PM.       Discussed the use of AI scribe software for clinical note transcription with the patient, who gave verbal consent to proceed.  History of Present Illness          Discussed the use of AI scribe software for clinical note transcription with the patient, who gave verbal consent to proceed.  History of Present Illness Natalie Eaton is a 68 year old female who presents with a sensation of ear fullness in the right ear.  She experiences a sensation of fullness in her right ear, described as feeling 'closed up', which has persisted since a visit to a physician assistant a few months ago. She has attempted to alleviate the sensation using peroxide and earwax cleaner without relief. The left ear is unaffected, and her hearing remains good in that ear.  No recent ear infections, sinus infections, colds, congestion, or trauma to the head or ear. The sensation is likened to having a finger holding her ear closed, with temporary relief upon pulling the ear. No muffled sounds, water-like sensations, or sore throat are present.  Her medical history includes lymphoma, for which she is under regular monitoring with blood tests every four to six months to check her white blood cell count. She has not started any treatment for this condition yet, as her previous oncologist had a plan but left, and she is awaiting a new oncologist.  She is not currently using any nasal sprays like Flonase and has not been using Benadryl  recently. She mentions having Nasacort and Tamiflu at home, but it is unclear if she is using them currently.   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   aspirin 81 MG chewable tablet Chew 81 mg by mouth daily.   celecoxib  (CELEBREX ) 200 MG capsule Take 1 capsule (200 mg total) by mouth 2 (two) times daily.   furosemide  (LASIX ) 20 MG tablet TAKE 20 MG AS NEEDED FOR WEIGHT GAIN OF 3 POUNDS IN ONE NIGHT OR WEIGHT GAIN OF 5 POUNDS IN ONE WEEK.   gabapentin  (NEURONTIN ) 300 MG capsule TAKE 1 CAPSULE BY MOUTH THREE TIMES A DAY   hydrochlorothiazide  (HYDRODIURIL )  25 MG tablet Take 1 tablet (25 mg total) by mouth daily.   HYDROcodone-acetaminophen (NORCO) 10-325 MG tablet Take 1 tablet by mouth in the morning, at noon, in the evening, and at bedtime.   levothyroxine  (SYNTHROID ) 75 MCG tablet TAKE 1 TABLET BY MOUTH EVERY DAY   methocarbamol  (ROBAXIN ) 750 MG tablet Take 1 tablet (750 mg total) by mouth 2 (two) times daily as needed for muscle spasms.   Multiple Vitamin (MULTIVITAMIN ADULT PO) Take 1 each by mouth daily at 2 PM.   pantoprazole  (PROTONIX ) 40 MG tablet TAKE 1 TABLET BY MOUTH EVERY DAY (Patient taking differently: Take 40 mg by mouth as needed.)   potassium chloride  (MICRO-K ) 10 MEQ CR capsule TAKE 1 CAPSULE BY MOUTH 2 TIMES DAILY.   rosuvastatin  (CRESTOR ) 10 MG tablet Take 1 tablet (10 mg total) by mouth daily.   traMADol (ULTRAM) 50 MG tablet  Take 50 mg by mouth 3 (three) times daily.   traZODone  (DESYREL ) 100 MG tablet Take 1 tablet (100 mg total) by mouth at bedtime.   valACYclovir  (VALTREX ) 500 MG tablet Take 1 tablet (500 mg total) by mouth daily. Or BID for 3 days prn sx   No facility-administered medications prior to visit.    Review of Systems      Objective    BP 124/81   Pulse 80   Ht 5\' 6"  (1.676 m)   Wt 213 lb (96.6 kg)   SpO2 100%   BMI 34.38 kg/m  BP Readings from Last 3 Encounters:  01/19/24 124/81  01/13/24 130/70  11/04/23 132/84   Wt Readings from Last 3 Encounters:  01/19/24 213 lb (96.6 kg)  01/13/24 214 lb 12.8 oz (97.4 kg)  11/04/23 204 lb 6.4 oz (92.7 kg)        Physical Exam  Physical Exam HEENT: Ear canals clean bilaterally. No effusion behind tympanic membranes bilaterally.    No results found for any visits on 01/19/24.  Assessment & Plan     Problem List Items Addressed This Visit   None Visit Diagnoses       Sensation of fullness in right ear    -  Primary   Relevant Medications   fluticasone (FLONASE) 50 MCG/ACT nasal spray   Other Relevant Orders   Ambulatory referral to ENT       Assessment & Plan Sensation of ear fullness Reports sensation of fullness in the right ear persisting since a visit with a PA a few months ago. No history of ear infection, sinus infection, cold, congestion, trauma, or sore throat. Examination reveals a clean ear canal with no effusion behind the eardrum. Sensation may be related to Eustachian tube dysfunction. Does not currently use intranasal corticosteroids like Flonase. Suggests trying diphenhydramine to address potential residual congestion, although she does not fit the typical profile for this treatment. Referral to an otolaryngologist is recommended for further evaluation. - Refer to otolaryngologist for further evaluation of ear fullness. - Prescribe Flonase, two sprays per nostril daily. - Recommend diphenhydramine 25 mg at  bedtime for 3-4 nights to address potential congestion.  Lymphoma Under surveillance with regular white blood cell count checks every 4-6 months. No active treatment plan at this time, but a previous oncologist had a treatment plan ready if needed. Scheduled to see a new oncologist in June.   Return if symptoms worsen or fail to improve.         Mimi Alt, MD  [facility] [phone] (phone) [phone] (fax)  [facility]

## 2024-01-20 ENCOUNTER — Ambulatory Visit: Admitting: Family Medicine

## 2024-01-24 ENCOUNTER — Other Ambulatory Visit: Payer: Self-pay | Admitting: Family Medicine

## 2024-01-24 DIAGNOSIS — G8929 Other chronic pain: Secondary | ICD-10-CM

## 2024-02-02 ENCOUNTER — Telehealth: Payer: Self-pay | Admitting: Family Medicine

## 2024-02-02 DIAGNOSIS — R1013 Epigastric pain: Secondary | ICD-10-CM

## 2024-02-02 DIAGNOSIS — C9110 Chronic lymphocytic leukemia of B-cell type not having achieved remission: Secondary | ICD-10-CM

## 2024-02-02 NOTE — Telephone Encounter (Signed)
 Copied from CRM [id number]. Topic: Referral - Request for Referral >> Feb 02, 2024  2:10 PM Rosaria Common wrote: Did the patient discuss referral with their provider in the last year? Yes (If No - schedule appointment) (If Yes - send message)  Appointment offered? Yes  Type of order/referral and detailed reason for visit: GI  Preference of office, provider, location: [place]  If referral order, have you been seen by this specialty before? Yes (If Yes, this issue or another issue? When? Where? Endoscopy completed  Can we respond through MyChart? No  Previous request has been sent and pt cancelled appt due to feeling better. Pt stated that previous location referred to is no longer accepting new pt. Pt has since begin to feel the pain again and would like another referral to another location.

## 2024-02-04 NOTE — Addendum Note (Signed)
 Addended by: Judyann Number on: 02/04/2024 01:04 PM   Modules accepted: Orders

## 2024-02-16 ENCOUNTER — Encounter: Payer: Self-pay | Admitting: Physician Assistant

## 2024-02-16 ENCOUNTER — Ambulatory Visit: Admitting: Physician Assistant

## 2024-02-16 ENCOUNTER — Ambulatory Visit (INDEPENDENT_AMBULATORY_CARE_PROVIDER_SITE_OTHER): Admitting: Physician Assistant

## 2024-02-16 VITALS — BP 136/84 | HR 82 | Ht 66.0 in | Wt 215.4 lb

## 2024-02-16 DIAGNOSIS — R1013 Epigastric pain: Secondary | ICD-10-CM

## 2024-02-16 DIAGNOSIS — R140 Abdominal distension (gaseous): Secondary | ICD-10-CM

## 2024-02-16 DIAGNOSIS — R739 Hyperglycemia, unspecified: Secondary | ICD-10-CM | POA: Diagnosis not present

## 2024-02-16 LAB — LIPASE: Lipase: 28 U/L (ref 11.0–59.0)

## 2024-02-16 LAB — COMPREHENSIVE METABOLIC PANEL WITH GFR
ALT: 19 U/L (ref 0–35)
AST: 16 U/L (ref 0–37)
Albumin: 4.4 g/dL (ref 3.5–5.2)
Alkaline Phosphatase: 56 U/L (ref 39–117)
BUN: 16 mg/dL (ref 6–23)
CO2: 31 meq/L (ref 19–32)
Calcium: 9.9 mg/dL (ref 8.4–10.5)
Chloride: 104 meq/L (ref 96–112)
Creatinine, Ser: 0.77 mg/dL (ref 0.40–1.20)
GFR: 79.04 mL/min (ref >60.00)
Glucose, Bld: 106 mg/dL — ABNORMAL HIGH (ref 70–99)
Potassium: 4.7 meq/L (ref 3.5–5.1)
Sodium: 142 meq/L (ref 135–145)
Total Bilirubin: 0.4 mg/dL (ref 0.2–1.2)
Total Protein: 6.4 g/dL (ref 6.0–8.3)

## 2024-02-16 LAB — HEMOGLOBIN A1C: Hgb A1c MFr Bld: 6.4 % (ref 4.6–6.5)

## 2024-02-16 LAB — CBC WITH DIFFERENTIAL/PLATELET
Basophils Absolute: 0 10*3/uL (ref 0.0–0.1)
Basophils Relative: 0.4 % (ref 0.0–3.0)
Eosinophils Absolute: 0.3 10*3/uL (ref 0.0–0.7)
Eosinophils Relative: 2.1 % (ref 0.0–5.0)
HCT: 41 % (ref 36.0–46.0)
Hemoglobin: 13.4 g/dL (ref 12.0–15.0)
Lymphocytes Relative: 49.7 % — ABNORMAL HIGH (ref 12.0–46.0)
Lymphs Abs: 6.5 10*3/uL — ABNORMAL HIGH (ref 0.7–4.0)
MCHC: 32.7 g/dL (ref 30.0–36.0)
MCV: 94.1 fl (ref 78.0–100.0)
Monocytes Absolute: 0.6 10*3/uL (ref 0.1–1.0)
Monocytes Relative: 4.4 % (ref 3.0–12.0)
Neutro Abs: 5.7 10*3/uL (ref 1.4–7.7)
Neutrophils Relative %: 43.4 % (ref 43.0–77.0)
Platelets: 209 10*3/uL (ref 150.0–400.0)
RBC: 4.35 Mil/uL (ref 3.87–5.11)
RDW: 14.1 % (ref 11.5–15.5)
WBC: 13 10*3/uL — ABNORMAL HIGH (ref 4.0–10.5)

## 2024-02-16 NOTE — Progress Notes (Signed)
 New patient visit   Patient: Natalie Eaton   DOB: 16-Nov-1955   68 y.o. Female  MRN: [id number] Visit Date: 02/16/2024  Today's healthcare provider: Trenton Frock, PA-C   Cc. Abdominal pain  Subjective    Natalie Eaton is a 68 y.o. female who presents today as a new patient to establish care.   Discussed the use of AI scribe software for clinical note transcription with the patient, who gave verbal consent to proceed.  History of Present Illness   Natalie Eaton is a 68 year old female with leukemia who presents with abdominal pain and bloating.  She experiences a tight band feeling high in the abdomen for the past few months, with persistent pain despite protonix . Dietary changes, such as a gluten-free diet, provide some relief, but fruit and lactose-free ice cream cause pain and nausea. There are no changes in bowel movements, blood in stool, vomiting, or acid reflux. Over-the-counter medications like MyLanta offer some relief but require frequent use. Pantoprazole  is currently prescribed but is not sufficiently effective.      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, per gyn   Hypertension    Hypothyroidism    Insomnia    Leukemia (HCC)    MVP (mitral valve prolapse)    Pneumonia    Pre-diabetes    Past Surgical History:  Procedure Laterality Date   ABDOMINAL HYSTERECTOMY     BLADDER SURGERY     CARDIAC CATHETERIZATION     GALLBLADDER SURGERY     LUNG REMOVAL, PARTIAL     ROTATOR CUFF REPAIR     TONSILLECTOMY     TUBAL LIGATION     Family Status  Relation Name Status   Mother  Alive   Father  (Not Specified)   Sister  Alive   Brother  Alive   Daughter  Alive   Son  Alive  No partnership data on file   Family History  Problem  Relation Age of Onset   Healthy Mother    Scoliosis Mother    Heart disease Father    Heart disease Brother    Healthy Daughter    Healthy Son    Social History   Socioeconomic History   Marital status: Widowed    Spouse name: Not on file   Number of children: 2   Years of education: Not on file   Highest education level: Associate degree: occupational, Scientist, product/process development, or vocational program  Occupational History   Not on file  Tobacco Use   Smoking status: Former    Current packs/day: 0.00    Average packs/day: 0.3 packs/day for 15.0 years (3.8 ttl pk-yrs)    Types: Cigarettes    Start date: 49    Quit date: 66    Years since quitting: 29.4   Smokeless tobacco: Never  Vaping Use   Vaping status: Never Used  Substance and Sexual Activity   Alcohol use: Yes    Alcohol/week: 1.0 standard drink of alcohol    Types: 1 Glasses of wine per week   Drug use: No   Sexual activity: Not Currently    Birth control/protection: None  Other Topics Concern  Not on file  Social History Narrative   Not on file   Social Drivers of Health   Financial Resource Strain: Medium Risk (11/03/2023)   Overall Financial Resource Strain (CARDIA)    Difficulty of Paying Living Expenses: Somewhat hard  Food Insecurity: Food Insecurity Present (11/03/2023)   Hunger Vital Sign    Worried About Running Out of Food in the Last Year: Sometimes true    Ran Out of Food in the Last Year: Sometimes true  Transportation Needs: No Transportation Needs (11/03/2023)   PRAPARE - Administrator, Civil Service (Medical): No    Lack of Transportation (Non-Medical): No  Physical Activity: Inactive (11/03/2023)   Exercise Vital Sign    Days of Exercise per Week: 0 days    Minutes of Exercise per Session: 30 min  Stress: No Stress Concern Present (11/03/2023)   [employer] of Occupational Health - Occupational Stress Questionnaire    Feeling of Stress : Not at all  Social Connections: Unknown  (11/03/2023)   Social Connection and Isolation Panel [NHANES]    Frequency of Communication with Friends and Family: Twice a week    Frequency of Social Gatherings with Friends and Family: Patient declined    Attends Religious Services: More than 4 times per year    Active Member of [employer] or Organizations: Yes    Attends Banker Meetings: More than 4 times per year    Marital Status: Widowed   Outpatient Medications Prior to Visit  Medication Sig   Albuterol -Budesonide (AIRSUPRA ) 90-80 MCG/ACT AERO Inhale 2 puffs into the lungs every 4 (four) hours as needed.   aspirin 81 MG chewable tablet Chew 81 mg by mouth daily.   celecoxib  (CELEBREX ) 200 MG capsule Take 1 capsule (200 mg total) by mouth 2 (two) times daily.   furosemide  (LASIX ) 20 MG tablet TAKE 20 MG AS NEEDED FOR WEIGHT GAIN OF 3 POUNDS IN ONE NIGHT OR WEIGHT GAIN OF 5 POUNDS IN ONE WEEK.   gabapentin  (NEURONTIN ) 300 MG capsule TAKE 1 CAPSULE BY MOUTH THREE TIMES A DAY   hydrochlorothiazide  (HYDRODIURIL ) 25 MG tablet Take 1 tablet (25 mg total) by mouth daily.   levothyroxine  (SYNTHROID ) 75 MCG tablet TAKE 1 TABLET BY MOUTH EVERY DAY   methocarbamol  (ROBAXIN ) 750 MG tablet Take 1 tablet (750 mg total) by mouth 2 (two) times daily as needed for muscle spasms.   Multiple Vitamin (MULTIVITAMIN ADULT PO) Take 1 each by mouth daily at 2 PM.   pantoprazole  (PROTONIX ) 40 MG tablet TAKE 1 TABLET BY MOUTH EVERY DAY (Patient taking differently: Take 40 mg by mouth as needed.)   potassium chloride  (MICRO-K ) 10 MEQ CR capsule TAKE 1 CAPSULE BY MOUTH 2 TIMES DAILY.   rosuvastatin  (CRESTOR ) 10 MG tablet Take 1 tablet (10 mg total) by mouth daily.   traMADol (ULTRAM) 50 MG tablet Take 50 mg by mouth 3 (three) times daily.   traZODone  (DESYREL ) 100 MG tablet Take 1 tablet (100 mg total) by mouth at bedtime.   valACYclovir  (VALTREX ) 500 MG tablet Take 1 tablet (500 mg total) by mouth daily. Or BID for 3 days prn sx   [DISCONTINUED]  fluticasone  (FLONASE ) 50 MCG/ACT nasal spray Place 2 sprays into both nostrils daily.   [DISCONTINUED] HYDROcodone-acetaminophen (NORCO) 10-325 MG tablet Take 1 tablet by mouth in the morning, at noon, in the evening, and at bedtime.   No facility-administered medications prior to visit.   Allergies  Allergen Reactions   Codeine Other (  See Comments)    Tingling on her scalp.    Immunization History  Administered Date(s) Administered   Influenza,inj,Quad PF,6+ Mos 08/21/2018, 06/10/2019   PFIZER(Purple Top)SARS-COV-2 Vaccination 12/09/2019, 01/01/2020, 07/04/2020   Pneumococcal Conjugate-13 06/02/2020   Pneumococcal-Unspecified 09/17/2019   Tdap 09/16/2018, 08/11/2020, 02/02/2022   Unspecified SARS-COV-2 Vaccination 12/14/2020    Health Maintenance  Topic Date Due   Zoster Vaccines- Shingrix (1 of 2) Never done   Pneumonia Vaccine 70+ Years old (2 of 2 - PPSV23) 07/28/2020   COVID-19 Vaccine (5 - 2024-25 season) 05/18/2023   MAMMOGRAM  06/14/2023   Medicare Annual Wellness (AWV)  02/25/2024   INFLUENZA VACCINE  04/16/2024   Fecal DNA (Cologuard)  09/22/2025   DEXA SCAN  01/11/2026   DTaP/Tdap/Td (4 - Td or Tdap) 02/03/2032   Hepatitis C Screening  Completed   HPV VACCINES  Aged Out   Meningococcal B Vaccine  Aged Out    Patient Care Team: Trenton Frock, PA-C as PCP - General (Physician Assistant) Avanell Leigh, MD as PCP - Cardiology (Cardiology) Timmy Forbes, MD as Consulting Physician (Oncology)  Review of Systems  Constitutional:  Negative for fatigue and fever.  Respiratory:  Negative for cough and shortness of breath.   Cardiovascular:  Negative for chest pain and leg swelling.  Gastrointestinal:  Positive for abdominal pain.  Neurological:  Negative for dizziness and headaches.        Objective    BP 136/84   Pulse 82   Ht 5\' 6"  (1.676 m)   Wt 215 lb 6.4 oz (97.7 kg)   BMI 34.77 kg/m    Physical Exam Constitutional:      General: She is awake.      Appearance: She is well-developed. She is obese.  HENT:     Head: Normocephalic.  Eyes:     Conjunctiva/sclera: Conjunctivae normal.  Cardiovascular:     Rate and Rhythm: Normal rate and regular rhythm.     Heart sounds: Normal heart sounds.  Pulmonary:     Effort: Pulmonary effort is normal.     Breath sounds: Normal breath sounds.  Abdominal:     Palpations: Abdomen is soft.     Tenderness: There is abdominal tenderness in the epigastric area.  Skin:    General: Skin is warm.  Neurological:     Mental Status: She is alert and oriented to person, place, and time.  Psychiatric:        Attention and Perception: Attention normal.        Mood and Affect: Mood normal.        Speech: Speech normal.        Behavior: Behavior is cooperative.    Depression Screen    01/19/2024    3:52 PM 02/25/2023   11:17 AM 10/15/2022    9:34 AM 09/20/2022    1:56 PM  PHQ 2/9 Scores  PHQ - 2 Score 0 0 0 0  PHQ- 9 Score 3  7 6    No results found for any visits on 02/16/24.  Assessment & Plan     Bloating -     CBC with Differential/Platelet -     Lipase -     Comprehensive metabolic panel with GFR -     Hemoglobin A1c -     Ambulatory referral to Gastroenterology -     H. pylori antigen, stool  Epigastric pain -     CBC with Differential/Platelet -     Lipase -  Comprehensive metabolic panel with GFR -     Hemoglobin A1c -     Ambulatory referral to Gastroenterology -     H. pylori antigen, stool  Hyperglycemia -     Hemoglobin A1c       - check cbc, cmp, lipase, a1c - Order stool test for H. pylori. - Refer to gastroenterology for further evaluation and potential endoscopy. - Continue food journal to identify triggers. - Consider increasing PPI pending labs    Return if symptoms worsen or fail to improve.      Trenton Frock, PA-C  [facility] at [facility] [phone] (phone) [phone] (fax)  [facility]

## 2024-02-17 ENCOUNTER — Encounter: Payer: Self-pay | Admitting: Physician Assistant

## 2024-02-18 ENCOUNTER — Ambulatory Visit (HOSPITAL_COMMUNITY)
Admission: RE | Admit: 2024-02-18 | Discharge: 2024-02-18 | Disposition: A | Discharge status: Home / Self Care | Source: Ambulatory Visit | Attending: Cardiology | Admitting: Cardiology

## 2024-02-18 ENCOUNTER — Ambulatory Visit: Payer: Self-pay | Admitting: Physician Assistant

## 2024-02-18 DIAGNOSIS — R600 Localized edema: Secondary | ICD-10-CM

## 2024-02-18 LAB — ECHOCARDIOGRAM COMPLETE
Area-P 1/2: 3.34 cm2
S' Lateral: 3.2 cm

## 2024-02-20 ENCOUNTER — Ambulatory Visit: Payer: Self-pay | Admitting: Emergency Medicine

## 2024-02-23 NOTE — Telephone Encounter (Signed)
 Please review and advise.

## 2024-02-24 ENCOUNTER — Ambulatory Visit: Payer: Medicare Other | Admitting: Internal Medicine

## 2024-02-24 ENCOUNTER — Telehealth: Payer: Self-pay

## 2024-02-24 ENCOUNTER — Encounter: Payer: Self-pay | Admitting: Oncology

## 2024-02-24 ENCOUNTER — Inpatient Hospital Stay (HOSPITAL_BASED_OUTPATIENT_CLINIC_OR_DEPARTMENT_OTHER): Admitting: Oncology

## 2024-02-24 ENCOUNTER — Other Ambulatory Visit: Payer: Medicare Other

## 2024-02-24 ENCOUNTER — Other Ambulatory Visit: Payer: Self-pay | Admitting: Physician Assistant

## 2024-02-24 ENCOUNTER — Inpatient Hospital Stay: Attending: Oncology

## 2024-02-24 VITALS — BP 136/75 | HR 73 | Temp 97.5°F | Resp 18 | Wt 212.0 lb

## 2024-02-24 DIAGNOSIS — C9110 Chronic lymphocytic leukemia of B-cell type not having achieved remission: Secondary | ICD-10-CM

## 2024-02-24 DIAGNOSIS — J984 Other disorders of lung: Secondary | ICD-10-CM | POA: Diagnosis not present

## 2024-02-24 DIAGNOSIS — M1990 Unspecified osteoarthritis, unspecified site: Secondary | ICD-10-CM | POA: Diagnosis not present

## 2024-02-24 DIAGNOSIS — Z87891 Personal history of nicotine dependence: Secondary | ICD-10-CM | POA: Insufficient documentation

## 2024-02-24 DIAGNOSIS — Z7982 Long term (current) use of aspirin: Secondary | ICD-10-CM | POA: Diagnosis not present

## 2024-02-24 DIAGNOSIS — Z79899 Other long term (current) drug therapy: Secondary | ICD-10-CM | POA: Insufficient documentation

## 2024-02-24 DIAGNOSIS — K219 Gastro-esophageal reflux disease without esophagitis: Secondary | ICD-10-CM

## 2024-02-24 DIAGNOSIS — R1013 Epigastric pain: Secondary | ICD-10-CM

## 2024-02-24 LAB — CMP (CANCER CENTER ONLY)
ALT: 19 U/L (ref 0–44)
AST: 20 U/L (ref 15–41)
Albumin: 4.2 g/dL (ref 3.5–5.0)
Alkaline Phosphatase: 59 U/L (ref 38–126)
Anion gap: 10 (ref 5–15)
BUN: 28 mg/dL — ABNORMAL HIGH (ref 8–23)
CO2: 26 mmol/L (ref 22–32)
Calcium: 9.1 mg/dL (ref 8.9–10.3)
Chloride: 104 mmol/L (ref 98–111)
Creatinine: 1.09 mg/dL — ABNORMAL HIGH (ref 0.44–1.00)
GFR, Estimated: 55 mL/min — ABNORMAL LOW (ref >60)
Glucose, Bld: 105 mg/dL — ABNORMAL HIGH (ref 70–99)
Potassium: 3.1 mmol/L — ABNORMAL LOW (ref 3.5–5.1)
Sodium: 140 mmol/L (ref 135–145)
Total Bilirubin: 0.4 mg/dL (ref 0.0–1.2)
Total Protein: 7.1 g/dL (ref 6.5–8.1)

## 2024-02-24 LAB — CBC WITH DIFFERENTIAL (CANCER CENTER ONLY)
Abs Immature Granulocytes: 0.03 10*3/uL (ref 0.00–0.07)
Basophils Absolute: 0.1 10*3/uL (ref 0.0–0.1)
Basophils Relative: 1 %
Eosinophils Absolute: 0.3 10*3/uL (ref 0.0–0.5)
Eosinophils Relative: 2 %
HCT: 39.9 % (ref 36.0–46.0)
Hemoglobin: 12.9 g/dL (ref 12.0–15.0)
Immature Granulocytes: 0 %
Lymphocytes Relative: 49 %
Lymphs Abs: 5.2 10*3/uL — ABNORMAL HIGH (ref 0.7–4.0)
MCH: 30.2 pg (ref 26.0–34.0)
MCHC: 32.3 g/dL (ref 30.0–36.0)
MCV: 93.4 fL (ref 80.0–100.0)
Monocytes Absolute: 0.6 10*3/uL (ref 0.1–1.0)
Monocytes Relative: 6 %
Neutro Abs: 4.5 10*3/uL (ref 1.7–7.7)
Neutrophils Relative %: 42 %
Platelet Count: 205 10*3/uL (ref 150–400)
RBC: 4.27 MIL/uL (ref 3.87–5.11)
RDW: 13 % (ref 11.5–15.5)
WBC Count: 10.6 10*3/uL — ABNORMAL HIGH (ref 4.0–10.5)
nRBC: 0 % (ref 0.0–0.2)

## 2024-02-24 LAB — LACTATE DEHYDROGENASE: LDH: 147 U/L (ref 98–192)

## 2024-02-24 MED ORDER — DEXLANSOPRAZOLE 30 MG PO CPDR
30.0000 mg | DELAYED_RELEASE_CAPSULE | Freq: Every day | ORAL | 3 refills | Status: AC
Start: 2024-02-24 — End: ?

## 2024-02-24 NOTE — Addendum Note (Signed)
 Addended byTrenton Frock on: 02/24/2024 12:59 PM   Modules accepted: Orders

## 2024-02-24 NOTE — Assessment & Plan Note (Signed)
-  Uses left knee brace.  Follows with orthopedic.

## 2024-02-24 NOTE — Assessment & Plan Note (Signed)
 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 Patient has no constitutional symptoms. Recommend watchful waiting.

## 2024-02-24 NOTE — Telephone Encounter (Signed)
 Copied from CRM [id number]. Topic: Clinical - Medical Advice >> Feb 24, 2024  2:54 PM Earnestine Goes B wrote: Reason for CRM: pt called to advised her provider, she took labs with her cancer dr. Was told her kidney levels dropped. Please call pt back at [phone]

## 2024-02-24 NOTE — Assessment & Plan Note (Signed)
-  Follows with Dr. Viva Grise.

## 2024-02-24 NOTE — Progress Notes (Signed)
 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 Patient has no constitutional symptoms. Recommend watchful waiting.  Arthritis -Uses left knee brace.  Follows with orthopedic.  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  Orders Placed This Encounter  Procedures   CBC with Differential (Cancer Center Only)    Standing Status:   Future    Expected Date:   08/25/2024    Expiration Date:   02/23/2025   CMP (Cancer Center only)    Standing Status:   Future    Expected Date:   08/25/2024    Expiration Date:   02/23/2025   Lactate dehydrogenase    Standing Status:   Future    Expected Date:   08/25/2024    Expiration Date:   02/23/2025   Follow-up in 6 months. All questions were answered. The patient knows to call the clinic with any problems, questions or concerns.  Timmy Forbes, MD, PhD [facility] Hematology Oncology 02/24/2024     HISTORY OF PRESENTING ILLNESS:  Natalie Eaton 68 y.o. female presents for follow-up of CLL Patient previously followed up with Dr.Agrawal. Patient switched care to me on  Extensive medical record review was performed by me on 02/24/2024  Oncology History  CLL (chronic lymphocytic leukemia) (HCC)  10/03/2022 Initial Diagnosis   CLL (chronic lymphocytic leukemia) (HCC)   10/03/2022 Cancer Staging   Staging form: Chronic Lymphocytic Leukemia / Small Lymphocytic Lymphoma, AJCC 8th Edition - Clinical stage from 10/03/2022: Modified Rai Stage 0 (Modified Rai risk: Low, Lymphocytosis: Present,  Adenopathy: Absent, Organomegaly: Absent, Anemia: Absent, Thrombocytopenia: Absent) - Signed by Agrawal, Kavita, MD on 10/03/2022 Stage prefix: Initial diagnosis   10/13/2023 Imaging   CT chest abdomen pelvis with contrast showed  No developing lymph node enlargement or splenomegaly.   Stable pneumatocele with some distortion and nodularity the right lung apex.Small hiatal hernia.  Colonic diverticula.Previous cholecystectomy with stable ectasia of the extrahepatic common duct extending to the ampulla with slightly abrupt transition but appearance is essentially unchanged from previous exam. Attention on follow-up.    Today patient reports feeling well.  She has noticed the left lower extremity swelling.  She has left knee arthritis, pending the decision of total knee replacement.  She wears brace while walking. She takes hydrochlorothiazide  and Lasix  as needed which helped slightly.  Patient reports had ultrasound done on left lower extremity, there was no blood clot.  Ultrasound report is not available to me currently in EMR.   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, per gyn   Hypertension    Hypothyroidism    Insomnia    Leukemia (HCC)    MVP (mitral valve prolapse)    Pneumonia    Pre-diabetes     SURGICAL HISTORY: Past Surgical History:  Procedure Laterality Date  ABDOMINAL HYSTERECTOMY     BLADDER SURGERY     CARDIAC CATHETERIZATION     GALLBLADDER SURGERY     LUNG REMOVAL, PARTIAL     ROTATOR CUFF REPAIR     TONSILLECTOMY     TUBAL LIGATION      SOCIAL HISTORY: Social History   Socioeconomic History   Marital status: Widowed    Spouse name: Not on file   Number of children: 2   Years of education: Not on file    Highest education level: Associate degree: occupational, Scientist, product/process development, or vocational program  Occupational History   Not on file  Tobacco Use   Smoking status: Former    Current packs/day: 0.00    Average packs/day: 0.3 packs/day for 15.0 years (3.8 ttl pk-yrs)    Types: Cigarettes    Start date: 64    Quit date: 24    Years since quitting: 29.4   Smokeless tobacco: Never  Vaping Use   Vaping status: Never Used  Substance and Sexual Activity   Alcohol use: Yes    Alcohol/week: 1.0 standard drink of alcohol    Types: 1 Glasses of wine per week   Drug use: No   Sexual activity: Not Currently    Birth control/protection: None  Other Topics Concern   Not on file  Social History Narrative   Not on file   Social Drivers of Health   Financial Resource Strain: Medium Risk (11/03/2023)   Overall Financial Resource Strain (CARDIA)    Difficulty of Paying Living Expenses: Somewhat hard  Food Insecurity: Food Insecurity Present (11/03/2023)   Hunger Vital Sign    Worried About Running Out of Food in the Last Year: Sometimes true    Ran Out of Food in the Last Year: Sometimes true  Transportation Needs: No Transportation Needs (11/03/2023)   PRAPARE - Administrator, Civil Service (Medical): No    Lack of Transportation (Non-Medical): No  Physical Activity: Inactive (11/03/2023)   Exercise Vital Sign    Days of Exercise per Week: 0 days    Minutes of Exercise per Session: 30 min  Stress: No Stress Concern Present (11/03/2023)   [employer] of Occupational Health - Occupational Stress Questionnaire    Feeling of Stress : Not at all  Social Connections: Unknown (11/03/2023)   Social Connection and Isolation Panel [NHANES]    Frequency of Communication with Friends and Family: Twice a week    Frequency of Social Gatherings with Friends and Family: Patient declined    Attends Religious Services: More than 4 times per year    Active Member of [employer] or Organizations: Yes     Attends Banker Meetings: More than 4 times per year    Marital Status: Widowed  Intimate Partner Violence: Not At Risk (02/25/2023)   Humiliation, Afraid, Rape, and Kick questionnaire    Fear of Current or Ex-Partner: No    Emotionally Abused: No    Physically Abused: No    Sexually Abused: No    FAMILY HISTORY: Family History  Problem Relation Age of Onset   Healthy Mother    Scoliosis Mother    Heart disease Father    Heart disease Brother    Healthy Daughter    Healthy Son     ALLERGIES:  is allergic to codeine.  MEDICATIONS:  Current Outpatient Medications  Medication Sig Dispense Refill   Albuterol -Budesonide (AIRSUPRA ) 90-80 MCG/ACT AERO Inhale 2 puffs into the lungs every 4 (four) hours as  needed.     aspirin 81 MG chewable tablet Chew 81 mg by mouth daily.     celecoxib  (CELEBREX ) 200 MG capsule Take 1 capsule (200 mg total) by mouth 2 (two) times daily. 60 capsule 2   Dexlansoprazole 30 MG capsule DR Take 1 capsule (30 mg total) by mouth daily. 30 capsule 3   furosemide  (LASIX ) 20 MG tablet TAKE 20 MG AS NEEDED FOR WEIGHT GAIN OF 3 POUNDS IN ONE NIGHT OR WEIGHT GAIN OF 5 POUNDS IN ONE WEEK. 90 tablet 1   gabapentin  (NEURONTIN ) 300 MG capsule TAKE 1 CAPSULE BY MOUTH THREE TIMES A DAY 270 capsule 0   hydrochlorothiazide  (HYDRODIURIL ) 25 MG tablet Take 1 tablet (25 mg total) by mouth daily. 30 tablet 11   levothyroxine  (SYNTHROID ) 75 MCG tablet TAKE 1 TABLET BY MOUTH EVERY DAY 90 tablet 1   methocarbamol  (ROBAXIN ) 750 MG tablet Take 1 tablet (750 mg total) by mouth 2 (two) times daily as needed for muscle spasms. 30 tablet 3   Multiple Vitamin (MULTIVITAMIN ADULT PO) Take 1 each by mouth daily at 2 PM.     potassium chloride  (MICRO-K ) 10 MEQ CR capsule TAKE 1 CAPSULE BY MOUTH 2 TIMES DAILY. 180 capsule 1   rosuvastatin  (CRESTOR ) 10 MG tablet Take 1 tablet (10 mg total) by mouth daily. 90 tablet 0   traMADol (ULTRAM) 50 MG tablet Take 50 mg by mouth 3  (three) times daily.     traZODone  (DESYREL ) 100 MG tablet Take 1 tablet (100 mg total) by mouth at bedtime. 30 tablet 1   valACYclovir  (VALTREX ) 500 MG tablet Take 1 tablet (500 mg total) by mouth daily. Or BID for 3 days prn sx 90 tablet 2   No current facility-administered medications for this visit.     Review of Systems  Constitutional:  Positive for fatigue. Negative for appetite change, chills and fever.  HENT:   Negative for hearing loss and voice change.   Eyes:  Negative for eye problems.  Respiratory:  Negative for chest tightness and cough.   Cardiovascular:  Positive for leg swelling. Negative for chest pain.  Gastrointestinal:  Negative for abdominal distention, abdominal pain, blood in stool, nausea and vomiting.  Endocrine: Negative for hot flashes.  Genitourinary:  Negative for difficulty urinating and frequency.   Musculoskeletal:  Negative for arthralgias.  Skin:  Negative for itching and rash.  Neurological:  Negative for extremity weakness.  Hematological:  Negative for adenopathy.  Psychiatric/Behavioral:  Negative for confusion.      PHYSICAL EXAMINATION: ECOG PERFORMANCE STATUS: 1 - Symptomatic but completely ambulatory  Vitals:   02/24/24 1412  BP: 136/75  Pulse: 73  Resp: 18  Temp: (!) 97.5 F (36.4 C)  SpO2: 99%    Filed Weights   02/24/24 1412  Weight: 212 lb (96.2 kg)     Physical Exam Constitutional:      General: She is not in acute distress.    Appearance: She is not diaphoretic.  HENT:     Head: Normocephalic and atraumatic.  Eyes:     General: No scleral icterus. Cardiovascular:     Rate and Rhythm: Normal rate and regular rhythm.     Heart sounds: No murmur heard. Pulmonary:     Effort: Pulmonary effort is normal. No respiratory distress.     Breath sounds: No wheezing.  Abdominal:     General: There is no distension.     Palpations: Abdomen is soft.     Tenderness: There  is no abdominal tenderness.  Musculoskeletal:         General: Normal range of motion.     Cervical back: Normal range of motion and neck supple.     Right lower leg: Edema present.  Skin:    General: Skin is warm and dry.     Findings: No erythema.  Neurological:     Mental Status: She is alert and oriented to person, place, and time. Mental status is at baseline.     Cranial Nerves: No cranial nerve deficit.     Motor: No abnormal muscle tone.  Psychiatric:        Mood and Affect: Mood and affect normal.      LABORATORY DATA:  I have reviewed the data as listed Lab Results  Component Value Date   WBC 10.6 (H) 02/24/2024   HGB 12.9 02/24/2024   HCT 39.9 02/24/2024   MCV 93.4 02/24/2024   PLT 205 02/24/2024   Recent Labs    07/04/23 0954 09/11/23 0836 10/27/23 1328 02/16/24 1100 02/24/24 1327  NA 137   < > 136 142 140  K 3.7   < > 3.6 4.7 3.1*  CL 102   < > 101 104 104  CO2 26   < > 24 31 26   GLUCOSE 103*   < > 101* 106* 105*  BUN 38*   < > 24* 16 28*  CREATININE 0.72   < > 0.73 0.77 1.09*  CALCIUM  9.4   < > 9.5 9.9 9.1  GFRNONAA >60  --  >60  --  55*  PROT 7.2   < > 7.0 6.4 7.1  ALBUMIN 4.3   < > 4.4 4.4 4.2  AST 15   < > 23 16 20   ALT 17   < > 27 19 19   ALKPHOS 62   < > 55 56 59  BILITOT 0.3   < > 0.5 0.4 0.4   < > = values in this interval not displayed.    RADIOGRAPHIC STUDIES: I have personally reviewed the radiological images as listed and agreed with the findings in the report. ECHOCARDIOGRAM COMPLETE Result Date: 02/18/2024    ECHOCARDIOGRAM REPORT   Patient Name:   VIA ROSADO Duncanson Date of Exam: 02/18/2024 Medical Rec #:  [id number]      Height:       66.0 in Accession #:    [id number]     Weight:       215.4 lb Date of Birth:  06-18-1956       BSA:          2.064 m Patient Age:    67 years       BP:           136/84 mmHg Patient Gender: F              HR:           76 bpm. Exam Location:  [address] Procedure: 2D Echo, Cardiac Doppler and Color Doppler (Both Spectral and Color            Flow Doppler were  utilized during procedure). Indications:    R60.0 Lower extremity edema.  History:        Patient has prior history of Echocardiogram examinations, most                 recent 07/21/2020. CAD, Signs/Symptoms:Murmur; Risk  Factors:Hypertension.  Sonographer:    Brigid Canada RDCS Referring Phys: [id number] MADISON L FOUNTAIN IMPRESSIONS  1. Left ventricular ejection fraction, by estimation, is 60 to 65%. The left ventricle has normal function. The left ventricle has no regional wall motion abnormalities. Left ventricular diastolic parameters are consistent with Grade I diastolic dysfunction (impaired relaxation).  2. Right ventricular systolic function is normal. The right ventricular size is normal. There is normal pulmonary artery systolic pressure.  3. The mitral valve is normal in structure. Trivial mitral valve regurgitation. No evidence of mitral stenosis.  4. The aortic valve is tricuspid. Aortic valve regurgitation is not visualized. No aortic stenosis is present.  5. The inferior vena cava is normal in size with greater than 50% respiratory variability, suggesting right atrial pressure of 3 mmHg. FINDINGS  Left Ventricle: Left ventricular ejection fraction, by estimation, is 60 to 65%. The left ventricle has normal function. The left ventricle has no regional wall motion abnormalities. The left ventricular internal cavity size was normal in size. There is  no left ventricular hypertrophy. Left ventricular diastolic parameters are consistent with Grade I diastolic dysfunction (impaired relaxation). Indeterminate filling pressures. Right Ventricle: The right ventricular size is normal. No increase in right ventricular wall thickness. Right ventricular systolic function is normal. There is normal pulmonary artery systolic pressure. The tricuspid regurgitant velocity is 1.80 m/s, and  with an assumed right atrial pressure of 3 mmHg, the estimated right ventricular systolic pressure is 16.0  mmHg. Left Atrium: Left atrial size was normal in size. Right Atrium: Right atrial size was normal in size. Pericardium: There is no evidence of pericardial effusion. Mitral Valve: The mitral valve is normal in structure. Trivial mitral valve regurgitation. No evidence of mitral valve stenosis. Tricuspid Valve: The tricuspid valve is normal in structure. Tricuspid valve regurgitation is trivial. No evidence of tricuspid stenosis. Aortic Valve: The aortic valve is tricuspid. Aortic valve regurgitation is not visualized. No aortic stenosis is present. Pulmonic Valve: The pulmonic valve was normal in structure. Pulmonic valve regurgitation is not visualized. No evidence of pulmonic stenosis. Aorta: The aortic root is normal in size and structure. Venous: The inferior vena cava is normal in size with greater than 50% respiratory variability, suggesting right atrial pressure of 3 mmHg. IAS/Shunts: No atrial level shunt detected by color flow Doppler.  LEFT VENTRICLE PLAX 2D LVIDd:         4.90 cm   Diastology LVIDs:         3.20 cm   LV e' medial:    5.77 cm/s LV PW:         1.00 cm   LV E/e' medial:  9.9 LV IVS:        1.00 cm   LV e' lateral:   4.79 cm/s LVOT diam:     2.20 cm   LV E/e' lateral: 11.9 LV SV:         80 LV SV Index:   39 LVOT Area:     3.80 cm  RIGHT VENTRICLE             IVC RV Basal diam:  3.00 cm     IVC diam: 0.60 cm RV S prime:     15.30 cm/s TAPSE (M-mode): 2.6 cm LEFT ATRIUM             Index        RIGHT ATRIUM          Index LA diam:  3.90 cm 1.89 cm/m   RA Area:     6.81 cm LA Vol (A2C):   43.5 ml 21.08 ml/m  RA Volume:   12.00 ml 5.81 ml/m LA Vol (A4C):   33.8 ml 16.38 ml/m LA Biplane Vol: 39.5 ml 19.14 ml/m  AORTIC VALVE LVOT Vmax:   101.45 cm/s LVOT Vmean:  69.450 cm/s LVOT VTI:    0.211 m  AORTA Ao Root diam: 3.60 cm Ao Asc diam:  3.30 cm MITRAL VALVE                TRICUSPID VALVE MV Area (PHT): 3.34 cm     TR Peak grad:   13.0 mmHg MV Decel Time: 227 msec     TR Vmax:         180.00 cm/s MV E velocity: 57.10 cm/s MV A velocity: 104.00 cm/s  SHUNTS MV E/A ratio:  0.55         Systemic VTI:  0.21 m                             Systemic Diam: 2.20 cm Maudine Sos MD Electronically signed by Maudine Sos MD Signature Date/Time: 02/18/2024/2:08:14 PM    Final

## 2024-02-25 ENCOUNTER — Encounter: Payer: Self-pay | Admitting: Physician Assistant

## 2024-02-25 NOTE — Telephone Encounter (Signed)
 Pt notified of instructions

## 2024-02-26 ENCOUNTER — Telehealth: Payer: Self-pay | Admitting: *Deleted

## 2024-02-26 ENCOUNTER — Encounter

## 2024-02-26 NOTE — Telephone Encounter (Signed)
 From what I can see the patient had an appointment on 08/1024 and it was in the afternoon and per the schedulers said patient did not want to have an afternoon appointment wanted to have a a.m. appointment.  Then they just changed her it still December 10 but it is on a a.m. time.  I tried to call her to see if this was what she is talking about but she did not answer so I left the message about all of this and if she wants to she can give me on a call back

## 2024-03-01 ENCOUNTER — Encounter: Payer: Self-pay | Admitting: Physician Assistant

## 2024-03-01 ENCOUNTER — Telehealth: Payer: Self-pay

## 2024-03-01 NOTE — Telephone Encounter (Signed)
   Pre-operative Risk Assessment    Patient Name: Natalie Eaton  DOB: 04/09/1956 MRN: [id number]   Date of last office visit: 01/13/24 MADISON FOUNTAIN, NP Date of next office visit: NONE   Request for Surgical Clearance    Procedure:  LEFT TOTAL KNEE ARTHROPLASTY  Date of Surgery:  Clearance 05/31/24                                Surgeon:  DR Liliane Rei Surgeon's Group or Practice Name:  [facility] Phone number:  [phone] Fax number:  [phone]    ATTN: KERRI MAZE   Type of Clearance Requested:   - Medical  - Pharmacy:  Hold Aspirin     Type of Anesthesia:  CHOICE   Additional requests/questions:    SignedCollin Deal   03/01/2024, 4:59 PM

## 2024-03-02 ENCOUNTER — Encounter: Payer: Self-pay | Admitting: Physician Assistant

## 2024-03-02 ENCOUNTER — Other Ambulatory Visit: Payer: Self-pay | Admitting: Physician Assistant

## 2024-03-02 ENCOUNTER — Ambulatory Visit: Admitting: Physician Assistant

## 2024-03-02 DIAGNOSIS — K219 Gastro-esophageal reflux disease without esophagitis: Secondary | ICD-10-CM

## 2024-03-02 MED ORDER — RABEPRAZOLE SODIUM 20 MG PO TBEC: 20.0000 mg | DELAYED_RELEASE_TABLET | Freq: Every day | ORAL | 1 refills | Status: DC

## 2024-03-02 NOTE — Telephone Encounter (Signed)
 According to the referral notes the referral was faxed to [facility] with this response:  Rejection Reason - Other - Referral printed. Will be held to schedule with Dr Anna/Dr Baldomero Bone when scheduling is available Lucyann Sacks said on Feb 13, 2024 10:40 AM

## 2024-03-02 NOTE — Telephone Encounter (Signed)
 Madison,   You saw this patient on 01/13/2024. Per office protocol, will you please comment on medical clearance for left total knee arthroplasty on 05/31/2024?  Please route your response to P [facility] Preop. I will communicate with requesting office once you have given recommendations.   Thank you!  Morey Ar, NP

## 2024-03-03 NOTE — Telephone Encounter (Signed)
 Pt scheduled w/ GI 04/05/24

## 2024-03-03 NOTE — Telephone Encounter (Signed)
   Name: Natalie Eaton  DOB: 1956/06/18  MRN: [id number]   Primary Cardiologist: Lauro Portal, MD  Chart reviewed as part of pre-operative protocol coverage. Dakiyah Heinke Errington was last seen on 01/13/2024 by Palmer Bobo, NP.  Per [facility] She was able to complete greater than 4 METS during office visit.  She was without anginal symptoms.  She is able to proceed with procedure without further cardiovascular testing.  Therefore, based on ACC/AHA guidelines, the patient would be an acceptable risk for the planned procedure without further cardiovascular testing.   Ideally aspirin should be continued without interruption, however if the bleeding risk is too great, aspirin may be held for 5-7 days prior to surgery. Please resume aspirin post operatively when it is felt to be safe from a bleeding standpoint.    I will route this recommendation to the requesting party via Epic fax function and remove from pre-op pool. Please call with questions.  Morey Ar, NP 03/03/2024, 11:58 AM

## 2024-03-10 ENCOUNTER — Encounter: Payer: Self-pay | Admitting: Nurse Practitioner

## 2024-03-17 ENCOUNTER — Ambulatory Visit: Admitting: Nurse Practitioner

## 2024-03-17 ENCOUNTER — Encounter: Payer: Self-pay | Admitting: Physician Assistant

## 2024-03-23 ENCOUNTER — Ambulatory Visit

## 2024-03-23 ENCOUNTER — Encounter: Payer: Self-pay | Admitting: Physician Assistant

## 2024-03-23 ENCOUNTER — Other Ambulatory Visit: Payer: Self-pay | Admitting: Obstetrics and Gynecology

## 2024-03-23 VITALS — Ht 66.0 in | Wt 212.0 lb

## 2024-03-23 DIAGNOSIS — Z0000 Encounter for general adult medical examination without abnormal findings: Secondary | ICD-10-CM

## 2024-03-23 DIAGNOSIS — Z1231 Encounter for screening mammogram for malignant neoplasm of breast: Secondary | ICD-10-CM

## 2024-03-23 DIAGNOSIS — Z5919 Other inadequate housing: Secondary | ICD-10-CM

## 2024-03-23 NOTE — Progress Notes (Signed)
 Subjective:   Natalie Eaton is a 68 y.o. who presents for a Medicare Wellness preventive visit.  As a reminder, Annual Wellness Visits don't include a physical exam, and some assessments may be limited, especially if this visit is performed virtually. We may recommend an in-person follow-up visit with your provider if needed.  Visit Complete: Virtual I connected with  Zarinah Oviatt Pulsifer on 03/23/24 by a audio enabled telemedicine application and verified that I am speaking with the correct person using two identifiers.  Patient Location: Home  Provider Location: Home Office  I discussed the limitations of evaluation and management by telemedicine. The patient expressed understanding and agreed to proceed.  Vital Signs: Because this visit was a virtual/telehealth visit, some criteria may be missing or patient reported. Any vitals not documented were not able to be obtained and vitals that have been documented are patient reported.  VideoDeclined- This patient declined Librarian, academic. Therefore the visit was completed with audio only.  Persons Participating in Visit: Patient.  AWV Questionnaire: No: Patient Medicare AWV questionnaire was not completed prior to this visit.  Cardiac Risk Factors include: advanced age (>71men, >26 women);hypertension     Objective:    Today's Vitals   03/23/24 0926  Weight: 212 lb (96.2 kg)  Height: 5' 6 (1.676 m)   Body mass index is 34.22 kg/m.     03/23/2024    9:55 AM 02/24/2024    2:03 PM 02/24/2024    1:59 PM 10/27/2023    1:13 PM 10/06/2023    2:35 PM 10/06/2023    2:23 PM 07/04/2023   10:08 AM  Advanced Directives  Does Patient Have a Medical Advance Directive? No No No No No No No  Would patient like information on creating a medical advance directive? Yes (MAU/Ambulatory/Procedural Areas - Information given)          Current Medications (verified) Outpatient Encounter Medications as of 03/23/2024   Medication Sig   Albuterol -Budesonide (AIRSUPRA ) 90-80 MCG/ACT AERO Inhale 2 puffs into the lungs every 4 (four) hours as needed.   aspirin 81 MG chewable tablet Chew 81 mg by mouth daily.   celecoxib  (CELEBREX ) 200 MG capsule Take 1 capsule (200 mg total) by mouth 2 (two) times daily.   furosemide  (LASIX ) 20 MG tablet TAKE 20 MG AS NEEDED FOR WEIGHT GAIN OF 3 POUNDS IN ONE NIGHT OR WEIGHT GAIN OF 5 POUNDS IN ONE WEEK.   gabapentin  (NEURONTIN ) 300 MG capsule TAKE 1 CAPSULE BY MOUTH THREE TIMES A DAY   hydrochlorothiazide  (HYDRODIURIL ) 25 MG tablet Take 1 tablet (25 mg total) by mouth daily.   levothyroxine  (SYNTHROID ) 75 MCG tablet TAKE 1 TABLET BY MOUTH EVERY DAY   methocarbamol  (ROBAXIN ) 750 MG tablet Take 1 tablet (750 mg total) by mouth 2 (two) times daily as needed for muscle spasms.   Multiple Vitamin (MULTIVITAMIN ADULT PO) Take 1 each by mouth daily at 2 PM.   potassium chloride  (MICRO-K ) 10 MEQ CR capsule TAKE 1 CAPSULE BY MOUTH 2 TIMES DAILY.   RABEprazole  (ACIPHEX ) 20 MG tablet Take 1 tablet (20 mg total) by mouth daily.   rosuvastatin  (CRESTOR ) 10 MG tablet Take 1 tablet (10 mg total) by mouth daily.   traMADol (ULTRAM) 50 MG tablet Take 50 mg by mouth 3 (three) times daily.   traZODone  (DESYREL ) 100 MG tablet Take 1 tablet (100 mg total) by mouth at bedtime.   valACYclovir  (VALTREX ) 500 MG tablet Take 1 tablet (500 mg total)  by mouth daily. Or BID for 3 days prn sx   No facility-administered encounter medications on file as of 03/23/2024.    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, per gyn   Hypertension    Hypothyroidism    Insomnia    Leukemia (HCC)    MVP (mitral valve prolapse)    Pneumonia     Pre-diabetes    Past Surgical History:  Procedure Laterality Date   ABDOMINAL HYSTERECTOMY     BLADDER SURGERY     CARDIAC CATHETERIZATION     GALLBLADDER SURGERY     LUNG REMOVAL, PARTIAL     ROTATOR CUFF REPAIR     TONSILLECTOMY     TUBAL LIGATION     Family History  Problem Relation Age of Onset   Scoliosis Mother    Heart disease Father        Died   Heart disease Brother    Healthy Daughter    Healthy Son    Social History   Socioeconomic History   Marital status: Widowed    Spouse name: Not on file   Number of children: 2   Years of education: Not on file   Highest education level: Associate degree: occupational, Scientist, product/process development, or vocational program  Occupational History   Not on file  Tobacco Use   Smoking status: Former    Current packs/day: 0.00    Average packs/day: 0.3 packs/day for 15.0 years (3.8 ttl pk-yrs)    Types: Cigarettes    Start date: 84    Quit date: 60    Years since quitting: 29.5   Smokeless tobacco: Never  Vaping Use   Vaping status: Never Used  Substance and Sexual Activity   Alcohol use: Yes    Alcohol/week: 1.0 standard drink of alcohol    Types: 1 Glasses of wine per week   Drug use: No   Sexual activity: Not Currently    Birth control/protection: None  Other Topics Concern   Not on file  Social History Narrative   Not on file   Social Drivers of Health   Financial Resource Strain: Medium Risk (11/03/2023)   Overall Financial Resource Strain (CARDIA)    Difficulty of Paying Living Expenses: Somewhat hard  Food Insecurity: Food Insecurity Present (11/03/2023)   Hunger Vital Sign    Worried About Running Out of Food in the Last Year: Sometimes true    Ran Out of Food in the Last Year: Sometimes true  Transportation Needs: No Transportation Needs (11/03/2023)   PRAPARE - Administrator, Civil Service (Medical): No    Lack of Transportation (Non-Medical): No  Physical Activity: Inactive (11/03/2023)   Exercise  Vital Sign    Days of Exercise per Week: 0 days    Minutes of Exercise per Session: 30 min  Stress: No Stress Concern Present (11/03/2023)   [employer] of Occupational Health - Occupational Stress Questionnaire    Feeling of Stress : Not at all  Social Connections: Unknown (11/03/2023)   Social Connection and Isolation Panel    Frequency of Communication with Friends and Family: Twice a week    Frequency of Social  Gatherings with Friends and Family: Patient declined    Attends Religious Services: More than 4 times per year    Active Member of Clubs or Organizations: Yes    Attends Banker Meetings: More than 4 times per year    Marital Status: Widowed    Tobacco Counseling Counseling given: Not Answered    Clinical Intake:  Pre-visit preparation completed: Yes  Pain : No/denies pain     Diabetes: No  Lab Results  Component Value Date   HGBA1C 6.4 02/16/2024   HGBA1C 6.1 (H) 09/11/2023   HGBA1C 5.9 (H) 07/04/2022     How often do you need to have someone help you when you read instructions, pamphlets, or other written materials from your doctor or pharmacy?: 1 - Never  Interpreter Needed?: No  Information entered by :: Charmaine Bloodgood LPN   Activities of Daily Living     03/23/2024    9:54 AM  In your present state of health, do you have any difficulty performing the following activities:  Hearing? 0  Vision? 0  Difficulty concentrating or making decisions? 0  Walking or climbing stairs? 1  Dressing or bathing? 0  Doing errands, shopping? 0  Preparing Food and eating ? N  Using the Toilet? N  In the past six months, have you accidently leaked urine? N  Do you have problems with loss of bowel control? N  Managing your Medications? N  Managing your Finances? N  Housekeeping or managing your Housekeeping? N    Patient Care Team: Cyndi Shaver, PA-C as PCP - General (Physician Assistant) Court Dorn PARAS, MD as PCP - Cardiology  (Cardiology) Babara Call, MD as Consulting Physician (Oncology) Melodi Lerner, MD as Consulting Physician (Orthopedic Surgery) Bonner Ade, MD as Consulting Physician (Physical Medicine and Rehabilitation) Colon Shove, MD as Consulting Physician (Neurosurgery)  I have updated your Care Teams any recent Medical Services you may have received from other providers in the past year.     Assessment:   This is a routine wellness examination for Lamonda.  Hearing/Vision screen Hearing Screening - Comments:: Denies hearing difficulties   Vision Screening - Comments:: No vision problems; will schedule routine eye exam soon     Goals Addressed             This Visit's Progress    Improve health and maintain independence   On track      Depression Screen     01/19/2024    3:52 PM 02/25/2023   11:17 AM 10/15/2022    9:34 AM 09/20/2022    1:56 PM 07/04/2022    9:22 AM 11/09/2020   11:47 AM 09/25/2020    3:56 PM  PHQ 2/9 Scores  PHQ - 2 Score 0 0 0 0 0 6 0  PHQ- 9 Score 3  7 6  15      Fall Risk     03/23/2024    9:34 AM 02/25/2023   11:09 AM 10/15/2022    9:34 AM 09/20/2022    1:56 PM 07/04/2022    9:21 AM  Fall Risk   Falls in the past year? 0 0 0 1 1  Number falls in past yr: 0 0 0 0 1  Injury with Fall? 0 0 1 1 1   Risk for fall due to : Impaired mobility;Impaired balance/gait No Fall Risks History of fall(s) History of fall(s) History of fall(s)  Follow up Falls prevention discussed;Education provided;Falls evaluation completed Education provided;Falls prevention discussed Falls evaluation completed  Falls evaluation completed;Education provided;Falls prevention discussed  Falls evaluation completed      Data saved with a previous flowsheet row definition    MEDICARE RISK AT HOME:  Medicare Risk at Home Any stairs in or around the home?: Yes If so, are there any without handrails?: No Home free of loose throw rugs in walkways, pet beds, electrical cords, etc?: Yes Adequate  lighting in your home to reduce risk of falls?: Yes Life alert?: No Use of a cane, walker or w/c?: Yes Grab bars in the bathroom?: Yes Shower chair or bench in shower?: No Elevated toilet seat or a handicapped toilet?: No  TIMED UP AND GO:  Was the test performed?  No  Cognitive Function: Declined/Normal: No cognitive concerns noted by patient or family. Patient alert, oriented, able to answer questions appropriately and recall recent events. No signs of memory loss or confusion.        02/25/2023   11:24 AM  6CIT Screen  What Year? 0 points  What month? 0 points  What time? 0 points  Count back from 20 0 points  Months in reverse 0 points  Repeat phrase 0 points  Total Score 0 points    Immunizations Immunization History  Administered Date(s) Administered   Influenza,inj,Quad PF,6+ Mos 08/21/2018, 06/10/2019   PFIZER(Purple Top)SARS-COV-2 Vaccination 12/09/2019, 01/01/2020, 07/04/2020   Pneumococcal Conjugate-13 06/02/2020   Pneumococcal-Unspecified 09/17/2019   Tdap 09/16/2018, 08/11/2020, 02/02/2022   Unspecified SARS-COV-2 Vaccination 12/14/2020    Screening Tests Health Maintenance  Topic Date Due   Zoster Vaccines- Shingrix (1 of 2) Never done   Pneumococcal Vaccine: 50+ Years (2 of 2 - PPSV23, PCV20, or PCV21) 07/28/2020   COVID-19 Vaccine (5 - 2024-25 season) 05/18/2023   MAMMOGRAM  06/14/2023   INFLUENZA VACCINE  04/16/2024   Medicare Annual Wellness (AWV)  03/23/2025   Fecal DNA (Cologuard)  09/22/2025   DEXA SCAN  01/11/2026   DTaP/Tdap/Td (4 - Td or Tdap) 02/03/2032   Hepatitis C Screening  Completed   Hepatitis B Vaccines  Aged Out   HPV VACCINES  Aged Out   Meningococcal B Vaccine  Aged Out    Health Maintenance  Health Maintenance Due  Topic Date Due   Zoster Vaccines- Shingrix (1 of 2) Never done   Pneumococcal Vaccine: 50+ Years (2 of 2 - PPSV23, PCV20, or PCV21) 07/28/2020   COVID-19 Vaccine (5 - 2024-25 season) 05/18/2023   MAMMOGRAM   06/14/2023   Health Maintenance Items Addressed: Mammogram ordered  Additional Screening:  Vision Screening: Recommended annual ophthalmology exams for early detection of glaucoma and other disorders of the eye. Would you like a referral to an eye doctor? No    Dental Screening: Recommended annual dental exams for proper oral hygiene  Community Resource Referral / Chronic Care Management: CRR required this visit?  Yes   CCM required this visit?  No   Plan:    I have personally reviewed and noted the following in the patient's chart:   Medical and social history Use of alcohol, tobacco or illicit drugs  Current medications and supplements including opioid prescriptions. Patient is not currently taking opioid prescriptions. Functional ability and status Nutritional status Physical activity Advanced directives List of other physicians Hospitalizations, surgeries, and ER visits in previous 12 months Vitals Screenings to include cognitive, depression, and falls Referrals and appointments  In addition, I have reviewed and discussed with patient certain preventive protocols, quality metrics, and best practice recommendations. A written personalized care plan for preventive  services as well as general preventive health recommendations were provided to patient.   Lavelle Pfeiffer [place], [place]   10/17/7972   After Visit Summary: (MyChart) Due to this being a telephonic visit, the after visit summary with patients personalized plan was offered to patient via MyChart   Notes: Nothing significant to report at this time.

## 2024-03-23 NOTE — Patient Instructions (Signed)
 Ms. Dannenberg , Thank you for taking time out of your busy schedule to complete your Annual Wellness Visit with me. I enjoyed our conversation and look forward to speaking with you again next year. I, as well as your care team,  appreciate your ongoing commitment to your health goals. Please review the following plan we discussed and let me know if I can assist you in the future. Your Game plan/ To Do List    Referrals:  You have an order for:  []   2D Mammogram  [x]   3D Mammogram  []   Bone Density      Please call for appointment:   [facility]  [address] [place] [address] [phone]     Make sure to wear two-piece clothing.  No lotions, powders, or deodorants the day of the appointment. Make sure to bring picture ID and insurance card.  Bring list of medications you are currently taking including any supplements.   Follow up Visits: Next Medicare AWV with our clinical staff: In 1 year    Have you seen your provider in the last 6 months (3 months if uncontrolled diabetes)? Yes Next Office Visit with your provider: To be scheduled   Clinician Recommendations:  Aim for 30 minutes of exercise or brisk walking, 6-8 glasses of water, and 5 servings of fruits and vegetables each day.       This is a list of the screening recommended for you and due dates:  Health Maintenance  Topic Date Due   Zoster (Shingles) Vaccine (1 of 2) Never done   Pneumococcal Vaccine for age over 66 (2 of 2 - PPSV23, PCV20, or PCV21) 07/28/2020   COVID-19 Vaccine (5 - 2024-25 season) 05/18/2023   Mammogram  06/14/2023   Flu Shot  04/16/2024   Medicare Annual Wellness Visit  03/23/2025   Cologuard (Stool DNA test)  09/22/2025   DEXA scan (bone density measurement)  01/11/2026   DTaP/Tdap/Td vaccine (4 - Td or Tdap) 02/03/2032   Hepatitis C Screening  Completed   Hepatitis B Vaccine  Aged Out   HPV Vaccine  Aged Out   Meningitis B Vaccine   Aged Out    Advanced directives: (ACP Link)Information on Advanced Care Planning can be found at [employer] Advance Health Care Directives Advance Health Care Directives. [email or web address]   Advance Care Planning is important because it:  [x]  Makes sure you receive the medical care that is consistent with your values, goals, and preferences  [x]  It provides guidance to your family and loved ones and reduces their decisional burden about whether or not they are making the right decisions based on your wishes.  Follow the link provided in your after visit summary or read over the paperwork we have mailed to you to help you started getting your Advance Directives in place. If you need assistance in completing these, please reach out to us  so that we can help you!  See attachments for Preventive Care and Fall Prevention Tips.

## 2024-03-25 NOTE — Progress Notes (Signed)
 Noted

## 2024-03-27 ENCOUNTER — Encounter: Payer: Self-pay | Admitting: Physician Assistant

## 2024-03-27 ENCOUNTER — Other Ambulatory Visit: Payer: Self-pay | Admitting: Family Medicine

## 2024-03-27 DIAGNOSIS — E7800 Pure hypercholesterolemia, unspecified: Secondary | ICD-10-CM

## 2024-03-29 ENCOUNTER — Other Ambulatory Visit: Payer: Self-pay | Admitting: Physician Assistant

## 2024-03-29 ENCOUNTER — Other Ambulatory Visit: Payer: Self-pay | Admitting: *Deleted

## 2024-03-29 DIAGNOSIS — E7800 Pure hypercholesterolemia, unspecified: Secondary | ICD-10-CM

## 2024-03-29 MED ORDER — ROSUVASTATIN CALCIUM 10 MG PO TABS: 10.0000 mg | ORAL_TABLET | Freq: Every day | ORAL | 3 refills | Status: DC

## 2024-03-29 MED ORDER — ROSUVASTATIN CALCIUM 10 MG PO TABS
10.0000 mg | ORAL_TABLET | Freq: Every day | ORAL | 3 refills | Status: AC
Start: 2024-03-29 — End: ?

## 2024-04-01 ENCOUNTER — Encounter: Payer: Self-pay | Admitting: Physician Assistant

## 2024-04-02 ENCOUNTER — Encounter: Payer: Self-pay | Admitting: Physician Assistant

## 2024-04-02 NOTE — Telephone Encounter (Signed)
 I spoke to patient and got the number, and Dr name of who is doing the surgery I will call over and see if I can get this done.

## 2024-04-09 ENCOUNTER — Ambulatory Visit
Admission: RE | Admit: 2024-04-09 | Discharge: 2024-04-09 | Disposition: A | Discharge status: Home / Self Care | Source: Ambulatory Visit | Attending: Physician Assistant | Admitting: Physician Assistant

## 2024-04-09 DIAGNOSIS — Z1231 Encounter for screening mammogram for malignant neoplasm of breast: Secondary | ICD-10-CM | POA: Insufficient documentation

## 2024-04-10 ENCOUNTER — Encounter: Payer: Self-pay | Admitting: Physician Assistant

## 2024-04-12 ENCOUNTER — Other Ambulatory Visit: Payer: Self-pay | Admitting: *Deleted

## 2024-04-12 ENCOUNTER — Telehealth: Payer: Self-pay | Admitting: *Deleted

## 2024-04-12 DIAGNOSIS — M1990 Unspecified osteoarthritis, unspecified site: Secondary | ICD-10-CM

## 2024-04-12 MED ORDER — CELECOXIB 200 MG PO CAPS: 200.0000 mg | ORAL_CAPSULE | Freq: Two times a day (BID) | ORAL | 2 refills | Status: DC

## 2024-04-12 NOTE — Progress Notes (Signed)
 Complex Care Management Note Care Guide Note  04/12/2024 Name: Natalie Eaton MRN: [id number] DOB: [birth year]  Natalie Eaton is a 68 y.o. year old female who is a primary care patient of Cyndi Shaver, [place] . The community resource team was consulted for assistance with Food Insecurity and Home Modifications  SDOH screenings and interventions completed:  Yes     SDOH Interventions Today    Flowsheet Row Most Recent Value  SDOH Interventions   Food Insecurity Interventions Intervention Not Indicated  [Patient sister staying with her will fix food]  Financial Strain Interventions Intervention Not Indicated, Community Resources Provided  [patient landlord installed a ramp for her so she is ok now]     Care guide performed the following interventions: Patient provided with information about care guide support team and interviewed to confirm resource needs.  Follow Up Plan:  No further follow up planned at this time. The patient has been provided with needed resources.  Encounter Outcome:  Patient Visit Completed Natalie Eaton  [facility] HealthPopulation Health Care Guide  Direct [phone] Fax:[phone] Website: [email or web address]

## 2024-04-13 ENCOUNTER — Telehealth: Payer: Self-pay | Admitting: Primary Care

## 2024-04-13 ENCOUNTER — Other Ambulatory Visit: Payer: Self-pay | Admitting: *Deleted

## 2024-04-13 ENCOUNTER — Inpatient Hospital Stay
Admission: RE | Admit: 2024-04-13 | Discharge: 2024-04-13 | Disposition: A | Discharge status: Home / Self Care | Payer: Self-pay | Source: Ambulatory Visit | Attending: Physician Assistant | Admitting: Physician Assistant

## 2024-04-13 ENCOUNTER — Ambulatory Visit: Payer: Self-pay | Admitting: Physician Assistant

## 2024-04-13 DIAGNOSIS — Z1231 Encounter for screening mammogram for malignant neoplasm of breast: Secondary | ICD-10-CM

## 2024-04-13 NOTE — Telephone Encounter (Signed)
 Fax received from Dr. Dempsey Lawn with [facility] to perform a left total knee arthroplasty on patient.  Patient needs surgery clearance. Surgery is 05/31/24. Patient was seen on 09/04/23. Office protocol is a risk assessment can be sent to surgeon if patient has been seen in 60 days or less.    Pt needs appt for risk assessment. I called her to get her scheduled and there was no answer- LMTCB

## 2024-04-26 ENCOUNTER — Other Ambulatory Visit: Payer: Self-pay | Admitting: Family Medicine

## 2024-04-26 DIAGNOSIS — G8929 Other chronic pain: Secondary | ICD-10-CM

## 2024-04-29 NOTE — Telephone Encounter (Signed)
 Pt is scheduled to see LG on 05/14/24

## 2024-05-06 ENCOUNTER — Ambulatory Visit: Admitting: Nurse Practitioner

## 2024-05-14 ENCOUNTER — Encounter: Payer: Self-pay | Admitting: Pulmonary Disease

## 2024-05-14 ENCOUNTER — Ambulatory Visit (INDEPENDENT_AMBULATORY_CARE_PROVIDER_SITE_OTHER): Admitting: Pulmonary Disease

## 2024-05-14 VITALS — BP 136/86 | HR 78 | Temp 98.3°F | Ht 66.0 in | Wt 199.0 lb

## 2024-05-14 DIAGNOSIS — J984 Other disorders of lung: Secondary | ICD-10-CM | POA: Diagnosis not present

## 2024-05-14 DIAGNOSIS — C9110 Chronic lymphocytic leukemia of B-cell type not having achieved remission: Secondary | ICD-10-CM | POA: Diagnosis not present

## 2024-05-14 DIAGNOSIS — J4520 Mild intermittent asthma, uncomplicated: Secondary | ICD-10-CM

## 2024-05-14 DIAGNOSIS — Z01811 Encounter for preprocedural respiratory examination: Secondary | ICD-10-CM | POA: Diagnosis not present

## 2024-05-14 LAB — NITRIC OXIDE: Nitric Oxide: 15

## 2024-05-14 NOTE — Patient Instructions (Signed)
 VISIT SUMMARY:  You came in today for a pre-operative clearance for your upcoming knee replacement surgery scheduled for May 31, 2024. We reviewed your medical history, including your asthma and pulmonary pneumatocele, and discussed your current health status.  YOUR PLAN:  -PREOPERATIVE PULMONARY EVALUATION FOR KNEE REPLACEMENT: This evaluation is to ensure your lungs are healthy enough for the upcoming knee replacement surgery. Your lung function is good, and your asthma is well controlled, indicating a low risk for the procedure. We will check your airway inflammation level and ensure you perform breathing exercises after the surgery as instructed by therapy.  -MILD INTERMITTENT ASTHMA: Mild intermittent asthma means you occasionally have asthma symptoms, but they are not frequent or severe. Your asthma is well controlled, and you have not needed to use your inhaler recently. Continue to use your inhaler as needed.  -PULMONARY PNEUMATOCELE: A pulmonary pneumatocele is a cyst in the lung that can occur after an infection. There were no acute issues reported with your pulmonary pneumatocele during this visit.  INSTRUCTIONS:  Please follow up with your breathing exercises post-surgery as instructed by your therapy team. Ensure you have your inhaler on hand and use it as needed. Your surgery is scheduled for May 31, 2024, at [facility] with Dr. Melodi at [facility].

## 2024-05-14 NOTE — Telephone Encounter (Signed)
 Copy of the 05/14/24 ov note with risk assessment was faxed to [facility].

## 2024-05-14 NOTE — Progress Notes (Signed)
 Subjective:    Patient ID: Natalie Eaton, female    DOB: 1956-06-09, 68 y.o.   MRN: [id number]  Patient Care Team: Cyndi Shaver, PA-C as PCP - General (Physician Assistant) Court Dorn PARAS, MD as PCP - Cardiology (Cardiology) Babara Call, MD as Consulting Physician (Oncology) Melodi Lerner, MD as Consulting Physician (Orthopedic Surgery) Bonner Ade, MD as Consulting Physician (Physical Medicine and Rehabilitation) Colon Shove, MD as Consulting Physician (Neurosurgery)  Chief Complaint  Patient presents with   Asthma    No breathing problems. Is scheduled for left knee surgery on 05/31/2024.    BACKGROUND/INTERVAL:68 year old female, former smoker. PMH hypertension, pneumatocele of lung, hepatomegaly, hepatic steatosis, hypothyroidism, CLL.  She follows here for mild intermittent asthma and her pulmonary pneumatocele.  HPI Discussed the use of AI scribe software for clinical note transcription with the patient, who gave verbal consent to proceed.  History of Present Illness   Natalie Eaton is a 68 year old female, remote former smoker, who presents for pre-operative clearance for knee replacement surgery.  She is preparing for left knee replacement surgery scheduled for May 31, 2024, at [facility], to be performed by Dr. Melodi from [facility]. She is currently visiting her doctors to obtain necessary pre-operative clearances.  She has a history of mild intermittent asthma but has not used her inhaler recently. She keeps it on hand for potential issues.  Issues usually occur during seasonal change.  She also has a history of pulmonary pneumatocele.  This has been stable.  She reports a past ear infection that led to her ear feeling closed. She uses Flonase  intranasally as needed.  She has not had recurrent issues with this.  She wears dentures, specifically the upper set, as the lower implants were removed due to osteoporosis and loosening. She manages  chewing with her jaw as the lower dentures do not stay in place.     No recent respiratory infections.  No fevers, chills.  No shortness of breath.  She does not endorse any other symptomatology today.  DATA 11/22/2020 alpha-1 antitrypsin: Phenotype MM, level 125 mg/dL (normal). 01/20/2023 CT chest : Right upper lobe scarring/architectural distortion with central thin-walled cavitary lesion extending to the hilum, measuring 4.7 x 3.5 cm, grossly unchanged. No further follow up needed.  02/07/2023 PFTs: FEV1 2.09 L or 78% predicted, FVC 2.71 L or 77% predicted, FEV1/FVC 77%, there is bronchodilator response with regards to airway resistance.  Lung volumes normal ERV 1% consistent with obesity.  Diffusion capacity normal.  Consistent with a minimal/mild obstructive airways disease with reversible component (asthmatic type).  Review of Systems A 10 point review of systems was performed and it is as noted above otherwise negative.   Patient Active Problem List   Diagnosis Date Noted   Decreased pulses in feet 11/04/2023   Screening mammogram for breast cancer 09/08/2023   Post-menopausal 09/08/2023   Obstructive airway disease (HCC) 02/17/2023   Upper airway cough syndrome 02/17/2023   Herpes simplex vulvovaginitis 12/10/2022   Hepatic steatosis 10/22/2022   Left upper quadrant abdominal pain 10/15/2022   Hepatomegaly 10/15/2022   Pneumatocele of lung 10/15/2022   Ventral hernia without obstruction or gangrene 10/15/2022   Flank pain 10/07/2022   CLL (chronic lymphocytic leukemia) (HCC) 10/03/2022   Epigastric pain 09/20/2022   Chronic back pain 09/20/2022   Prediabetes 07/11/2022   Arthritis 06/10/2019   MVP (mitral valve prolapse) 06/10/2019   Statin intolerance 02/17/2019   Pure hypercholesterolemia 11/16/2018   HSV (herpes  simplex virus) infection 03/23/2018   Body mass index (BMI) 30.0-30.9, adult 02/24/2018   Essential hypertension, benign 11/21/2017   Hypothyroidism 11/21/2017     Social History   Tobacco Use   Smoking status: Former    Current packs/day: 0.00    Average packs/day: 0.3 packs/day for 15.0 years (3.8 ttl pk-yrs)    Types: Cigarettes    Start date: 68    Quit date: 1996    Years since quitting: 29.6   Smokeless tobacco: Never  Substance Use Topics   Alcohol use: Yes    Alcohol/week: 1.0 standard drink of alcohol    Types: 1 Glasses of wine per week    Allergies  Allergen Reactions   Codeine Other (See Comments)    Tingling on her scalp.    Current Meds  Medication Sig   Albuterol -Budesonide (AIRSUPRA ) 90-80 MCG/ACT AERO Inhale 2 puffs into the lungs every 4 (four) hours as needed.   aspirin 81 MG chewable tablet Chew 81 mg by mouth daily.   celecoxib  (CELEBREX ) 200 MG capsule Take 1 capsule (200 mg total) by mouth 2 (two) times daily. (Patient taking differently: Take 200 mg by mouth 2 (two) times daily. Takes at bedtime)   gabapentin  (NEURONTIN ) 300 MG capsule TAKE 1 CAPSULE BY MOUTH THREE TIMES A DAY   hydrochlorothiazide  (HYDRODIURIL ) 25 MG tablet Take 1 tablet (25 mg total) by mouth daily.   levothyroxine  (SYNTHROID ) 75 MCG tablet TAKE 1 TABLET BY MOUTH EVERY DAY   methocarbamol  (ROBAXIN ) 750 MG tablet Take 1 tablet (750 mg total) by mouth 2 (two) times daily as needed for muscle spasms.   Multiple Vitamin (MULTIVITAMIN ADULT PO) Take 1 each by mouth daily at 2 PM.   potassium chloride  (MICRO-K ) 10 MEQ CR capsule TAKE 1 CAPSULE BY MOUTH 2 TIMES DAILY.   rosuvastatin  (CRESTOR ) 10 MG tablet Take 1 tablet (10 mg total) by mouth daily.   traMADol (ULTRAM) 50 MG tablet Take 50 mg by mouth 3 (three) times daily.   traZODone  (DESYREL ) 100 MG tablet Take 1 tablet (100 mg total) by mouth at bedtime. (Patient taking differently: Take 100 mg by mouth at bedtime. Takes as needed)   valACYclovir  (VALTREX ) 500 MG tablet Take 1 tablet (500 mg total) by mouth daily. Or BID for 3 days prn sx    Immunization History  Administered Date(s)  Administered   Influenza,inj,Quad PF,6+ Mos 08/21/2018, 06/10/2019   PFIZER(Purple Top)SARS-COV-2 Vaccination 12/09/2019, 01/01/2020, 07/04/2020   Pneumococcal Conjugate-13 06/02/2020   Pneumococcal-Unspecified 09/17/2019   Tdap 09/16/2018, 08/11/2020, 02/02/2022   Unspecified SARS-COV-2 Vaccination 12/14/2020        Objective:     BP 136/86   Pulse 78   Temp 98.3 F (36.8 C) (Oral)   Ht 5' 6 (1.676 m)   Wt 199 lb (90.3 kg)   SpO2 98%   BMI 32.12 kg/m   SpO2: 98 %  GENERAL: Overweight, well-developed woman, no acute distress.  Fully ambulatory.  No conversational dyspnea. HEAD: Normocephalic, atraumatic.  EYES: Pupils equal, round, reactive to light.  No scleral icterus.  MOUTH: Edentulous upper with dentures.  Edentulous lower, no dentures.  No thrush.  Oral mucosa moist. NECK: Supple. No thyromegaly. Trachea midline. No JVD.  No adenopathy. PULMONARY: Good air entry bilaterally.  No adventitious sounds. CARDIOVASCULAR: S1 and S2. Regular rate and rhythm.  No rubs, murmurs or gallops heard. ABDOMEN: Mildly protuberant, otherwise benign. MUSCULOSKELETAL: No joint deformity, no clubbing, no edema.  NEUROLOGIC: No overt focal deficit, no gait  disturbance, speech is fluent. SKIN: Intact,warm,dry. PSYCH: Mood and behavior normal.  Lab Results  Component Value Date   NITRICOXIDE 15 05/14/2024  *Low level of type II inflammation consistent with good asthma control *Trend: 20>>15 ppb  ARISCAT score: 19, low risk (1.6% risk of in-hospital postop pulmonary complications)      Assessment & Plan:     ICD-10-CM   1. Mild intermittent asthma without complication  J45.20 Nitric oxide     2. Pneumatocele of lung  J98.4     3. CLL (chronic lymphocytic leukemia) (HCC)  C91.10     4. Preoperative respiratory examination  Z01.811       Orders Placed This Encounter  Procedures   Nitric oxide    Discussion:    Preoperative pulmonary evaluation for knee  replacement Preoperative pulmonary evaluation for upcoming knee replacement surgery on September 15. No recent respiratory issues. Lung function is good, and asthma is well controlled, indicating low risk for the procedure. - Nitric oxide  level low today indicating good asthma control - Ensure performance of breathing exercises post-procedure as instructed by therapy - Patient is low risk for the proposed procedure   Mild intermittent asthma Mild intermittent asthma, well controlled. No recent inhaler use and no significant respiratory symptoms. Low airway inflammation confirms good control. - Continue as needed use of inhaler, Airsupra   Pulmonary pneumatocele Pulmonary pneumatocele has been stable without significant change. No acute issues reported.  No hemoptysis.     Advised if symptoms do not improve or worsen, to please contact office for sooner follow up or seek emergency care.    I spent 30 minutes of dedicated to the care of this patient on the date of this encounter to include pre-visit review of records, face-to-face time with the patient discussing conditions above, post visit ordering of testing, clinical documentation with the electronic health record, making appropriate referrals as documented, and communicating necessary findings to members of the patients care team.     C. Leita Sanders, MD Advanced Bronchoscopy PCCM [facility]    *This note was generated using voice recognition software/Dragon and/or AI transcription program.  Despite best efforts to proofread, errors can occur which can change the meaning. Any transcriptional errors that result from this process are unintentional and may not be fully corrected at the time of dictation.

## 2024-05-19 NOTE — H&P (Signed)
 TOTAL KNEE ADMISSION H&P  Patient is being admitted for left total knee arthroplasty.  Subjective:  Chief Complaint: Left knee pain.  HPI: Natalie Eaton, 68 y.o. female has a history of pain and functional disability in the left knee due to arthritis and has failed non-surgical conservative treatments for greater than 12 weeks to include NSAID's and/or analgesics, corticosteriod injections, viscosupplementation injections, and activity modification. Onset of symptoms was gradual, starting several years ago with gradually worsening course since that time. The patient noted no past surgery on the left knee.  Patient currently rates pain in the left knee at 7 out of 10 with activity. Patient has worsening of pain with activity and weight bearing and pain that interferes with activities of daily living. Patient has evidence of periarticular osteophytes and joint space narrowing by imaging studies. There is no active infection.  Patient Active Problem List   Diagnosis Date Noted   Decreased pulses in feet 11/04/2023   Screening mammogram for breast cancer 09/08/2023   Post-menopausal 09/08/2023   Obstructive airway disease (HCC) 02/17/2023   Upper airway cough syndrome 02/17/2023   Herpes simplex vulvovaginitis 12/10/2022   Hepatic steatosis 10/22/2022   Left upper quadrant abdominal pain 10/15/2022   Hepatomegaly 10/15/2022   Pneumatocele of lung 10/15/2022   Ventral hernia without obstruction or gangrene 10/15/2022   Flank pain 10/07/2022   CLL (chronic lymphocytic leukemia) (HCC) 10/03/2022   Epigastric pain 09/20/2022   Chronic back pain 09/20/2022   Prediabetes 07/11/2022   Arthritis 06/10/2019   MVP (mitral valve prolapse) 06/10/2019   Statin intolerance 02/17/2019   Pure hypercholesterolemia 11/16/2018   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   Body mass index (BMI) 30.0-30.9, adult 02/24/2018   Essential hypertension, benign 11/21/2017   Hypothyroidism 11/21/2017     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 Authorizing Provider  Albuterol -Budesonide (AIRSUPRA ) 90-80 MCG/ACT AERO Inhale 2 puffs into the lungs every 4 (four) hours as needed. 09/04/23   Tamea Dedra CROME, MD  aspirin 81 MG chewable tablet Chew 81 mg by mouth daily.    [provider]  celecoxib  (CELEBREX ) 200 MG capsule Take 1 capsule (200 mg total) by mouth 2 (two) times daily. Patient taking differently: Take 200 mg by mouth 2 (two) times daily. Takes at bedtime 04/12/24   Drubel, Manuelita, PA-C  gabapentin  (NEURONTIN ) 300 MG capsule TAKE 1 CAPSULE BY MOUTH THREE TIMES A DAY 04/26/24   Cyndi Manuelita, PA-C  hydrochlorothiazide  (HYDRODIURIL ) 25 MG tablet Take 1 tablet (25 mg total) by mouth daily. 09/08/23  Emilio Kelly DASEN, FNP  levothyroxine  (SYNTHROID ) 75 MCG tablet TAKE 1 TABLET BY MOUTH EVERY DAY 08/04/23   Pardue, Lauraine SAILOR, DO  methocarbamol  (ROBAXIN ) 750 MG tablet Take 1 tablet (750 mg total) by mouth 2 (two) times daily as needed for muscle spasms. 03/17/23   Cyndi Shaver, PA-C  Multiple Vitamin (MULTIVITAMIN ADULT PO) Take 1 each by mouth daily at 2 PM.    [provider]  potassium chloride  (MICRO-K ) 10 MEQ CR capsule TAKE 1 CAPSULE BY MOUTH 2 TIMES DAILY. 12/01/23   Donzella Lauraine SAILOR, DO  RABEprazole  (ACIPHEX ) 20 MG tablet Take 1 tablet (20 mg total) by mouth daily. Patient not taking: Reported on 05/14/2024 03/02/24   Cyndi Shaver, PA-C  rosuvastatin  (CRESTOR ) 10 MG tablet Take 1 tablet (10 mg total) by mouth daily. 03/29/24   Cyndi Shaver, PA-C  traMADol (ULTRAM) 50 MG tablet Take 50 mg by mouth 3 (three) times daily.    [provider]  traZODone  (DESYREL ) 100 MG tablet Take 1 tablet (100 mg total) by mouth at bedtime. Patient taking differently: Take 100 mg by mouth at bedtime. Takes as needed 10/23/23   Donzella Lauraine SAILOR, DO  valACYclovir  (VALTREX ) 500 MG tablet Take 1 tablet (500 mg total) by mouth daily. Or BID for 3 days prn sx 02/12/23   Copland, Alicia B, PA-C    Allergies  Allergen Reactions   Codeine Other (See Comments)    Tingling on her scalp.    Social History   Socioeconomic History   Marital status: Widowed    Spouse name: Not on file   Number of children: 2   Years of education: Not on file   Highest education level: Associate degree: occupational, Scientist, product/process development, or vocational program  Occupational History   Not on file  Tobacco Use   Smoking status: Former    Current packs/day: 0.00    Average packs/day: 0.3 packs/day for 15.0 years (3.8 ttl pk-yrs)    Types: Cigarettes    Start date: 41    Quit date: 55    Years since quitting: 29.6   Smokeless tobacco: Never  Vaping Use   Vaping status: Never Used  Substance and Sexual Activity   Alcohol use: Yes    Alcohol/week: 1.0 standard drink of alcohol    Types: 1 Glasses of wine per week   Drug use: No   Sexual activity: Not Currently    Birth control/protection: None  Other Topics Concern   Not on file  Social History Narrative   Not on file   Social Drivers of Health   Financial Resource Strain: Medium Risk (11/03/2023)   Overall Financial Resource Strain (CARDIA)    Difficulty of Paying Living Expenses: Somewhat hard  Food Insecurity: Food Insecurity Present  (11/03/2023)   Hunger Vital Sign    Worried About Running Out of Food in the Last Year: Sometimes true    Ran Out of Food in the Last Year: Sometimes true  Transportation Needs: No Transportation Needs (11/03/2023)   PRAPARE - Administrator, Civil Service (Medical): No    Lack of Transportation (Non-Medical): No  Physical Activity: Inactive (11/03/2023)   Exercise Vital Sign    Days of Exercise per Week: 0 days    Minutes of Exercise per Session: 30 min  Stress: No Stress Concern Present (11/03/2023)   [employer] of Occupational Health - Occupational Stress Questionnaire    Feeling of Stress : Not at all  Social Connections: Unknown (11/03/2023)  Social Advertising account executive    Frequency of Communication with Friends and Family: Twice a week    Frequency of Social Gatherings with Friends and Family: Patient declined    Attends Religious Services: More than 4 times per year    Active Member of [employer] or Organizations: Yes    Attends Banker Meetings: More than 4 times per year    Marital Status: Widowed  Intimate Partner Violence: Not At Risk (02/25/2023)   Humiliation, Afraid, Rape, and Kick questionnaire    Fear of Current or Ex-Partner: No    Emotionally Abused: No    Physically Abused: No    Sexually Abused: No    Tobacco Use: Medium Risk (05/14/2024)   Patient History    Smoking Tobacco Use: Former    Smokeless Tobacco Use: Never    Passive Exposure: Not on file   Social History   Substance and Sexual Activity  Alcohol Use Yes   Alcohol/week: 1.0 standard drink of alcohol   Types: 1 Glasses of wine per week    Family History  Problem Relation Age of Onset   Scoliosis Mother    Heart disease Father        Died   Heart disease Brother    Healthy Daughter    Healthy Son     ROS  Objective:  Physical Exam: - Well-developed female alert and oriented in no apparent distress    - Left knee: Valgus deformity, range of motion  0-125 degrees, crepitus on range of motion, tenderness laterally with no instability.   - Right knee: No effusion, range of motion 0-125 degrees, no tenderness or instability.   - Left hip: Normal range of motion with no discomfort.   - Gait: Antalgic pattern on the left.    IMAGING:    Radiographs from the last visit, including AP views of both knees and lateral views of the left knee, demonstrate bone-on-bone arthritis in the lateral compartment with valgus deformity and near bone-on-bone patellofemoral arthritis.  Assessment/Plan:  End stage arthritis, left knee   The patient history, physical examination, clinical judgment of the provider and imaging studies are consistent with end stage degenerative joint disease of the left knee and total knee arthroplasty is deemed medically necessary. The treatment options including medical management, injection therapy arthroscopy and arthroplasty were discussed at length. The risks and benefits of total knee arthroplasty were presented and reviewed. The risks due to aseptic loosening, infection, stiffness, patella tracking problems, thromboembolic complications and other imponderables were discussed. The patient acknowledged the explanation, agreed to proceed with the plan and consent was signed. Patient is being admitted for inpatient treatment for surgery, pain control, PT, OT, prophylactic antibiotics, VTE prophylaxis, progressive ambulation and ADLs and discharge planning. The patient is planning to be discharged home.   Patient's anticipated LOS is less than 2 midnights, meeting these requirements: - Younger than 90 - Lives within 1 hour of care - Has a competent adult at home to recover with post-op recover - NO history of  - Chronic pain requiring opiods  - Diabetes  - Heart failure  - Heart attack  - Stroke  - DVT/VTE  - Cardiac arrhythmia  - Respiratory Failure/COPD  - Renal failure  - Anemia  - Advanced Liver disease   Therapy  Plans: [facility] in [place] Disposition: Home with sister and daughter Planned DVT Prophylaxis: Xarelto 10mg  DME Needed: RW ordered PCP: Manuelita Flatness PA-C (clearance received) Oncologist: Dr Babara (clearance received) Cardiologist:  Dorn Lesches, MD (clearance received) Pulmonologist: Dr Tamea & Almarie Ferrari, NP (clearance received) TXA: IV Allergies: codeine (itching in high doses) Anesthesia Concerns: None BMI: 30.6 Last HgbA1c: 6.4% on 02/16/24 Pharmacy: [facility] - deliver to room Pain Regimen: Oxycodone  Other: -Needs CTU -Continue aspirin perioperatively -Leukemia, under surveillance by oncology  - Patient was instructed on what medications to stop prior to surgery. - Follow-up visit in 2 weeks with Dr. Melodi - Begin physical therapy following surgery - Pre-operative lab work as pre-surgical testing - Prescriptions will be provided in hospital at time of discharge  Corean Sender, PA-C Orthopedic Surgery [facility] [place]

## 2024-05-21 DIAGNOSIS — M25662 Stiffness of left knee, not elsewhere classified: Secondary | ICD-10-CM | POA: Insufficient documentation

## 2024-05-21 DIAGNOSIS — M25562 Pain in left knee: Secondary | ICD-10-CM | POA: Insufficient documentation

## 2024-05-23 NOTE — Patient Instructions (Signed)
 SURGICAL WAITING ROOM VISITATION Patients having surgery or a procedure may have no more than 2 support people in the waiting area - these visitors may rotate in the visitor waiting room.   If the patient needs to stay at the hospital during part of their recovery, the visitor guidelines for inpatient rooms apply.  PRE-OP VISITATION  Pre-op nurse will coordinate an appropriate time for 1 support person to accompany the patient in pre-op.  This support person may not rotate.  This visitor will be contacted when the time is appropriate for the visitor to come back in the pre-op area.  Please refer to the [facility] website for the visitor guidelines for Inpatients (after your surgery is over and you are in a regular room).  You are not required to quarantine at this time prior to your surgery. However, you must do this: Hand Hygiene often Do NOT share personal items Notify your provider if you are in close contact with someone who has COVID or you develop fever 100.4 or greater, new onset of sneezing, cough, sore throat, shortness of breath or body aches.  If you test positive for Covid or have been in contact with anyone that has tested positive in the last 10 days please notify you surgeon.    Your procedure is scheduled on:  MONDAY  May 31, 2024  Report to [facility] Main Entrance: [address] entrance where the [employer] is available.   Report to admitting at: 08:00 AM  Call this number if you have any questions or problems the morning of surgery [phone]  Do not eat food after Midnight the night prior to your surgery/procedure.  After Midnight you may have the following liquids until  07:30 AM DAY OF SURGERY  Clear Liquid Diet Water Black Coffee (sugar ok, NO MILK/CREAM OR CREAMERS)  Tea (sugar ok, NO MILK/CREAM OR CREAMERS) regular and decaf                             Plain Jell-O  with no fruit (NO RED)                                           Fruit ices  (not with fruit pulp, NO RED)                                     Popsicles (NO RED)                                                                  Juice: NO CITRUS JUICES: only apple, WHITE grape, WHITE cranberry Sports drinks like Gatorade or Powerade (NO RED)                   The day of surgery:  Drink ONE (1) Pre-Surgery G2 at 07:30 AM the morning of surgery. Drink in one sitting. Do not sip.  This drink was given to you during your hospital pre-op appointment visit. Nothing else to drink after completing the Pre-Surgery G2 : No  candy, chewing gum or throat lozenges.    FOLLOW ANY ADDITIONAL PRE OP INSTRUCTIONS YOU RECEIVED FROM YOUR SURGEON'S OFFICE!!!   Oral Hygiene is also important to reduce your risk of infection.        Remember - BRUSH YOUR TEETH THE MORNING OF SURGERY WITH YOUR REGULAR TOOTHPASTE  Do NOT smoke after Midnight the night before surgery.  STOP TAKING all Vitamins, Herbs and supplements 1 week before your surgery.   Take ONLY these medicines the morning of surgery with A SIP OF WATER: levothyroxine  and you may take Tramadol if needed for pain.  DO NOT TAKE Hydrochlorothiazide  (HCTZ) the morning of your surgery                  You may not have any metal on your body including hair pins, jewelry, and body piercing  Do not wear make-up, lotions, powders, perfumes or deodorant  Do not wear nail polish including gel and S&S, artificial / acrylic nails, or any other type of covering on natural nails including finger and toenails. If you have artificial nails, gel coating, etc., that needs to be removed by a nail salon, Please have this removed prior to surgery. Not doing so may mean that your surgery could be cancelled or delayed if the Surgeon or anesthesia staff feels like they are unable to monitor you safely.   Do not shave 48 hours prior to surgery to avoid nicks in your skin which may contribute to postoperative infections.   Contacts, Hearing Aids,  dentures or bridgework may not be worn into surgery. DENTURES WILL BE REMOVED PRIOR TO SURGERY PLEASE DO NOT APPLY Poly grip OR ADHESIVES!!!  You may bring a small overnight bag with you on the day of surgery, only pack items that are not valuable.  IS NOT RESPONSIBLE   FOR VALUABLES THAT ARE LOST OR STOLEN.   Do not bring your home medications to the hospital. The Pharmacy will dispense medications listed on your medication list to you during your admission in the Hospital.  Please read over the following fact sheets you were given: IF YOU HAVE QUESTIONS ABOUT YOUR PRE-OP INSTRUCTIONS, PLEASE CALL [phone].     Pre-operative 5 CHG Bath Instructions   You can play a key role in reducing the risk of infection after surgery. Your skin needs to be as free of germs as possible. You can reduce the number of germs on your skin by washing with CHG (chlorhexidine gluconate) soap before surgery. CHG is an antiseptic soap that kills germs and continues to kill germs even after washing.   DO NOT use if you have an allergy to chlorhexidine/CHG or antibacterial soaps. If your skin becomes reddened or irritated, stop using the CHG and notify one of our RNs at [phone]  Please shower with the CHG soap starting 4 days before surgery using the following schedule: START SHOWERS ON  THURSDAY  May 27, 2024  Please keep in mind the following:  DO NOT shave, including legs and underarms, starting the day of your first shower.   You may shave your face at any point before/day of surgery.   Place clean sheets on your bed the day you start using CHG soap. Use a clean washcloth (not used since being washed) for each shower. DO NOT sleep with pets once you start using the CHG.   CHG Shower Instructions:  If you choose  to wash your hair and private area, wash first with your normal shampoo/soap.  After you use shampoo/soap, rinse your hair and body thoroughly to remove shampoo/soap residue.  Turn the water OFF and apply about 3 tablespoons (45 ml) of CHG soap to a CLEAN washcloth.  Apply CHG soap ONLY FROM YOUR NECK DOWN TO YOUR TOES (washing for 3-5 minutes)  DO NOT use CHG soap on face, private areas, open wounds, or sores.  Pay special attention to the area where your surgery is being performed.  If you are having back surgery, having someone wash your back for you may be helpful.  Wait 2 minutes after CHG soap is applied, then you may rinse off the CHG soap.  Pat dry with a clean towel  Put on clean clothes/pajamas   If you choose to wear lotion, please use ONLY the CHG-compatible lotions on the back of this paper.     Additional instructions for the day of surgery: DO NOT APPLY any lotions, deodorants, cologne, or perfumes.   Put on clean/comfortable clothes.  Brush your teeth.  Ask your nurse before applying any prescription medications to the skin.      CHG Compatible Lotions   Aveeno Moisturizing lotion  Cetaphil Moisturizing Cream  Cetaphil Moisturizing Lotion  Clairol Herbal Essence Moisturizing Lotion, Dry Skin  Clairol Herbal Essence Moisturizing Lotion, Extra Dry Skin  Clairol Herbal Essence Moisturizing Lotion, Normal Skin  Curel Age Defying Therapeutic Moisturizing Lotion with Alpha Hydroxy  Curel Extreme Care Body Lotion  Curel Soothing Hands Moisturizing Hand Lotion  Curel Therapeutic Moisturizing Cream, Fragrance-Free  Curel Therapeutic Moisturizing Lotion, Fragrance-Free  Curel Therapeutic Moisturizing Lotion, Original Formula  Eucerin Daily Replenishing Lotion  Eucerin Dry Skin Therapy Plus Alpha Hydroxy Crme  Eucerin Dry Skin Therapy Plus Alpha Hydroxy Lotion  Eucerin Original Crme  Eucerin Original Lotion  Eucerin Plus Crme Eucerin Plus Lotion  Eucerin TriLipid  Replenishing Lotion  Keri Anti-Bacterial Hand Lotion  Keri Deep Conditioning Original Lotion Dry Skin Formula Softly Scented  Keri Deep Conditioning Original Lotion, Fragrance Free Sensitive Skin Formula  Keri Lotion Fast Absorbing Fragrance Free Sensitive Skin Formula  Keri Lotion Fast Absorbing Softly Scented Dry Skin Formula  Keri Original Lotion  Keri Skin Renewal Lotion Keri Silky Smooth Lotion  Keri Silky Smooth Sensitive Skin Lotion  Nivea Body Creamy Conditioning Oil  Nivea Body Extra Enriched Lotion  Nivea Body Original Lotion  Nivea Body Sheer Moisturizing Lotion Nivea Crme  Nivea Skin Firming Lotion  NutraDerm 30 Skin Lotion  NutraDerm Skin Lotion  NutraDerm Therapeutic Skin Cream  NutraDerm Therapeutic Skin Lotion  ProShield Protective Hand Cream  Provon moisturizing lotion   FAILURE TO FOLLOW THESE INSTRUCTIONS MAY RESULT IN THE CANCELLATION OF YOUR SURGERY  PATIENT SIGNATURE_________________________________  NURSE SIGNATURE__________________________________  ________________________________________________________________________         Nasario Exon    An incentive spirometer is a tool that can help keep your lungs clear and active. This tool measures how well you are filling your lungs with each  breath. Taking long deep breaths may help reverse or decrease the chance of developing breathing (pulmonary) problems (especially infection) following: A long period of time when you are unable to move or be active. BEFORE THE PROCEDURE  If the spirometer includes an indicator to show your best effort, your nurse or respiratory therapist will set it to a desired goal. If possible, sit up straight or lean slightly forward. Try not to slouch. Hold the incentive spirometer in an upright position. INSTRUCTIONS FOR USE  Sit on the edge of your bed if possible, or sit up as far as you can in bed or on a chair. Hold the incentive spirometer in an upright  position. Breathe out normally. Place the mouthpiece in your mouth and seal your lips tightly around it. Breathe in slowly and as deeply as possible, raising the piston or the ball toward the top of the column. Hold your breath for 3-5 seconds or for as long as possible. Allow the piston or ball to fall to the bottom of the column. Remove the mouthpiece from your mouth and breathe out normally. Rest for a few seconds and repeat Steps 1 through 7 at least 10 times every 1-2 hours when you are awake. Take your time and take a few normal breaths between deep breaths. The spirometer may include an indicator to show your best effort. Use the indicator as a goal to work toward during each repetition. After each set of 10 deep breaths, practice coughing to be sure your lungs are clear. If you have an incision (the cut made at the time of surgery), support your incision when coughing by placing a pillow or rolled up towels firmly against it. Once you are able to get out of bed, walk around indoors and cough well. You may stop using the incentive spirometer when instructed by your caregiver.  RISKS AND COMPLICATIONS Take your time so you do not get dizzy or light-headed. If you are in pain, you may need to take or ask for pain medication before doing incentive spirometry. It is harder to take a deep breath if you are having pain. AFTER USE Rest and breathe slowly and easily. It can be helpful to keep track of a log of your progress. Your caregiver can provide you with a simple table to help with this. If you are using the spirometer at home, follow these instructions: SEEK MEDICAL CARE IF:  You are having difficultly using the spirometer. You have trouble using the spirometer as often as instructed. Your pain medication is not giving enough relief while using the spirometer. You develop fever of 100.5 F (38.1 C) or higher.                                                                                                     SEEK IMMEDIATE MEDICAL CARE IF:  You cough up bloody sputum that had not been present before. You develop fever of 102 F (38.9 C) or greater. You develop worsening pain at or near the incision site. MAKE SURE YOU:  Understand these instructions. Will watch  your condition. Will get help right away if you are not doing well or get worse. Document Released: 01/13/2007 Document Revised: 11/25/2011 Document Reviewed: 03/16/2007 [facility] Patient Information 2014 [place], [place].        If you would like to see a video about joint replacement:   [email or web address]

## 2024-05-23 NOTE — Progress Notes (Signed)
 COVID Vaccine received:  []  No [x]  Yes Date of any COVID positive Test in last 90 days:  PCP - Manuelita Flatness, PA-C Cardiologist - Dorn Lesches, MD, Barnie Hila, NP cardiac clearance in 03-03-24 note  Oncology- Zelphia Cap, MD clearance scanned to media PM&R-  Charlie Dolores, MD Pulmonology- Dedra Skelton, MD clearance in 05-14-24 Epic note   Chest x-ray - 09-01-2020  2v  Epic  01-20-2023 CT chest w/o contrast EKG -  11-04-2023  Epic Stress Test -  ECHO - 02-18-2024  Epic Cardiac Cath -  done in [place]  ? 8-10 yrs ago nonobstructive CAD per dictation CT Coronary Calcium  score:  Zio monitor- 06-21-2020  Epic  Pacemaker / ICD device [x]  No []  Yes   Spinal Cord Stimulator:[x]  No []  Yes       History of Sleep Apnea? [x]  No []  Yes   CPAP used?- [x]  No []  Yes    Patient has: []  NO Hx DM   [x]  Pre-DM   []  DM1  []   DM2 Does the patient monitor blood sugar?   []  N/A   [x]  No []  Yes  Last A1c was: 6.4 on 02-16-24    No Meds  Blood Thinner / Instructions:none Aspirin Instructions:   ASA 81 mg   ? Continue  ERAS Protocol Ordered: []  No  [x]  Yes PRE-SURGERY []  ENSURE  [x]  G2   Patient is to be NPO after: 0730  Dental hx: [x]  Dentures: full set []  N/A      []  Bridge or Partial:                   []  Loose or Damaged teeth:   Comments: Patient was given the 5 CHG shower / bath instructions for TKA surgery along with 2 bottles of the CHG soap. Patient will start this on:  05-27-24  Thursday            Activity level: Able to walk up 2 flights of stairs without becoming significantly short of breath or having chest pain?  []  No   []    Yes  Patient can perform ADLs without assistance. []  No   []   Yes  Anesthesia review: MVP/ murmur (ECHO 02-2024), CAD, HTN, CLL, Right lung Pneumatocele- partial lobectomy??,  Hx closed TBI - Epilepsy??? Seizures???  Patient denies any S&S of respiratory illness or Covid - no shortness of breath, fever, cough or chest pain at PAT appointment.  Patient verbalized  understanding and agreement to the Pre-Surgical Instructions that were given to them at this PAT appointment. Patient was also educated of the need to review these PAT instructions again prior to her surgery.I reviewed the appropriate phone numbers to call if they have any and questions or concerns.

## 2024-05-25 ENCOUNTER — Encounter (HOSPITAL_COMMUNITY): Payer: Self-pay

## 2024-05-25 ENCOUNTER — Other Ambulatory Visit: Payer: Self-pay

## 2024-05-25 ENCOUNTER — Encounter (HOSPITAL_COMMUNITY)
Admission: RE | Admit: 2024-05-25 | Discharge: 2024-05-25 | Disposition: A | Discharge status: Home / Self Care | Source: Ambulatory Visit | Attending: Orthopedic Surgery | Admitting: Orthopedic Surgery

## 2024-05-25 VITALS — BP 122/70 | HR 74 | Temp 98.4°F | Resp 22 | Ht 66.0 in | Wt 197.8 lb

## 2024-05-25 DIAGNOSIS — I2510 Atherosclerotic heart disease of native coronary artery without angina pectoris: Secondary | ICD-10-CM | POA: Diagnosis not present

## 2024-05-25 DIAGNOSIS — E039 Hypothyroidism, unspecified: Secondary | ICD-10-CM | POA: Diagnosis not present

## 2024-05-25 DIAGNOSIS — Z01812 Encounter for preprocedural laboratory examination: Secondary | ICD-10-CM | POA: Insufficient documentation

## 2024-05-25 DIAGNOSIS — M1712 Unilateral primary osteoarthritis, left knee: Secondary | ICD-10-CM | POA: Diagnosis not present

## 2024-05-25 DIAGNOSIS — I10 Essential (primary) hypertension: Secondary | ICD-10-CM | POA: Insufficient documentation

## 2024-05-25 DIAGNOSIS — C9110 Chronic lymphocytic leukemia of B-cell type not having achieved remission: Secondary | ICD-10-CM | POA: Insufficient documentation

## 2024-05-25 DIAGNOSIS — R160 Hepatomegaly, not elsewhere classified: Secondary | ICD-10-CM | POA: Insufficient documentation

## 2024-05-25 DIAGNOSIS — J984 Other disorders of lung: Secondary | ICD-10-CM | POA: Diagnosis not present

## 2024-05-25 DIAGNOSIS — Z01818 Encounter for other preprocedural examination: Secondary | ICD-10-CM | POA: Diagnosis present

## 2024-05-25 HISTORY — DX: Chronic pain syndrome: G89.4

## 2024-05-25 LAB — COMPREHENSIVE METABOLIC PANEL WITH GFR
ALT: 22 U/L (ref 0–44)
AST: 22 U/L (ref 15–41)
Albumin: 4.7 g/dL (ref 3.5–5.0)
Alkaline Phosphatase: 73 U/L (ref 38–126)
Anion gap: 13 (ref 5–15)
BUN: 15 mg/dL (ref 8–23)
CO2: 28 mmol/L (ref 22–32)
Calcium: 10.7 mg/dL — ABNORMAL HIGH (ref 8.9–10.3)
Chloride: 99 mmol/L (ref 98–111)
Creatinine, Ser: 0.76 mg/dL (ref 0.44–1.00)
GFR, Estimated: >60 mL/min (ref >60)
Glucose, Bld: 112 mg/dL — ABNORMAL HIGH (ref 70–99)
Potassium: 3.5 mmol/L (ref 3.5–5.1)
Sodium: 139 mmol/L (ref 135–145)
Total Bilirubin: 0.4 mg/dL (ref 0.0–1.2)
Total Protein: 7.1 g/dL (ref 6.5–8.1)

## 2024-05-25 LAB — CBC
HCT: 45.5 % (ref 36.0–46.0)
Hemoglobin: 14.6 g/dL (ref 12.0–15.0)
MCH: 30.4 pg (ref 26.0–34.0)
MCHC: 32.1 g/dL (ref 30.0–36.0)
MCV: 94.8 fL (ref 80.0–100.0)
Platelets: 227 K/uL (ref 150–400)
RBC: 4.8 MIL/uL (ref 3.87–5.11)
RDW: 13.4 % (ref 11.5–15.5)
WBC: 11.8 K/uL — ABNORMAL HIGH (ref 4.0–10.5)
nRBC: 0 % (ref 0.0–0.2)

## 2024-05-25 LAB — SURGICAL PCR SCREEN
MRSA, PCR: NEGATIVE
Staphylococcus aureus: NEGATIVE

## 2024-05-27 ENCOUNTER — Telehealth: Payer: Self-pay | Admitting: *Deleted

## 2024-05-27 NOTE — Progress Notes (Signed)
 Anesthesia Chart Review   Case: [id number] Date/Time: 05/31/24 1015   Procedure: ARTHROPLASTY, KNEE, TOTAL (Left: Knee)   Anesthesia type: Choice   Pre-op diagnosis: Left knee osteoarthritis   Location: [facility] ROOM 10 / [facility]   Surgeons: Melodi Lerner, MD       DISCUSSION:68 y.o. former smoker with h/o HTN, hypothyroidism, CLL (no symptoms, watchful waiting per oncology), pneumatocele of lung, CAD, left knee OA scheduled for above procedure 05/31/2024 with Dr. Lerner Melodi.   02/07/2023 PFTs: FEV1 2.09 L or 78% predicted, FVC 2.71 L or 77% predicted, FEV1/FVC 77%, there is bronchodilator response with regards to airway resistance.  Lung volumes normal ERV 1% consistent with obesity.  Diffusion capacity normal.  Consistent with a minimal/mild obstructive airways disease with reversible component (asthmatic type).   Pt last seen by pulmonology 05/14/2024. Per OV note, Preoperative pulmonary evaluation for upcoming knee replacement surgery on September 15. No recent respiratory issues. Lung function is good, and asthma is well controlled, indicating low risk for the procedure. - Nitric oxide  level low today indicating good asthma control - Ensure performance of breathing exercises post-procedure as instructed by therapy - Patient is low risk for the proposed procedure  Per cardiology preoperative evaluation 03/03/2024, Chart reviewed as part of pre-operative protocol coverage. Natalie Eaton was last seen on 01/13/2024 by Lum Louis, NP.  Per [facility] She was able to complete greater than 4 METS during office visit.  She was without anginal symptoms.  She is able to proceed with procedure without further cardiovascular testing.   Therefore, based on ACC/AHA guidelines, the patient would be an acceptable risk for the planned procedure without further cardiovascular testing.    Ideally aspirin should be continued without interruption, however if the bleeding risk is too great, aspirin may be  held for 5-7 days prior to surgery. Please resume aspirin post operatively when it is felt to be safe from a bleeding standpoint.     VS: BP 122/70 Comment: right arm sitting  Pulse 74   Temp 36.9 C (Oral)   Resp (!) 22   Ht 5' 6 (1.676 m)   Wt 89.7 kg   SpO2 100%   BMI 31.93 kg/m   PROVIDERS: Cyndi Shaver, PA-C is PCP   Pulmonology- Dedra Skelton, MD   Cardiologist - Dorn Lesches, MD   LABS: Labs reviewed: Acceptable for surgery. (all labs ordered are listed, but only abnormal results are displayed)  Labs Reviewed  COMPREHENSIVE METABOLIC PANEL WITH GFR - Abnormal; Notable for the following components:      Result Value   Glucose, Bld 112 (*)    Calcium  10.7 (*)    All other components within normal limits  CBC - Abnormal; Notable for the following components:   WBC 11.8 (*)    All other components within normal limits  SURGICAL PCR SCREEN     IMAGES:   EKG:   CV: Echo 02/18/2024 1. Left ventricular ejection fraction, by estimation, is 60 to 65%. The  left ventricle has normal function. The left ventricle has no regional  wall motion abnormalities. Left ventricular diastolic parameters are  consistent with Grade I diastolic  dysfunction (impaired relaxation).   2. Right ventricular systolic function is normal. The right ventricular  size is normal. There is normal pulmonary artery systolic pressure.   3. The mitral valve is normal in structure. Trivial mitral valve  regurgitation. No evidence of mitral stenosis.   4. The aortic valve is tricuspid. Aortic valve regurgitation is  not  visualized. No aortic stenosis is present.   5. The inferior vena cava is normal in size with greater than 50%  respiratory variability, suggesting right atrial pressure of 3 mmHg.   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, had lobectomy,   still has stable cyst in right lung   Pneumonia    Pre-diabetes     Past Surgical History:  Procedure Laterality Date   ABDOMINAL HYSTERECTOMY     BLADDER SURGERY     CARDIAC CATHETERIZATION     done in [place]  ~ 8-10 yrs ago nonobstructive CAD per dictation   GALLBLADDER SURGERY     LUNG REMOVAL, PARTIAL Right 1960   for pneumatocele   ROTATOR CUFF REPAIR Right    TONSILLECTOMY     TUBAL LIGATION      MEDICATIONS:  Albuterol -Budesonide (AIRSUPRA ) 90-80 MCG/ACT AERO   aspirin 81 MG chewable tablet   celecoxib  (CELEBREX ) 200 MG capsule   gabapentin  (NEURONTIN ) 300 MG capsule   hydrochlorothiazide  (HYDRODIURIL ) 25 MG tablet   levothyroxine  (SYNTHROID ) 75 MCG tablet   methocarbamol  (ROBAXIN ) 750 MG tablet   Multiple Vitamin (MULTIVITAMIN ADULT PO)   potassium chloride  (MICRO-K ) 10 MEQ CR capsule   RABEprazole  (ACIPHEX ) 20 MG tablet   rosuvastatin  (CRESTOR ) 10 MG tablet   traMADol (ULTRAM) 50 MG tablet   traZODone  (DESYREL ) 100 MG tablet   valACYclovir  (VALTREX ) 500 MG tablet   No current facility-administered medications for this encounter.    Harlene Hoots Ward, PA-C [facility] [phone]

## 2024-05-27 NOTE — Telephone Encounter (Signed)
 Copied from CRM #[id number]. Topic: Clinical - Medication Question >> May 27, 2024 12:52 PM Burnard DEL wrote: Reason for CRM: Patient called in stating that hydrochlorothiazide  (HYDRODIURIL ) 25 MG tablet makes her sleepy. She would like to know if she should cut it in half or take at night time? Please advise.

## 2024-05-27 NOTE — Anesthesia Preprocedure Evaluation (Addendum)
 Anesthesia Evaluation  Patient identified by MRN, date of birth, ID band Patient awake    Reviewed: Allergy & Precautions, NPO status , Patient's Chart, lab work & pertinent test results  Airway Mallampati: II  TM Distance: >3 FB Neck ROM: Full    Dental  (+) Edentulous Upper, Edentulous Lower, Dental Advisory Given   Pulmonary pneumonia, former smoker R partial lung resection at 4 yrs   Pulmonary exam normal breath sounds clear to auscultation       Cardiovascular hypertension, Pt. on medications + CAD  + Valvular Problems/Murmurs  Rhythm:Regular Rate:Normal  Echo 02/2024  1. Left ventricular ejection fraction, by estimation, is 60 to 65%. The  left ventricle has normal function. The left ventricle has no regional  wall motion abnormalities. Left ventricular diastolic parameters are  consistent with Grade I diastolic dysfunction (impaired relaxation).   2. Right ventricular systolic function is normal. The right ventricular  size is normal. There is normal pulmonary artery systolic pressure.   3. The mitral valve is normal in structure. Trivial mitral valve  regurgitation. No evidence of mitral stenosis.   4. The aortic valve is tricuspid. Aortic valve regurgitation is not  visualized. No aortic stenosis is present.   5. The inferior vena cava is normal in size with greater than 50%  respiratory variability, suggesting right atrial pressure of 3 mmHg.      Neuro/Psych Seizures -, Well Controlled,     GI/Hepatic Neg liver ROS,GERD  Controlled,,  Endo/Other  Hypothyroidism    Renal/GU negative Renal ROS     Musculoskeletal  (+) Arthritis ,    Abdominal  (+) + obese  Peds  Hematology negative hematology ROS (+)   Anesthesia Other Findings   Reproductive/Obstetrics                              Anesthesia Physical Anesthesia Plan  ASA: 3  Anesthesia Plan: General   Post-op Pain  Management: Ofirmev  IV (intra-op)*, Regional block* and Gabapentin  PO (pre-op)*   Induction:   PONV Risk Score and Plan: 4 or greater and Ondansetron , Dexamethasone , Treatment may vary due to age or medical condition, TIVA and Propofol  infusion  Airway Management Planned: Oral ETT  Additional Equipment:   Intra-op Plan:   Post-operative Plan: Extubation in OR  Informed Consent: I have reviewed the patients History and Physical, chart, labs and discussed the procedure including the risks, benefits and alternatives for the proposed anesthesia with the patient or authorized representative who has indicated his/her understanding and acceptance.     Dental advisory given  Plan Discussed with: CRNA  Anesthesia Plan Comments: (Pt. Refused spinal. Plan GA  See PAT note 05/25/2024  Risks of anesthesia explained at length. This includes, but is not limited to, sore throat, damage to teeth, lips gums, tongue and vocal cords, nausea and vomiting, reactions to medications, stroke, heart attack, and death. All patient questions were answered and the patient wishes to proceed. Risks of peripheral nerve block explained at length. This includes, but is not limited to, bleeding, infection, reactions to the medications, seizures, damage to surrounding structures, damage to nerves, permanent weakness, numbness, tingling and pain. All patient questions were answered and patient wishes to proceed with nerve block.  )         Anesthesia Quick Evaluation

## 2024-05-31 ENCOUNTER — Other Ambulatory Visit: Payer: Self-pay

## 2024-05-31 ENCOUNTER — Encounter (HOSPITAL_COMMUNITY)
Admission: RE | Disposition: A | Discharge status: Home / Self Care | Payer: Self-pay | Source: Ambulatory Visit | Attending: Orthopedic Surgery

## 2024-05-31 ENCOUNTER — Ambulatory Visit (HOSPITAL_BASED_OUTPATIENT_CLINIC_OR_DEPARTMENT_OTHER): Payer: Self-pay | Admitting: Anesthesiology

## 2024-05-31 ENCOUNTER — Ambulatory Visit (HOSPITAL_COMMUNITY): Payer: Self-pay | Admitting: Medical

## 2024-05-31 ENCOUNTER — Encounter (HOSPITAL_COMMUNITY): Payer: Self-pay | Admitting: Orthopedic Surgery

## 2024-05-31 ENCOUNTER — Observation Stay (HOSPITAL_COMMUNITY)
Admission: RE | Admit: 2024-05-31 | Discharge: 2024-06-01 | Disposition: A | Discharge status: Home / Self Care | Source: Ambulatory Visit | Attending: Orthopedic Surgery | Admitting: Orthopedic Surgery

## 2024-05-31 DIAGNOSIS — Z856 Personal history of leukemia: Secondary | ICD-10-CM | POA: Insufficient documentation

## 2024-05-31 DIAGNOSIS — I10 Essential (primary) hypertension: Secondary | ICD-10-CM | POA: Diagnosis not present

## 2024-05-31 DIAGNOSIS — I2510 Atherosclerotic heart disease of native coronary artery without angina pectoris: Secondary | ICD-10-CM | POA: Insufficient documentation

## 2024-05-31 DIAGNOSIS — Z87891 Personal history of nicotine dependence: Secondary | ICD-10-CM | POA: Insufficient documentation

## 2024-05-31 DIAGNOSIS — E039 Hypothyroidism, unspecified: Secondary | ICD-10-CM | POA: Diagnosis not present

## 2024-05-31 DIAGNOSIS — M1712 Unilateral primary osteoarthritis, left knee: Principal | ICD-10-CM | POA: Insufficient documentation

## 2024-05-31 DIAGNOSIS — Z79899 Other long term (current) drug therapy: Secondary | ICD-10-CM | POA: Insufficient documentation

## 2024-05-31 DIAGNOSIS — M179 Osteoarthritis of knee, unspecified: Principal | ICD-10-CM | POA: Diagnosis present

## 2024-05-31 DIAGNOSIS — I341 Nonrheumatic mitral (valve) prolapse: Secondary | ICD-10-CM

## 2024-05-31 DIAGNOSIS — M25562 Pain in left knee: Secondary | ICD-10-CM | POA: Diagnosis present

## 2024-05-31 HISTORY — PX: TOTAL KNEE ARTHROPLASTY: SHX125

## 2024-05-31 SURGERY — ARTHROPLASTY, KNEE, TOTAL
Anesthesia: General | Site: Knee | Laterality: Left

## 2024-05-31 MED ORDER — BISACODYL 10 MG RE SUPP: 10.0000 mg | Freq: Every day | RECTAL | Status: DC | PRN

## 2024-05-31 MED ORDER — SODIUM CHLORIDE (PF) 0.9 % IJ SOLN
INTRAMUSCULAR | Status: AC
  Filled 2024-05-31: qty 50

## 2024-05-31 MED ORDER — HYDROMORPHONE HCL 1 MG/ML IJ SOLN
0.2500 mg | INTRAMUSCULAR | Status: DC | PRN
  Administered 2024-05-31 (×2): 0.5 mg via INTRAVENOUS

## 2024-05-31 MED ORDER — METOCLOPRAMIDE HCL 5 MG PO TABS: 5.0000 mg | ORAL_TABLET | Freq: Three times a day (TID) | ORAL | Status: DC | PRN

## 2024-05-31 MED ORDER — FENTANYL CITRATE (PF) 250 MCG/5ML IJ SOLN
INTRAMUSCULAR | Status: AC
  Filled 2024-05-31: qty 5

## 2024-05-31 MED ORDER — LACTATED RINGERS IV SOLN: INTRAVENOUS | Status: DC

## 2024-05-31 MED ORDER — ONDANSETRON HCL 4 MG/2ML IJ SOLN
INTRAMUSCULAR | Status: AC
  Filled 2024-05-31: qty 2

## 2024-05-31 MED ORDER — SODIUM CHLORIDE 0.9 % IR SOLN
Status: DC | PRN
  Administered 2024-05-31: 1000 mL

## 2024-05-31 MED ORDER — HYDROMORPHONE HCL 1 MG/ML IJ SOLN
INTRAMUSCULAR | Status: AC
  Filled 2024-05-31: qty 1

## 2024-05-31 MED ORDER — FENTANYL CITRATE (PF) 100 MCG/2ML IJ SOLN
INTRAMUSCULAR | Status: DC | PRN
  Administered 2024-05-31 (×4): 50 ug via INTRAVENOUS

## 2024-05-31 MED ORDER — ACETAMINOPHEN 500 MG PO TABS
1000.0000 mg | ORAL_TABLET | Freq: Four times a day (QID) | ORAL | Status: DC
  Administered 2024-05-31 – 2024-06-01 (×3): 1000 mg via ORAL
  Filled 2024-05-31 (×4): qty 2

## 2024-05-31 MED ORDER — MIDAZOLAM HCL 2 MG/2ML IJ SOLN
INTRAMUSCULAR | Status: AC
  Filled 2024-05-31: qty 2

## 2024-05-31 MED ORDER — ONDANSETRON HCL 4 MG PO TABS: 4.0000 mg | ORAL_TABLET | Freq: Four times a day (QID) | ORAL | Status: DC | PRN

## 2024-05-31 MED ORDER — RIVAROXABAN 10 MG PO TABS
10.0000 mg | ORAL_TABLET | Freq: Every day | ORAL | Status: DC
  Administered 2024-06-01: 10 mg via ORAL
  Filled 2024-05-31: qty 1

## 2024-05-31 MED ORDER — DEXAMETHASONE SODIUM PHOSPHATE 4 MG/ML IJ SOLN
INTRAMUSCULAR | Status: DC | PRN
  Administered 2024-05-31: 4 mg via PERINEURAL

## 2024-05-31 MED ORDER — LIDOCAINE HCL (CARDIAC) PF 100 MG/5ML IV SOSY
PREFILLED_SYRINGE | INTRAVENOUS | Status: DC | PRN
  Administered 2024-05-31: 50 mg via INTRAVENOUS

## 2024-05-31 MED ORDER — PHENYLEPHRINE HCL-NACL 20-0.9 MG/250ML-% IV SOLN
INTRAVENOUS | Status: DC | PRN
Start: 2024-05-31 — End: 2024-05-31
  Administered 2024-05-31: 40 ug/min via INTRAVENOUS

## 2024-05-31 MED ORDER — GABAPENTIN 300 MG PO CAPS
300.0000 mg | ORAL_CAPSULE | Freq: Once | ORAL | Status: AC
  Administered 2024-05-31: 300 mg via ORAL
  Filled 2024-05-31: qty 1

## 2024-05-31 MED ORDER — POVIDONE-IODINE 10 % EX SWAB
2.0000 | Freq: Once | CUTANEOUS | Status: AC
Start: 2024-05-31 — End: 2024-05-31
  Administered 2024-05-31: 2 via TOPICAL

## 2024-05-31 MED ORDER — BUPIVACAINE LIPOSOME 1.3 % IJ SUSP: 20.0000 mL | Freq: Once | INTRAMUSCULAR | Status: DC

## 2024-05-31 MED ORDER — SUGAMMADEX SODIUM 200 MG/2ML IV SOLN
INTRAVENOUS | Status: DC | PRN
  Administered 2024-05-31: 200 mg via INTRAVENOUS

## 2024-05-31 MED ORDER — ONDANSETRON HCL 4 MG/2ML IJ SOLN: 4.0000 mg | Freq: Four times a day (QID) | INTRAMUSCULAR | Status: DC | PRN

## 2024-05-31 MED ORDER — PROPOFOL 1000 MG/100ML IV EMUL
INTRAVENOUS | Status: AC
  Filled 2024-05-31: qty 200

## 2024-05-31 MED ORDER — METHOCARBAMOL 500 MG PO TABS
500.0000 mg | ORAL_TABLET | Freq: Four times a day (QID) | ORAL | Status: DC | PRN
  Administered 2024-05-31 – 2024-06-01 (×3): 500 mg via ORAL
  Filled 2024-05-31 (×3): qty 1

## 2024-05-31 MED ORDER — GLYCOPYRROLATE 0.2 MG/ML IJ SOLN
INTRAMUSCULAR | Status: DC | PRN
  Administered 2024-05-31: .2 mg via INTRAVENOUS

## 2024-05-31 MED ORDER — SODIUM CHLORIDE 0.9 % IV SOLN: INTRAVENOUS | Status: DC

## 2024-05-31 MED ORDER — ONDANSETRON HCL 4 MG/2ML IJ SOLN
INTRAMUSCULAR | Status: DC | PRN
  Administered 2024-05-31: 4 mg via INTRAVENOUS

## 2024-05-31 MED ORDER — HYDROMORPHONE HCL 2 MG PO TABS
2.0000 mg | ORAL_TABLET | ORAL | Status: DC | PRN
  Administered 2024-05-31 – 2024-06-01 (×4): 4 mg via ORAL
  Filled 2024-05-31 (×4): qty 2

## 2024-05-31 MED ORDER — DEXAMETHASONE SODIUM PHOSPHATE 10 MG/ML IJ SOLN
INTRAMUSCULAR | Status: AC
  Filled 2024-05-31: qty 1

## 2024-05-31 MED ORDER — ROSUVASTATIN CALCIUM 10 MG PO TABS
10.0000 mg | ORAL_TABLET | Freq: Every day | ORAL | Status: DC
  Administered 2024-06-01: 10 mg via ORAL
  Filled 2024-05-31: qty 1

## 2024-05-31 MED ORDER — POTASSIUM CHLORIDE CRYS ER 10 MEQ PO TBCR
10.0000 meq | EXTENDED_RELEASE_TABLET | Freq: Two times a day (BID) | ORAL | Status: DC
  Administered 2024-05-31 – 2024-06-01 (×2): 10 meq via ORAL
  Filled 2024-05-31 (×2): qty 1

## 2024-05-31 MED ORDER — CEFAZOLIN SODIUM-DEXTROSE 2-4 GM/100ML-% IV SOLN
2.0000 g | Freq: Four times a day (QID) | INTRAVENOUS | Status: AC
  Administered 2024-05-31 – 2024-06-01 (×2): 2 g via INTRAVENOUS
  Filled 2024-05-31 (×2): qty 100

## 2024-05-31 MED ORDER — LEVOTHYROXINE SODIUM 75 MCG PO TABS
75.0000 ug | ORAL_TABLET | Freq: Every day | ORAL | Status: DC
  Administered 2024-06-01: 75 ug via ORAL
  Filled 2024-05-31: qty 1

## 2024-05-31 MED ORDER — FENTANYL CITRATE PF 50 MCG/ML IJ SOSY
100.0000 ug | PREFILLED_SYRINGE | INTRAMUSCULAR | Status: DC
  Administered 2024-05-31: 50 ug via INTRAVENOUS
  Filled 2024-05-31: qty 2

## 2024-05-31 MED ORDER — PROPOFOL 500 MG/50ML IV EMUL
INTRAVENOUS | Status: DC | PRN
Start: 2024-05-31 — End: 2024-05-31
  Administered 2024-05-31: 150 ug/kg/min via INTRAVENOUS

## 2024-05-31 MED ORDER — LIDOCAINE HCL (PF) 2 % IJ SOLN
INTRAMUSCULAR | Status: AC
  Filled 2024-05-31: qty 5

## 2024-05-31 MED ORDER — POLYETHYLENE GLYCOL 3350 17 G PO PACK: 17.0000 g | PACK | Freq: Every day | ORAL | Status: DC | PRN

## 2024-05-31 MED ORDER — BUPIVACAINE LIPOSOME 1.3 % IJ SUSP
INTRAMUSCULAR | Status: AC
  Filled 2024-05-31: qty 20

## 2024-05-31 MED ORDER — ACETAMINOPHEN 10 MG/ML IV SOLN
1000.0000 mg | Freq: Once | INTRAVENOUS | Status: AC
  Administered 2024-05-31: 1000 mg via INTRAVENOUS
  Filled 2024-05-31: qty 100

## 2024-05-31 MED ORDER — SODIUM CHLORIDE (PF) 0.9 % IJ SOLN
INTRAMUSCULAR | Status: AC
  Filled 2024-05-31: qty 10

## 2024-05-31 MED ORDER — MENTHOL 3 MG MT LOZG: 1.0000 | LOZENGE | OROMUCOSAL | Status: DC | PRN

## 2024-05-31 MED ORDER — MORPHINE SULFATE (PF) 2 MG/ML IV SOLN
1.0000 mg | INTRAVENOUS | Status: DC | PRN
  Administered 2024-05-31 – 2024-06-01 (×3): 2 mg via INTRAVENOUS
  Filled 2024-05-31 (×3): qty 1

## 2024-05-31 MED ORDER — METHOCARBAMOL 1000 MG/10ML IJ SOLN: 500.0000 mg | Freq: Four times a day (QID) | INTRAMUSCULAR | Status: DC | PRN

## 2024-05-31 MED ORDER — DROPERIDOL 2.5 MG/ML IJ SOLN: 0.6250 mg | Freq: Once | INTRAMUSCULAR | Status: DC | PRN

## 2024-05-31 MED ORDER — 0.9 % SODIUM CHLORIDE (POUR BTL) OPTIME
TOPICAL | Status: DC | PRN
  Administered 2024-05-31: 1000 mL

## 2024-05-31 MED ORDER — GABAPENTIN 300 MG PO CAPS
300.0000 mg | ORAL_CAPSULE | Freq: Once | ORAL | Status: DC
Start: 2024-05-31 — End: 2024-05-31

## 2024-05-31 MED ORDER — TRANEXAMIC ACID-NACL 1000-0.7 MG/100ML-% IV SOLN
1000.0000 mg | INTRAVENOUS | Status: AC
  Administered 2024-05-31: 1000 mg via INTRAVENOUS
  Filled 2024-05-31: qty 100

## 2024-05-31 MED ORDER — GLYCOPYRROLATE 0.2 MG/ML IJ SOLN
INTRAMUSCULAR | Status: AC
  Filled 2024-05-31: qty 1

## 2024-05-31 MED ORDER — ROCURONIUM BROMIDE 10 MG/ML (PF) SYRINGE
PREFILLED_SYRINGE | INTRAVENOUS | Status: AC
  Filled 2024-05-31: qty 10

## 2024-05-31 MED ORDER — HYDROMORPHONE HCL 1 MG/ML IJ SOLN
1.0000 mg | INTRAMUSCULAR | Status: DC | PRN
  Administered 2024-05-31 – 2024-06-01 (×2): 1 mg via INTRAVENOUS
  Filled 2024-05-31 (×2): qty 1

## 2024-05-31 MED ORDER — SODIUM CHLORIDE 0.9 % IV SOLN
INTRAVENOUS | Status: DC | PRN
  Administered 2024-05-31: 80 mL

## 2024-05-31 MED ORDER — DOCUSATE SODIUM 100 MG PO CAPS
100.0000 mg | ORAL_CAPSULE | Freq: Two times a day (BID) | ORAL | Status: DC
  Administered 2024-05-31 – 2024-06-01 (×2): 100 mg via ORAL
  Filled 2024-05-31 (×2): qty 1

## 2024-05-31 MED ORDER — ACETAMINOPHEN 325 MG PO TABS: 325.0000 mg | ORAL_TABLET | Freq: Four times a day (QID) | ORAL | Status: DC | PRN

## 2024-05-31 MED ORDER — DEXAMETHASONE SODIUM PHOSPHATE 10 MG/ML IJ SOLN
10.0000 mg | Freq: Once | INTRAMUSCULAR | Status: AC
  Administered 2024-06-01: 10 mg via INTRAVENOUS
  Filled 2024-05-31: qty 1

## 2024-05-31 MED ORDER — PHENOL 1.4 % MT LIQD: 1.0000 | OROMUCOSAL | Status: DC | PRN

## 2024-05-31 MED ORDER — GABAPENTIN 300 MG PO CAPS
600.0000 mg | ORAL_CAPSULE | Freq: Every day | ORAL | Status: DC
  Filled 2024-05-31: qty 2

## 2024-05-31 MED ORDER — FLEET ENEMA RE ENEM: 1.0000 | ENEMA | Freq: Once | RECTAL | Status: DC | PRN

## 2024-05-31 MED ORDER — CHLORHEXIDINE GLUCONATE 0.12 % MT SOLN
15.0000 mL | Freq: Once | OROMUCOSAL | Status: AC
  Administered 2024-05-31: 15 mL via OROMUCOSAL

## 2024-05-31 MED ORDER — DEXAMETHASONE SODIUM PHOSPHATE 10 MG/ML IJ SOLN
8.0000 mg | Freq: Once | INTRAMUSCULAR | Status: AC
  Administered 2024-05-31: 8 mg via INTRAVENOUS

## 2024-05-31 MED ORDER — DIPHENHYDRAMINE HCL 12.5 MG/5ML PO ELIX: 12.5000 mg | ORAL_SOLUTION | ORAL | Status: DC | PRN

## 2024-05-31 MED ORDER — CLONIDINE HCL (ANALGESIA) 100 MCG/ML EP SOLN
EPIDURAL | Status: DC | PRN
  Administered 2024-05-31: 60 ug

## 2024-05-31 MED ORDER — METOCLOPRAMIDE HCL 5 MG/ML IJ SOLN: 5.0000 mg | Freq: Three times a day (TID) | INTRAMUSCULAR | Status: DC | PRN

## 2024-05-31 MED ORDER — ORAL CARE MOUTH RINSE: 15.0000 mL | Freq: Once | OROMUCOSAL | Status: AC

## 2024-05-31 MED ORDER — ROCURONIUM BROMIDE 100 MG/10ML IV SOLN
INTRAVENOUS | Status: DC | PRN
  Administered 2024-05-31: 50 mg via INTRAVENOUS
  Administered 2024-05-31: 20 mg via INTRAVENOUS

## 2024-05-31 MED ORDER — SUGAMMADEX SODIUM 200 MG/2ML IV SOLN
INTRAVENOUS | Status: AC
Start: 2024-05-31 — End: 2024-05-31
  Filled 2024-05-31: qty 2

## 2024-05-31 MED ORDER — PROPOFOL 10 MG/ML IV BOLUS
INTRAVENOUS | Status: AC
  Filled 2024-05-31: qty 20

## 2024-05-31 MED ORDER — CEFAZOLIN SODIUM-DEXTROSE 2-4 GM/100ML-% IV SOLN
2.0000 g | INTRAVENOUS | Status: AC
  Administered 2024-05-31: 2 g via INTRAVENOUS
  Filled 2024-05-31: qty 100

## 2024-05-31 MED ORDER — MIDAZOLAM HCL 2 MG/2ML IJ SOLN
2.0000 mg | INTRAMUSCULAR | Status: DC
  Administered 2024-05-31 (×2): 1 mg via INTRAVENOUS

## 2024-05-31 MED ORDER — PROPOFOL 10 MG/ML IV BOLUS
INTRAVENOUS | Status: DC | PRN
  Administered 2024-05-31: 140 mg via INTRAVENOUS

## 2024-05-31 MED ORDER — TRAMADOL HCL 50 MG PO TABS
50.0000 mg | ORAL_TABLET | Freq: Four times a day (QID) | ORAL | Status: DC | PRN
  Administered 2024-06-01: 100 mg via ORAL
  Filled 2024-05-31: qty 2

## 2024-05-31 SURGICAL SUPPLY — 42 items
ATTUNE PS FEM LT SZ 5 CEM KNEE (Femur) IMPLANT
ATTUNE PSRP INSR SZ5 8 KNEE (Insert) IMPLANT
BAG COUNTER SPONGE SURGICOUNT (BAG) IMPLANT
BAG ZIPLOCK 12X15 (MISCELLANEOUS) ×1 IMPLANT
BASE TIBIAL ROT PLAT SZ 5 KNEE (Knees) IMPLANT
BLADE SAG 18X100X1.27 (BLADE) ×1 IMPLANT
BLADE SAW SGTL 11.0X1.19X90.0M (BLADE) ×1 IMPLANT
BNDG ELASTIC 6INX 5YD STR LF (GAUZE/BANDAGES/DRESSINGS) ×1 IMPLANT
BOWL SMART MIX CTS (DISPOSABLE) ×1 IMPLANT
CEMENT HV SMART SET (Cement) ×2 IMPLANT
COOLER ICEMAN CLASSIC (MISCELLANEOUS) IMPLANT
COVER SURGICAL LIGHT HANDLE (MISCELLANEOUS) ×1 IMPLANT
CUFF TRNQT CYL 34X4.125X (TOURNIQUET CUFF) ×1 IMPLANT
DERMABOND ADVANCED .7 DNX12 (GAUZE/BANDAGES/DRESSINGS) ×1 IMPLANT
DRAPE U-SHAPE 47X51 STRL (DRAPES) ×1 IMPLANT
DRSG AQUACEL AG ADV 3.5X10 (GAUZE/BANDAGES/DRESSINGS) ×1 IMPLANT
DURAPREP 26ML APPLICATOR (WOUND CARE) ×1 IMPLANT
ELECT REM PT RETURN 15FT ADLT (MISCELLANEOUS) ×1 IMPLANT
GLOVE BIO SURGEON STRL SZ 6.5 (GLOVE) IMPLANT
GLOVE BIO SURGEON STRL SZ7 (GLOVE) IMPLANT
GLOVE BIO SURGEON STRL SZ8 (GLOVE) ×1 IMPLANT
GLOVE BIOGEL PI IND STRL 7.0 (GLOVE) ×1 IMPLANT
GLOVE BIOGEL PI IND STRL 8 (GLOVE) ×1 IMPLANT
GOWN STRL REUS W/ TWL LRG LVL3 (GOWN DISPOSABLE) ×1 IMPLANT
IMMOBILIZER KNEE 20 THIGH 36 (SOFTGOODS) ×1 IMPLANT
KIT TURNOVER KIT A (KITS) ×1 IMPLANT
MANIFOLD NEPTUNE II (INSTRUMENTS) ×1 IMPLANT
NS IRRIG 1000ML POUR BTL (IV SOLUTION) ×1 IMPLANT
PACK TOTAL KNEE CUSTOM (KITS) ×1 IMPLANT
PAD COLD SHLDR WRAP-ON (PAD) IMPLANT
PADDING CAST COTTON 6X4 STRL (CAST SUPPLIES) ×2 IMPLANT
PATELLA MEDIAL ATTUN 35MM KNEE (Knees) IMPLANT
PENCIL SMOKE EVACUATOR (MISCELLANEOUS) ×1 IMPLANT
PIN STEINMAN FIXATION KNEE (PIN) IMPLANT
PROTECTOR NERVE ULNAR (MISCELLANEOUS) ×1 IMPLANT
SET HNDPC FAN SPRY TIP SCT (DISPOSABLE) ×1 IMPLANT
SUT MNCRL AB 4-0 PS2 18 (SUTURE) ×1 IMPLANT
SUT VIC AB 2-0 CT1 TAPERPNT 27 (SUTURE) ×3 IMPLANT
SUTURE STRATFX 0 PDS 27 VIOLET (SUTURE) ×1 IMPLANT
TOWEL GREEN STERILE FF (TOWEL DISPOSABLE) ×1 IMPLANT
TUBE SUCTION HIGH CAP CLEAR NV (SUCTIONS) ×1 IMPLANT
WATER STERILE IRR 1000ML POUR (IV SOLUTION) ×2 IMPLANT

## 2024-05-31 NOTE — Anesthesia Procedure Notes (Signed)
 Anesthesia Regional Block: Adductor canal block   Pre-Anesthetic Checklist: , timeout performed,  Correct Patient, Correct Site, Correct Laterality,  Correct Procedure, Correct Position, site marked,  Risks and benefits discussed,  Surgical consent,  Pre-op evaluation,  At surgeon's request and post-op pain management  Laterality: Lower and Left  Prep: chloraprep       Needles:  Injection technique: Single-shot  Needle Type: Stimiplex     Needle Length: 9cm  Needle Gauge: 21     Additional Needles:   Procedures:,,,, ultrasound used (permanent image in chart),,    Narrative:  Start time: 05/31/2024 9:32 AM End time: 05/31/2024 9:52 AM Injection made incrementally with aspirations every 5 mL.  Performed by: Personally  Anesthesiologist: Darlyn Rush, MD  Additional Notes: BP cuff, EKG monitors applied. Sedation begun. Artery and nerve location verified with ultrasound. Anesthetic injected incrementally (5ml), slowly, and after negative aspirations under direct u/s guidance. Good fascial/perineural spread. Tolerated well.

## 2024-05-31 NOTE — Progress Notes (Signed)
 Orthopedic Tech Progress Note Patient Details:  Natalie Eaton Oct 10, 1955 [id number] Applied CPM per order. Will remove at 4:20 pm.  CPM Left Knee CPM Left Knee: On Left Knee Flexion (Degrees): 40 Left Knee Extension (Degrees): 10  Post Interventions Patient Tolerated: Well Instructions Provided: Adjustment of device, Care of device, Poper ambulation with device Ortho Devices Type of Ortho Device: CPM padding Ortho Device/Splint Location: LLE Ortho Device/Splint Interventions: Ordered, Application, Adjustment   Post Interventions Patient Tolerated: Well Instructions Provided: Adjustment of device, Care of device, Poper ambulation with device  Morna Pink 05/31/2024, 12:24 PM

## 2024-05-31 NOTE — Telephone Encounter (Signed)
 LMOM for pt to return call.

## 2024-05-31 NOTE — Transfer of Care (Signed)
 Immediate Anesthesia Transfer of Care Note  Patient: Natalie Eaton  Procedure(s) Performed: ARTHROPLASTY, KNEE, TOTAL (Left: Knee)  Patient Location: PACU  Anesthesia Type:General  Level of Consciousness: awake, alert , and patient cooperative  Airway & Oxygen Therapy: Patient Spontanous Breathing and Patient connected to face mask oxygen  Post-op Assessment: Report given to RN and Post -op Vital signs reviewed and stable  Post vital signs: Reviewed and stable  Last Vitals:  Vitals Value Taken Time  BP 112/64 05/31/24 12:08  Temp    Pulse 79 05/31/24 12:10  Resp 15 05/31/24 12:10  SpO2 100 % 05/31/24 12:10  Vitals shown include unfiled device data.  Last Pain:  Vitals:   05/31/24 0854  TempSrc:   PainSc: 0-No pain         Complications: No notable events documented.

## 2024-05-31 NOTE — Evaluation (Signed)
 Physical Therapy Evaluation Patient Details Name: Natalie Eaton MRN: [id number] DOB: 18-Dec-1955 Today's Date: 05/31/2024  History of Present Illness  68 yo female, S/P LTKA on 05/31/24. PMH< TBI, leukemia, CAD, Chronic pain,  Clinical Impression   Pt admitted with above diagnosis.  Pt currently with functional limitations due to the deficits listed below (see PT Problem List). Pt will benefit from acute skilled PT to increase their independence and safety with mobility to allow discharge.     The patient initially reporting 10/10 pain , recently received IV medication. Patient gradually tolerated very well to stand and step to [facility] then step to bed Using  RW.  Patient has family to assist at DC.      If plan is discharge home, recommend the following: A little help with walking and/or transfers;A little help with bathing/dressing/bathroom;Assistance with cooking/housework;Assist for transportation;Help with stairs or ramp for entrance   Can travel by private vehicle        Equipment Recommendations None recommended by PT  Recommendations for Other Services       Functional Status Assessment Patient has had a recent decline in their functional status and demonstrates the ability to make significant improvements in function in a reasonable and predictable amount of time.     Precautions / Restrictions Precautions Precautions: Knee;Fall Required Braces or Orthoses: Knee Immobilizer - Left Knee Immobilizer - Left: Discontinue once straight leg raise with < 10 degree lag      Mobility  Bed Mobility Overal bed mobility: Needs Assistance Bed Mobility: Supine to Sit, Sit to Supine     Supine to sit: Min assist Sit to supine: Min assist   General bed mobility comments: self assist LLE    Transfers Overall transfer level: Needs assistance Equipment used: Rolling walker (2 wheels) Transfers: Sit to/from Stand, Bed to chair/wheelchair/BSC Sit to Stand: Min assist   Step pivot  transfers: Min assist       General transfer comment: cues for  hand and LLE position    Ambulation/Gait Ambulation/Gait assistance: Min assist Gait Distance (Feet): 6 Feet Assistive device: Rolling walker (2 wheels) Gait Pattern/deviations: Step-to pattern       General Gait Details: cues for sequence  and posture  Stairs            Wheelchair Mobility     Tilt Bed    Modified Rankin (Stroke Patients Only)       Balance Overall balance assessment: Needs assistance Sitting-balance support: No upper extremity supported, Feet supported Sitting balance-Leahy Scale: Good     Standing balance support: Bilateral upper extremity supported, During functional activity, Reliant on assistive device for balance Standing balance-Leahy Scale: Poor                               Pertinent Vitals/Pain Pain Assessment Pain Assessment: 0-10 Pain Score: 8  Pain Location: left knee Pain Descriptors / Indicators: Sharp Pain Intervention(s): Repositioned, Premedicated before session, Ice applied    Home Living Family/patient expects to be discharged to:: Private residence Living Arrangements: Alone Available Help at Discharge: Family;Friend(s) Type of Home: House Home Access: Stairs to enter       Home Layout: One level Home Equipment: Agricultural consultant (2 wheels);Cane - single point;Rollator (4 wheels)      Prior Function Prior Level of Function : Independent/Modified Independent  Extremity/Trunk Assessment   Upper Extremity Assessment Upper Extremity Assessment: Overall WFL for tasks assessed    Lower Extremity Assessment Lower Extremity Assessment: LLE deficits/detail LLE Deficits / Details: knee flex 10-45, Unable to SLR    Cervical / Trunk Assessment Cervical / Trunk Assessment: Normal  Communication   Communication Communication: No apparent difficulties    Cognition Arousal: Alert Behavior During Therapy: WFL  for tasks assessed/performed   PT - Cognitive impairments: No apparent impairments                         Following commands: Intact       Cueing       General Comments      Exercises Total Joint Exercises Straight Leg Raises: AAROM, Left, 10 reps   Assessment/Plan    PT Assessment Patient needs continued PT services  PT Problem List Decreased strength;Decreased range of motion;Decreased knowledge of use of DME;Decreased activity tolerance;Decreased mobility;Decreased safety awareness;Pain       PT Treatment Interventions DME instruction;Gait training;Functional mobility training;Therapeutic activities;Stair training;Patient/family education;Therapeutic exercise    PT Goals (Current goals can be found in the Care Plan section)  Acute Rehab PT Goals Patient Stated Goal: go home PT Goal Formulation: With patient Time For Goal Achievement: 06/07/24 Potential to Achieve Goals: Good    Frequency 7X/week     Co-evaluation               AM-PAC PT 6 Clicks Mobility  Outcome Measure Help needed turning from your back to your side while in a flat bed without using bedrails?: A Little Help needed moving from lying on your back to sitting on the side of a flat bed without using bedrails?: A Little Help needed moving to and from a bed to a chair (including a wheelchair)?: A Little Help needed standing up from a chair using your arms (e.g., wheelchair or bedside chair)?: A Little Help needed to walk in hospital room?: A Little Help needed climbing 3-5 steps with a railing? : A Little 6 Click Score: 18    End of Session Equipment Utilized During Treatment: Gait belt;Left knee immobilizer Activity Tolerance: Patient tolerated treatment well Patient left: in bed;with call bell/phone within reach;with bed alarm set Nurse Communication: Mobility status PT Visit Diagnosis: Difficulty in walking, not elsewhere classified (R26.2);Pain Pain - Right/Left: Left Pain  - part of body: Knee    Time: 8371-8298 PT Time Calculation (min) (ACUTE ONLY): 33 min   Charges:   PT Evaluation $PT Eval Low Complexity: 1 Low PT Treatments $Therapeutic Activity: 8-22 mins PT General Charges $$ ACUTE PT VISIT: 1 Visit          Darice Potters PT Acute Rehabilitation Services Office [phone]   Potters Darice Norris 05/31/2024, 5:14 PM

## 2024-05-31 NOTE — Interval H&P Note (Signed)
 History and Physical Interval Note:  05/31/2024 8:37 AM  Natalie Eaton  has presented today for surgery, with the diagnosis of Left knee osteoarthritis.  The various methods of treatment have been discussed with the patient and family. After consideration of risks, benefits and other options for treatment, the patient has consented to  Procedure(s): ARTHROPLASTY, KNEE, TOTAL (Left) as a surgical intervention.  The patient's history has been reviewed, patient examined, no change in status, stable for surgery.  I have reviewed the patient's chart and labs.  Questions were answered to the patient's satisfaction.     [name]

## 2024-05-31 NOTE — Progress Notes (Signed)
 Orthopedic Tech Progress Note Patient Details:  Natalie Eaton 1956/02/09 [id number]  Patient ID: [name], female   DOB: 1956/02/02, 68 y.o.   MRN: [id number] Removed CPM from pt.  Morna Pink 05/31/2024, 4:30 PM

## 2024-05-31 NOTE — Op Note (Signed)
 OPERATIVE REPORT-TOTAL KNEE ARTHROPLASTY   Pre-operative diagnosis- Osteoarthritis  Left knee(s)  Post-operative diagnosis- Osteoarthritis Left knee(s)  Procedure-  Left  Total Knee Arthroplasty  Surgeon- Natalie Eaton. Natalie Alvarenga, MD  Assistant- Natalie Mess, PA-C   Anesthesia-  Adductor canal block and spinal  EBL- 25 ml   Drains None  Tourniquet time-  Total Tourniquet Time Documented: Thigh (Left) - 34 minutes Total: Thigh (Left) - 34 minutes     Complications- None  Condition-PACU - hemodynamically stable.   Brief Clinical Note  Natalie Eaton is a 68 y.o. year old female with end stage OA of her left knee with progressively worsening pain and dysfunction. She has constant pain, with activity and at rest and significant functional deficits with difficulties even with ADLs. She has had extensive non-op management including analgesics, injections of cortisone and viscosupplements, and home exercise program, but remains in significant pain with significant dysfunction. Radiographs show bone on bone arthritis lateral and patellofemoral with valgus deformity. She presents now for left Total Knee Arthroplasty.     Procedure in detail---   The patient is brought into the operating room and positioned supine on the operating table. After successful administration of  Adductor canal block and spinal,   a tourniquet is placed high on the  Left thigh(s) and the lower extremity is prepped and draped in the usual sterile fashion. Time out is performed by the operating team and then the  Left lower extremity is wrapped in Esmarch, knee flexed and the tourniquet inflated to 300 mmHg.       A midline incision is made with a ten blade through the subcutaneous tissue to the level of the extensor mechanism. A fresh blade is used to make a medial parapatellar arthrotomy. Soft tissue over the proximal medial tibia is subperiosteally elevated to the joint line with a knife and into the  semimembranosus bursa with a Cobb elevator. Soft tissue over the proximal lateral tibia is elevated with attention being paid to avoiding the patellar tendon on the tibial tubercle. The patella is everted, knee flexed 90 degrees and the ACL and PCL are removed. Findings are bone on bone lateral and patellofemoral with large global osteophytes        The drill is used to create a starting hole in the distal femur and the canal is thoroughly irrigated with sterile saline to remove the fatty contents. The 5 degree Left  valgus alignment guide is placed into the femoral canal and the distal femoral cutting block is pinned to remove 10 mm off the distal femur. Resection is made with an oscillating saw.      The tibia is subluxed forward and the menisci are removed. The extramedullary alignment guide is placed referencing proximally at the medial aspect of the tibial tubercle and distally along the second metatarsal axis and tibial crest. The block is pinned to remove 2mm off the more deficient lateral  side. Resection is made with an oscillating saw. Size 5is the most appropriate size for the tibia and the proximal tibia is prepared with the modular drill and keel punch for that size.      The femoral sizing guide is placed and size 5 is most appropriate. Rotation is marked off the epicondylar axis and confirmed by creating a rectangular flexion gap at 90 degrees. The size 5 cutting block is pinned in this rotation and the anterior, posterior and chamfer cuts are made with the oscillating saw. The intercondylar block is then placed and  that cut is made.      Trial size 5 tibial component, trial size 5 posterior stabilized femur and a 8  mm posterior stabilized rotating platform insert trial is placed. Full extension is achieved with excellent varus/valgus and anterior/posterior balance throughout full range of motion. The patella is everted and thickness measured to be 22  mm. Free hand resection is taken to 12 mm, a  35 template is placed, lug holes are drilled, trial patella is placed, and it tracks normally. Osteophytes are removed off the posterior femur with the trial in place. All trials are removed and the cut bone surfaces prepared with pulsatile lavage. Cement is mixed and once ready for implantation, the size 5 tibial implant, size  5 posterior stabilized femoral component, and the size 35 patella are cemented in place and the patella is held with the clamp. The trial insert is placed and the knee held in full extension. The Exparel  (20 ml mixed with 60 ml saline) is injected into the extensor mechanism, posterior capsule, medial and lateral gutters and subcutaneous tissues.  All extruded cement is removed and once the cement is hard the permanent 8 mm posterior stabilized rotating platform insert is placed into the tibial tray.      The wound is copiously irrigated with saline solution and the extensor mechanism closed with # 0 Stratofix suture. The tourniquet is released for a total tourniquet time of 34  minutes. Flexion against gravity is 140 degrees and the patella tracks normally. Subcutaneous tissue is closed with 2.0 vicryl and subcuticular with running 4.0 Monocryl. The incision is cleaned and dried and steri-strips and a bulky sterile dressing are applied. The limb is placed into a knee immobilizer and the patient is awakened and transported to recovery in stable condition.      Please note that a surgical assistant was a medical necessity for this procedure in order to perform it in a safe and expeditious manner. Surgical assistant was necessary to retract the ligaments and vital neurovascular structures to prevent injury to them and also necessary for proper positioning of the limb to allow for anatomic placement of the prosthesis.   Natalie ROCKFORD Taedyn Glasscock, MD    05/31/2024, 11:38 AM

## 2024-05-31 NOTE — Anesthesia Procedure Notes (Signed)
 Procedure Name: Intubation Date/Time: 05/31/2024 10:34 AM  Performed by: Kathern Rollene LABOR, CRNAPre-anesthesia Checklist: Patient identified, Emergency Drugs available, Suction available and Patient being monitored Patient Re-evaluated:Patient Re-evaluated prior to induction Oxygen Delivery Method: Circle system utilized Preoxygenation: Pre-oxygenation with 100% oxygen Induction Type: IV induction Ventilation: Mask ventilation without difficulty Laryngoscope Size: Mac and 3 Grade View: Grade I Tube type: Oral Tube size: 7.0 mm Number of attempts: 1 Airway Equipment and Method: Stylet Placement Confirmation: ETT inserted through vocal cords under direct vision, positive ETCO2 and breath sounds checked- equal and bilateral Secured at: 22 cm Tube secured with: Tape Dental Injury: Teeth and Oropharynx as per pre-operative assessment

## 2024-05-31 NOTE — Discharge Instructions (Addendum)
 Natalie Moan, MD Total Joint Specialist [facility] [address], [address] [place], [place] [address] [phone]  TOTAL KNEE REPLACEMENT POSTOPERATIVE DIRECTIONS    Knee Rehabilitation, Guidelines Following Surgery  Results after knee surgery are often greatly improved when you follow the exercise, range of motion and muscle strengthening exercises prescribed by your doctor. Safety measures are also important to protect the knee from further injury. If any of these exercises cause you to have increased pain or swelling in your knee joint, decrease the amount until you are comfortable again and slowly increase them. If you have problems or questions, call your caregiver or physical therapist for advice.   BLOOD CLOT PREVENTION Take a 10 mg Xarelto  once a day for three weeks following surgery. Then resume taking 81 mg Aspirin once a day. You may resume your vitamins/supplements once you have discontinued the Xarelto . Do not take any NSAIDs (Advil, Aleve, Ibuprofen, Meloxicam, etc.) until you have discontinued the Xarelto .    HOME CARE INSTRUCTIONS  Remove items at home which could result in a fall. This includes throw rugs or furniture in walking pathways.  ICE to the affected knee as much as tolerated. Icing helps control swelling. If the swelling is well controlled you will be more comfortable and rehab easier. Continue to use ice on the knee for pain and swelling from surgery. You may notice swelling that will progress down to the foot and ankle. This is normal after surgery. Elevate the leg when you are not up walking on it.    Continue to use the breathing machine which will help keep your temperature down. It is common for your temperature to cycle up and down following surgery, especially at night when you are not up moving around and exerting yourself. The breathing machine keeps your lungs expanded and your temperature down. Do not place pillow under the  operative knee, focus on keeping the knee straight while resting  DIET You may resume your previous home diet once you are discharged from the hospital.  DRESSING / WOUND CARE / SHOWERING Keep your bulky bandage on for 2 days. On the third post-operative day you may remove the Ace bandage and gauze. There is a waterproof adhesive bandage on your skin which will stay in place until your first follow-up appointment. Once you remove this you will not need to place another bandage You may begin showering 3 days following surgery, but do not submerge the incision under water.  ACTIVITY For the first 5 days, the key is rest and control of pain and swelling Do your home exercises twice a day starting on post-operative day 3. On the days you go to physical therapy, just do the home exercises once that day. You should rest, ice and elevate the leg for 50 minutes out of every hour. Get up and walk/stretch for 10 minutes per hour. After 5 days you can increase your activity slowly as tolerated. Walk with your walker as instructed. Use the walker until you are comfortable transitioning to a cane. Walk with the cane in the opposite hand of the operative leg. You may discontinue the cane once you are comfortable and walking steadily. Avoid periods of inactivity such as sitting longer than an hour when not asleep. This helps prevent blood clots.  You may discontinue the knee immobilizer once you are able to perform a straight leg raise while lying down. You may resume a sexual relationship in one month or when given the OK by  your doctor.  You may return to work once you are cleared by your doctor.  Do not drive a car for 6 weeks or until released by your surgeon.  Do not drive while taking narcotics.  TED HOSE STOCKINGS Wear the elastic stockings on both legs for three weeks following surgery during the day. You may remove them at night for sleeping.  WEIGHT BEARING Weight bearing as tolerated with assist  device (walker, cane, etc) as directed, use it as long as suggested by your surgeon or therapist, typically at least 4-6 weeks.  POSTOPERATIVE CONSTIPATION PROTOCOL Constipation - defined medically as fewer than three stools per week and severe constipation as less than one stool per week.  One of the most common issues patients have following surgery is constipation.  Even if you have a regular bowel pattern at home, your normal regimen is likely to be disrupted due to multiple reasons following surgery.  Combination of anesthesia, postoperative narcotics, change in appetite and fluid intake all can affect your bowels.  In order to avoid complications following surgery, here are some recommendations in order to help you during your recovery period.  Colace (docusate) - Pick up an over-the-counter form of Colace or another stool softener and take twice a day as long as you are requiring postoperative pain medications.  Take with a full glass of water daily.  If you experience loose stools or diarrhea, hold the colace until you stool forms back up. If your symptoms do not get better within 1 week or if they get worse, check with your doctor. Dulcolax (bisacodyl ) - Pick up over-the-counter and take as directed by the product packaging as needed to assist with the movement of your bowels.  Take with a full glass of water.  Use this product as needed if not relieved by Colace only.  MiraLax  (polyethylene glycol) - Pick up over-the-counter to have on hand. MiraLax  is a solution that will increase the amount of water in your bowels to assist with bowel movements.  Take as directed and can mix with a glass of water, juice, soda, coffee, or tea. Take if you go more than two days without a movement. Do not use MiraLax  more than once per day. Call your doctor if you are still constipated or irregular after using this medication for 7 days in a row.  If you continue to have problems with postoperative constipation,  please contact the office for further assistance and recommendations.  If you experience the worst abdominal pain ever or develop nausea or vomiting, please contact the office immediatly for further recommendations for treatment.  ITCHING If you experience itching with your medications, try taking only a single pain pill, or even half a pain pill at a time.  You can also use Benadryl  over the counter for itching or also to help with sleep.   MEDICATIONS See your medication summary on the "After Visit Summary" that the nursing staff will review with you prior to discharge.  You may have some home medications which will be placed on hold until you complete the course of blood thinner medication.  It is important for you to complete the blood thinner medication as prescribed by your surgeon.  Continue your approved medications as instructed at time of discharge.  PRECAUTIONS If you experience chest pain or shortness of breath - call 911 immediately for transfer to the hospital emergency department.  If you develop a fever greater that 101 F, purulent drainage from wound, increased redness  or drainage from wound, foul odor from the wound/dressing, or calf pain - CONTACT YOUR SURGEON.                                                   FOLLOW-UP APPOINTMENTS Make sure you keep all of your appointments after your operation with your surgeon and caregivers. You should call the office at the above phone number and make an appointment for approximately two weeks after the date of your surgery or on the date instructed by your surgeon outlined in the After Visit Summary.  RANGE OF MOTION AND STRENGTHENING EXERCISES  Rehabilitation of the knee is important following a knee injury or an operation. After just a few days of immobilization, the muscles of the thigh which control the knee become weakened and shrink (atrophy). Knee exercises are designed to build up the tone and strength of the thigh muscles and to  improve knee motion. Often times heat used for twenty to thirty minutes before working out will loosen up your tissues and help with improving the range of motion but do not use heat for the first two weeks following surgery. These exercises can be done on a training (exercise) mat, on the floor, on a table or on a bed. Use what ever works the best and is most comfortable for you Knee exercises include:  Leg Lifts - While your knee is still immobilized in a splint or cast, you can do straight leg raises. Lift the leg to 60 degrees, hold for 3 sec, and slowly lower the leg. Repeat 10-20 times 2-3 times daily. Perform this exercise against resistance later as your knee gets better.  Quad and Hamstring Sets - Tighten up the muscle on the front of the thigh (Quad) and hold for 5-10 sec. Repeat this 10-20 times hourly. Hamstring sets are done by pushing the foot backward against an object and holding for 5-10 sec. Repeat as with quad sets.  Leg Slides: Lying on your back, slowly slide your foot toward your buttocks, bending your knee up off the floor (only go as far as is comfortable). Then slowly slide your foot back down until your leg is flat on the floor again. Angel Wings: Lying on your back spread your legs to the side as far apart as you can without causing discomfort.  A rehabilitation program following serious knee injuries can speed recovery and prevent re-injury in the future due to weakened muscles. Contact your doctor or a physical therapist for more information on knee rehabilitation.   POST-OPERATIVE OPIOID TAPER INSTRUCTIONS: It is important to wean off of your opioid medication as soon as possible. If you do not need pain medication after your surgery it is ok to stop day one. Opioids include: Codeine, Hydrocodone(Norco, Vicodin), Oxycodone(Percocet, oxycontin) and hydromorphone  amongst others.  Long term and even short term use of opiods can cause: Increased pain  response Dependence Constipation Depression Respiratory depression And more.  Withdrawal symptoms can include Flu like symptoms Nausea, vomiting And more Techniques to manage these symptoms Hydrate well Eat regular healthy meals Stay active Use relaxation techniques(deep breathing, meditating, yoga) Do Not substitute Alcohol to help with tapering If you have been on opioids for less than two weeks and do not have pain than it is ok to stop all together.  Plan to wean off of opioids This  plan should start within one week post op of your joint replacement. Maintain the same interval or time between taking each dose and first decrease the dose.  Cut the total daily intake of opioids by one tablet each day Next start to increase the time between doses. The last dose that should be eliminated is the evening dose.   IF YOU ARE TRANSFERRED TO A SKILLED REHAB FACILITY If the patient is transferred to a skilled rehab facility following release from the hospital, a list of the current medications will be sent to the facility for the patient to continue.  When discharged from the skilled rehab facility, please have the facility set up the patient's Home Health Physical Therapy prior to being released. Also, the skilled facility will be responsible for providing the patient with their medications at time of release from the facility to include their pain medication, the muscle relaxants, and their blood thinner medication. If the patient is still at the rehab facility at time of the two week follow up appointment, the skilled rehab facility will also need to assist the patient in arranging follow up appointment in our office and any transportation needs.  MAKE SURE YOU:  Understand these instructions.  Get help right away if you are not doing well or get worse.   DENTAL ANTIBIOTICS:  In most cases prophylactic antibiotics for Dental procdeures after total joint surgery are not  necessary.  Exceptions are as follows:  1. History of prior total joint infection  2. Severely immunocompromised (Organ Transplant, cancer chemotherapy, Rheumatoid biologic meds such as Humera)  3. Poorly controlled diabetes (A1C &gt; 8.0, blood glucose over 200)  If you have one of these conditions, contact your surgeon for an antibiotic prescription, prior to your dental procedure.   Information on my medicine - XARELTO  (Rivaroxaban )   Why was Xarelto  prescribed for you? Xarelto  was prescribed for you to reduce the risk of blood clots forming after orthopedic surgery. The medical term for these abnormal blood clots is venous thromboembolism (VTE).  What do you need to know about xarelto  ? Take your Xarelto  ONCE DAILY at the same time every day. You may take it either with or without food.  If you have difficulty swallowing the tablet whole, you may crush it and mix in applesauce just prior to taking your dose.  Take Xarelto  exactly as prescribed by your doctor and DO NOT stop taking Xarelto  without talking to the doctor who prescribed the medication.  Stopping without other VTE prevention medication to take the place of Xarelto  may increase your risk of developing a clot.  After discharge, you should have regular check-up appointments with your healthcare provider that is prescribing your Xarelto .    What do you do if you miss a dose? If you miss a dose, take it as soon as you remember on the same day then continue your regularly scheduled once daily regimen the next day. Do not take two doses of Xarelto  on the same day.   Important Safety Information A possible side effect of Xarelto  is bleeding. You should call your healthcare provider right away if you experience any of the following: Bleeding from an injury or your nose that does not stop. Unusual colored urine (red or dark brown) or unusual colored stools (red or black). Unusual bruising for unknown reasons. A  serious fall or if you hit your head (even if there is no bleeding).  Some medicines may interact with Xarelto  and might increase your risk  of bleeding while on Xarelto . To help avoid this, consult your healthcare provider or pharmacist prior to using any new prescription or non-prescription medications, including herbals, vitamins, non-steroidal anti-inflammatory drugs (NSAIDs) and supplements.  This website has more information on Xarelto : www.xarelto .com.    Pick up stool softner and laxative for home use following surgery while on pain medications. Do not submerge incision under water. Please use good hand washing techniques while changing dressing each day. May shower starting three days after surgery. Please use a clean towel to pat the incision dry following showers. Continue to use ice for pain and swelling after surgery. Do not use any lotions or creams on the incision until instructed by your surgeon.

## 2024-05-31 NOTE — Care Plan (Signed)
 Ortho Bundle Case Management Note  Patient Details  Name: Natalie Eaton MRN: [id number] Date of Birth: 08-Oct-1955  LT TKA on 05/31/24  DCP: Home with sister and daughter DME: RW, ordered through [employer]; CTU through [employer] PT: [facility]                   DME Arranged:  Vannie rolling DME Agency:  [employer]  HH Arranged:    HH Agency:     Additional Comments: Please contact me with any questions of if this plan should need to change.  Burnard Dross, Case Manager [facility] [phone]  Ext. [phone]   05/31/2024, 8:54 AM

## 2024-05-31 NOTE — Anesthesia Postprocedure Evaluation (Signed)
 Anesthesia Post Note  Patient: Natalie Eaton  Procedure(s) Performed: ARTHROPLASTY, KNEE, TOTAL (Left: Knee)     Patient location during evaluation: PACU Anesthesia Type: General Level of consciousness: sedated and patient cooperative Pain management: pain level controlled Vital Signs Assessment: post-procedure vital signs reviewed and stable Respiratory status: spontaneous breathing Cardiovascular status: stable Anesthetic complications: no   No notable events documented.  Last Vitals:  Vitals:   05/31/24 1331 05/31/24 1524  BP: 132/70 128/81  Pulse: (!) 58 66  Resp: 18 15  Temp: 36.4 C 36.6 C  SpO2: 94% 97%    Last Pain:  Vitals:   05/31/24 2024  TempSrc:   PainSc: 10-Worst pain ever                 Norleen Pope

## 2024-06-01 ENCOUNTER — Other Ambulatory Visit (HOSPITAL_COMMUNITY): Payer: Self-pay

## 2024-06-01 ENCOUNTER — Encounter (HOSPITAL_COMMUNITY): Payer: Self-pay | Admitting: Orthopedic Surgery

## 2024-06-01 DIAGNOSIS — M1712 Unilateral primary osteoarthritis, left knee: Secondary | ICD-10-CM | POA: Diagnosis not present

## 2024-06-01 LAB — CBC
HCT: 40.2 % (ref 36.0–46.0)
Hemoglobin: 12 g/dL (ref 12.0–15.0)
MCH: 29.3 pg (ref 26.0–34.0)
MCHC: 29.9 g/dL — ABNORMAL LOW (ref 30.0–36.0)
MCV: 98.3 fL (ref 80.0–100.0)
Platelets: 190 K/uL (ref 150–400)
RBC: 4.09 MIL/uL (ref 3.87–5.11)
RDW: 13.3 % (ref 11.5–15.5)
WBC: 15.2 K/uL — ABNORMAL HIGH (ref 4.0–10.5)
nRBC: 0 % (ref 0.0–0.2)

## 2024-06-01 LAB — BASIC METABOLIC PANEL WITH GFR
Anion gap: 11 (ref 5–15)
BUN: 11 mg/dL (ref 8–23)
CO2: 23 mmol/L (ref 22–32)
Calcium: 9.6 mg/dL (ref 8.9–10.3)
Chloride: 105 mmol/L (ref 98–111)
Creatinine, Ser: 0.76 mg/dL (ref 0.44–1.00)
GFR, Estimated: >60 mL/min (ref >60)
Glucose, Bld: 134 mg/dL — ABNORMAL HIGH (ref 70–99)
Potassium: 4.8 mmol/L (ref 3.5–5.1)
Sodium: 139 mmol/L (ref 135–145)

## 2024-06-01 MED ORDER — ONDANSETRON HCL 4 MG PO TABS
4.0000 mg | ORAL_TABLET | Freq: Four times a day (QID) | ORAL | 0 refills | Status: DC | PRN
  Filled 2024-06-01: qty 20, 5d supply, fill #0

## 2024-06-01 MED ORDER — HYDROMORPHONE HCL 2 MG PO TABS
2.0000 mg | ORAL_TABLET | Freq: Four times a day (QID) | ORAL | 0 refills | Status: DC | PRN
  Filled 2024-06-01: qty 42, 6d supply, fill #0

## 2024-06-01 MED ORDER — RIVAROXABAN 10 MG PO TABS
10.0000 mg | ORAL_TABLET | Freq: Every day | ORAL | 0 refills | Status: AC
  Filled 2024-06-01: qty 20, 20d supply, fill #0

## 2024-06-01 MED ORDER — METHOCARBAMOL 500 MG PO TABS
500.0000 mg | ORAL_TABLET | Freq: Four times a day (QID) | ORAL | 0 refills | Status: DC | PRN
  Filled 2024-06-01: qty 40, 10d supply, fill #0

## 2024-06-01 MED ORDER — TRAMADOL HCL 50 MG PO TABS
50.0000 mg | ORAL_TABLET | Freq: Four times a day (QID) | ORAL | 0 refills | Status: AC | PRN
  Filled 2024-06-01: qty 40, 5d supply, fill #0

## 2024-06-01 NOTE — Progress Notes (Addendum)
 Physical Therapy Treatment Patient Details Name: Natalie Eaton MRN: [id number] DOB: 1955-12-09 Today's Date: 06/01/2024   History of Present Illness 68 yo female, S/P LTKA on 05/31/24. PMH< TBI, leukemia, CAD, Chronic pain,    PT Comments  The patient is progressing well. Has met PT goals for DC home with family support.     If plan is discharge home, recommend the following: A little help with walking and/or transfers;A little help with bathing/dressing/bathroom;Assistance with cooking/housework;Assist for transportation;Help with stairs or ramp for entrance   Can travel by private vehicle        Equipment Recommendations  None recommended by PT    Recommendations for Other Services       Precautions / Restrictions Precautions Precautions: Knee;Fall Required Braces or Orthoses: Knee Immobilizer - Left Knee Immobilizer - Left: Discontinue once straight leg raise with < 10 degree lag Restrictions Weight Bearing Restrictions Per Provider Order: No     Mobility  Bed Mobility       General bed mobility comments: in BR    Transfers Overall transfer level: Needs assistance Equipment used: Rolling walker (2 wheels) Transfers: Sit to/from Stand, Bed to chair/wheelchair/BSC Sit to Stand: Supervision, From elevated surface           General transfer comment: patient able to stand from [facility]    Ambulation/Gait Ambulation/Gait assistance: Contact guard assist Gait Distance (Feet): 20 Feet Assistive device: Rolling walker (2 wheels) Gait Pattern/deviations: Step-to pattern, Step-through pattern, Antalgic Gait velocity: decr     General Gait Details: cues for sequence  and posture   Stairs Stairs: Yes Stairs assistance: Min assist Stair Management: No rails, Step to pattern, Backwards, Forwards, With walker Number of Stairs: 1 General stair comments: patient able to stand and manage briefs and pants when stnading   Wheelchair Mobility     Tilt Bed     Modified Rankin (Stroke Patients Only)       Balance Overall balance assessment: Mild deficits observed, not formally tested Sitting-balance support: No upper extremity supported, Feet supported Sitting balance-Leahy Scale: Good                                      Communication Communication Communication: No apparent difficulties  Cognition Arousal: Alert Behavior During Therapy: Anxious   PT - Cognitive impairments: No apparent impairments                         Following commands: Intact      Cueing    Exercises   General Comments        Pertinent Vitals/Pain Pain Assessment Pain Score: 8  Pain Location: left knee Pain Descriptors / Indicators: Sharp Pain Intervention(s): Monitored during session, Premedicated before session, Ice applied    Home Living                          Prior Function            PT Goals (current goals can now be found in the care plan section) Progress towards PT goals: Progressing toward goals    Frequency    7X/week      PT Plan      Co-evaluation              AM-PAC PT 6 Clicks Mobility   Outcome Measure  Help needed turning  from your back to your side while in a flat bed without using bedrails?: A Little Help needed moving from lying on your back to sitting on the side of a flat bed without using bedrails?: A Little Help needed moving to and from a bed to a chair (including a wheelchair)?: A Little Help needed standing up from a chair using your arms (e.g., wheelchair or bedside chair)?: A Little Help needed to walk in hospital room?: A Little Help needed climbing 3-5 steps with a railing? : A Little 6 Click Score: 18    End of Session Equipment Utilized During Treatment: Gait belt;Left knee immobilizer Activity Tolerance: Patient tolerated treatment well Patient left: in chair;with call bell/phone within reach;with chair alarm set Nurse Communication: Mobility  status PT Visit Diagnosis: Difficulty in walking, not elsewhere classified (R26.2);Pain Pain - Right/Left: Left Pain - part of body: Knee     Time: 8944-8870 PT Time Calculation (min) (ACUTE ONLY): 34 min  Charges:    $Gait Training: 8-22 mins   PT General Charges $$ ACUTE PT VISIT: 1 Visit                     Natalie Eaton PT Acute Rehabilitation Services Office [phone]    Natalie Eaton 06/01/2024, 1:08 PM

## 2024-06-01 NOTE — Care Management Obs Status (Signed)
 MEDICARE OBSERVATION STATUS NOTIFICATION   Patient Details  Name: Natalie Eaton MRN: [id number] Date of Birth: 03-Oct-1955   Medicare Observation Status Notification Given:  [name], LCSW 06/01/2024, 11:34 AM

## 2024-06-01 NOTE — Progress Notes (Signed)
 Discharge meds in  a secure bag delivered to pt by this RN

## 2024-06-01 NOTE — TOC Transition Note (Signed)
 Transition of Care [facility]) - Discharge Note   Patient Details  Name: Natalie Eaton MRN: [id number] Date of Birth: Dec 21, 1955  [facility] CM/SW Contact:  NORMAN ASPEN, LCSW Phone Number: 06/01/2024, 10:42 AM   Clinical Narrative:    Met with pt and confirming she has received RW to room via [employer].  OPPT already arranged with [facility]).  No further TOC needs.   Final next level of care: OP Rehab Barriers to Discharge: No Barriers Identified   Patient Goals and CMS Choice Patient states their goals for this hospitalization and ongoing recovery are:: return home          Discharge Placement                       Discharge Plan and Services Additional resources added to the After Visit Summary for                  DME Arranged: Walker rolling DME Agency: [employer]                  Social Drivers of Health (SDOH) Interventions SDOH Screenings   Food Insecurity: No Food Insecurity (05/31/2024)  Housing: Low Risk  (05/31/2024)  Transportation Needs: No Transportation Needs (05/31/2024)  Utilities: Not At Risk (05/31/2024)  Alcohol Screen: Low Risk  (02/25/2023)  Depression (PHQ2-9): Low Risk  (01/19/2024)  Financial Resource Strain: Medium Risk (11/03/2023)  Physical Activity: Inactive (11/03/2023)  Social Connections: Unknown (05/31/2024)  Stress: No Stress Concern Present (11/03/2023)  Tobacco Use: Medium Risk (05/31/2024)  Health Literacy: Unknown (06/24/2022)   Received from [facility]     Readmission Risk Interventions     No data to display

## 2024-06-01 NOTE — Progress Notes (Signed)
 Subjective: 1 Day Post-Op Procedure(s) (LRB): ARTHROPLASTY, KNEE, TOTAL (Left) Patient reports pain as moderate.   Patient seen in rounds by Dr. Melodi. Patient had issues with pain control yesterday, improved this morning with dilaudid . Denies chest pain, SOB, or calf pain. Voiding without difficulty We will continue therapy today  Objective: Vital signs in last 24 hours: Temp:  [97 F (36.1 C)-98.8 F (37.1 C)] 98 F (36.7 C) (09/16 0450) Pulse Rate:  [57-84] 66 (09/16 0450) Resp:  [10-23] 18 (09/16 0450) BP: (111-142)/(64-92) 124/72 (09/16 0450) SpO2:  [88 %-100 %] 97 % (09/16 0450) Weight:  [89.7 kg] 89.7 kg (09/15 1345)  Intake/Output from previous day:  Intake/Output Summary (Last 24 hours) at 06/01/2024 0810 Last data filed at 06/01/2024 0646 Gross per 24 hour  Intake 3333.07 ml  Output 1500 ml  Net 1833.07 ml     Intake/Output this shift: No intake/output data recorded.  Labs: Recent Labs    06/01/24 0553  HGB 12.0   Recent Labs    06/01/24 0553  WBC 15.2*  RBC 4.09  HCT 40.2  PLT 190   Recent Labs    06/01/24 0336  NA 139  K 4.8  CL 105  CO2 23  BUN 11  CREATININE 0.76  GLUCOSE 134*  CALCIUM  9.6   No results for input(s): LABPT, INR in the last 72 hours.  Exam: General - Patient is Alert and Oriented Extremity - Neurologically intact Neurovascular intact Sensation intact distally Dorsiflexion/Plantar flexion intact Dressing - dressing C/D/I Motor Function - intact, moving foot and toes well on exam.   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   Weight as of this encounter: 89.7 kg. Advance diet Up with therapy D/C IV fluids   Patient's anticipated LOS is less than 2 midnights, meeting these requirements: - Lives within 1 hour of care - Has a competent adult at home to recover with post-op recover - NO history of  - Chronic pain requiring opioids  - Coronary Artery Disease  - Heart failure  - Heart attack  - Stroke  - DVT/VTE  - Cardiac arrhythmia  - Respiratory Failure/COPD  - Renal failure  - Anemia  - Advanced Liver disease  DVT Prophylaxis - Xarelto  Weight bearing as tolerated. Continue therapy.  Plan is to go Home after hospital stay. Plan for discharge later today if progresses with therapy and meeting goals. Scheduled  for OPPT at [facility]). Follow-up in the office in 2 weeks.  The PDMP database was reviewed today prior to any opioid medications being prescribed to this patient.  Roxie Mess, PA-C Orthopedic Surgery [phone] 06/01/2024, 8:10 AM

## 2024-06-01 NOTE — Progress Notes (Signed)
 Physical Therapy Treatment Patient Details Name: Natalie Eaton MRN: [id number] DOB: 07-Nov-1955 Today's Date: 06/01/2024   History of Present Illness 68 yo female, S/P LTKA on 05/31/24. PMH< TBI, leukemia, CAD, Chronic pain,    PT Comments  The patient 's family present for exercise  and step instructions using Rw.  Patient should progress to Dc after next walk.     If plan is discharge home, recommend the following: A little help with walking and/or transfers;A little help with bathing/dressing/bathroom;Assistance with cooking/housework;Assist for transportation;Help with stairs or ramp for entrance   Can travel by private vehicle        Equipment Recommendations  None recommended by PT    Recommendations for Other Services       Precautions / Restrictions Precautions Precautions: Knee;Fall Required Braces or Orthoses: Knee Immobilizer - Left Knee Immobilizer - Left: Discontinue once straight leg raise with < 10 degree lag Restrictions Weight Bearing Restrictions Per Provider Order: No     Mobility  Bed Mobility   Bed Mobility: Supine to Sit     Supine to sit: Supervision     General bed mobility comments: self assisted  LLE with belt    Transfers Overall transfer level: Needs assistance Equipment used: Rolling walker (2 wheels) Transfers: Sit to/from Stand, Bed to chair/wheelchair/BSC Sit to Stand: Supervision, From elevated surface           General transfer comment: patient able to stand from raised bed    Ambulation/Gait Ambulation/Gait assistance: Contact guard assist Gait Distance (Feet): 50 Feet Assistive device: Rolling walker (2 wheels) Gait Pattern/deviations: Step-to pattern, Step-through pattern, Antalgic Gait velocity: decr     General Gait Details: cues for sequence  and posture   Stairs Stairs: Yes Stairs assistance: Min assist Stair Management: No rails, Step to pattern, Backwards, Forwards, With walker Number of Stairs: 1 General  stair comments: sister present, instructed in  forwards and backwards on 1 step with Rw.   Wheelchair Mobility     Tilt Bed    Modified Rankin (Stroke Patients Only)       Balance Overall balance assessment: Mild deficits observed, not formally tested                                          Communication Communication Communication: No apparent difficulties  Cognition Arousal: Alert Behavior During Therapy: Anxious   PT - Cognitive impairments: No apparent impairments                         Following commands: Intact      Cueing    Exercises Total Joint Exercises Ankle Circles/Pumps: AROM, Both, 10 reps Quad Sets: AROM, Both, 5 reps Heel Slides: AAROM, Left, 5 reps Hip ABduction/ADduction: AAROM, Left, 5 reps Goniometric ROM: 10-40 left knee flex    General Comments        Pertinent Vitals/Pain Pain Assessment Pain Score: 8  Pain Location: left knee Pain Descriptors / Indicators: Sharp Pain Intervention(s): Monitored during session, Premedicated before session, Repositioned    Home Living                          Prior Function            PT Goals (current goals can now be found in the care plan section) Progress towards  PT goals: Progressing toward goals    Frequency    7X/week      PT Plan      Co-evaluation              AM-PAC PT 6 Clicks Mobility   Outcome Measure  Help needed turning from your back to your side while in a flat bed without using bedrails?: A Little Help needed moving from lying on your back to sitting on the side of a flat bed without using bedrails?: A Little Help needed moving to and from a bed to a chair (including a wheelchair)?: A Little Help needed standing up from a chair using your arms (e.g., wheelchair or bedside chair)?: A Little Help needed to walk in hospital room?: A Little Help needed climbing 3-5 steps with a railing? : A Little 6 Click Score: 18     End of Session Equipment Utilized During Treatment: Gait belt;Left knee immobilizer Activity Tolerance: Patient tolerated treatment well Patient left:  (in BR) Nurse Communication: Mobility status PT Visit Diagnosis: Difficulty in walking, not elsewhere classified (R26.2);Pain Pain - Right/Left: Left Pain - part of body: Knee     Time: 1020-1050 PT Time Calculation (min) (ACUTE ONLY): 30 min  Charges:    $Gait Training: 8-22 mins $Therapeutic Exercise: 8-22 mins PT General Charges $$ ACUTE PT VISIT: 1 Visit                     Darice Potters PT Acute Rehabilitation Services Office [phone]    Potters Darice Norris 06/01/2024, 1:02 PM

## 2024-06-07 NOTE — Discharge Summary (Signed)
 Patient ID: Natalie Eaton MRN: [id number] DOB/AGE: Sep 15, 1956 68 y.o.  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 female who was admitted 05/31/2024 for operative treatment ofOA (osteoarthritis) of knee. Patient has severe unremitting pain that affects sleep, daily activities, and work/hobbies. After pre-op clearance the patient was taken to the operating room on 05/31/2024 and underwent  Procedure(s): ARTHROPLASTY, KNEE, TOTAL.    Patient was given perioperative antibiotics:  Anti-infectives (From admission, onward)    Start     Dose/Rate Route Frequency Ordered Stop   05/31/24 1630  ceFAZolin  (ANCEF ) IVPB 2g/100 mL premix        2 g 200 mL/hr over 30 Minutes Intravenous Every 6 hours 05/31/24 1332 06/01/24 1029   05/31/24 0830  ceFAZolin  (ANCEF ) IVPB 2g/100 mL premix        2 g 200 mL/hr over 30 Minutes Intravenous On call to O.R. 05/31/24 0816 05/31/24 1044        Patient was given sequential compression devices, early ambulation, and  chemoprophylaxis to prevent DVT.  Patient benefited maximally from hospital stay and there were no complications.    Recent vital signs: No data found.   Recent laboratory studies: No results for input(s): WBC, HGB, HCT, PLT, NA, K, CL, CO2, BUN, CREATININE, GLUCOSE, INR, CALCIUM  in the last 72 hours.  Invalid input(s): PT, 2   Discharge Medications:   Allergies as of 06/01/2024       Reactions   Codeine Other (See Comments)   Tingling on her scalp. - When taken in large amounts        Medication List     STOP taking these medications    Airsupra  90-80 MCG/ACT Aero Generic drug: Albuterol -Budesonide   aspirin 81 MG chewable tablet   celecoxib  200 MG capsule Commonly known as: CELEBREX    MULTIVITAMIN ADULT PO   RABEprazole  20 MG tablet Commonly known as: ACIPHEX    valACYclovir  500 MG tablet Commonly known as: VALTREX        TAKE these medications    cephALEXin 500 MG capsule Commonly known as: KEFLEX Take 500 mg by mouth 3 (three) times daily.   gabapentin  300 MG capsule Commonly known as: NEURONTIN  TAKE 1 CAPSULE BY MOUTH THREE TIMES A DAY What changed: See the new instructions.   hydrochlorothiazide  25 MG tablet Commonly known as: HYDRODIURIL  Take 1 tablet (25 mg total) by  mouth daily.   HYDROmorphone  2 MG tablet Commonly known as: DILAUDID  Take 1-2 tablets (2-4 mg total) by mouth every 6 (six) hours as needed for severe pain (pain score 7-10).   levothyroxine  75 MCG tablet Commonly known as: SYNTHROID  TAKE 1 TABLET BY MOUTH EVERY DAY   methocarbamol  500 MG tablet Commonly known as: ROBAXIN  Take 1 tablet (500 mg total) by mouth every 6 (six) hours as needed for muscle spasms. What changed:  medication strength how much to take when to take this   ondansetron  4 MG tablet Commonly known as: ZOFRAN  Take 1 tablet (4 mg total) by mouth every 6 (six) hours as needed for nausea.   potassium chloride  10 MEQ CR  capsule Commonly known as: MICRO-K  TAKE 1 CAPSULE BY MOUTH 2 TIMES DAILY.   rosuvastatin  10 MG tablet Commonly known as: CRESTOR  Take 1 tablet (10 mg total) by mouth daily.   traMADol  50 MG tablet Commonly known as: ULTRAM  Take 1-2 tablets (50-100 mg total) by mouth every 6 (six) hours as needed for moderate pain (pain score 4-6). What changed: when to take this   traZODone  100 MG tablet Commonly known as: DESYREL  Take 1 tablet (100 mg total) by mouth at bedtime. What changed:  when to take this reasons to take this   Xarelto  10 MG Tabs tablet Generic drug: rivaroxaban  Take 1 tablet (10 mg total) by mouth daily with breakfast for 20 days. Then resume one 81 mg aspirin once a day               Discharge Care Instructions  (From admission, onward)           Start     Ordered   06/01/24 0000  Weight bearing as tolerated        06/01/24 0812   06/01/24 0000  Change dressing       Comments: You may remove the bulky bandage (ACE wrap and gauze) two days after surgery. You will have an adhesive waterproof bandage underneath. Leave this in place until your first follow-up appointment.   06/01/24 9187            Diagnostic Studies: No results found.  Disposition: Discharge disposition: 01-Home or Self Care       Discharge Instructions     Call MD / Call 911   Complete by: As directed    If you experience chest pain or shortness of breath, CALL 911 and be transported to the hospital emergency room.  If you develope a fever above 101 F, pus (white drainage) or increased drainage or redness at the wound, or calf pain, call your surgeon's office.   Change dressing   Complete by: As directed    You may remove the bulky bandage (ACE wrap and gauze) two days after surgery. You will have an adhesive waterproof bandage underneath. Leave this in place until your first follow-up appointment.   Constipation Prevention   Complete by: As directed    Drink plenty of  fluids.  Prune juice may be helpful.  You may use a stool softener, such as Colace (over the counter) 100 mg twice a day.  Use MiraLax  (over the counter) for constipation as needed.   Diet - low sodium heart healthy   Complete by: As directed    Do not put a pillow under the knee. Place it under the heel.   Complete by: As directed    Driving restrictions   Complete by: As directed  No driving for two weeks   Post-operative opioid taper instructions:   Complete by: As directed    POST-OPERATIVE OPIOID TAPER INSTRUCTIONS: It is important to wean off of your opioid medication as soon as possible. If you do not need pain medication after your surgery it is ok to stop day one. Opioids include: Codeine, Hydrocodone(Norco, Vicodin), Oxycodone(Percocet, oxycontin) and hydromorphone  amongst others.  Long term and even short term use of opiods can cause: Increased pain response Dependence Constipation Depression Respiratory depression And more.  Withdrawal symptoms can include Flu like symptoms Nausea, vomiting And more Techniques to manage these symptoms Hydrate well Eat regular healthy meals Stay active Use relaxation techniques(deep breathing, meditating, yoga) Do Not substitute Alcohol to help with tapering If you have been on opioids for less than two weeks and do not have pain than it is ok to stop all together.  Plan to wean off of opioids This plan should start within one week post op of your joint replacement. Maintain the same interval or time between taking each dose and first decrease the dose.  Cut the total daily intake of opioids by one tablet each day Next start to increase the time between doses. The last dose that should be eliminated is the evening dose.      TED hose   Complete by: As directed    Use stockings (TED hose) for three weeks on both leg(s).  You may remove them at night for sleeping.   Weight bearing as tolerated   Complete by: As directed          Follow-up Information     Melodi Lerner, MD. Go on 06/15/2024.   Specialty: Orthopedic Surgery Why: You are scheduled for a post op appointment on Tuesday 06/15/24 at 1:00pm Contact information: [address] [place] [address] [phone]                  Signed: Roxie Mess 06/07/2024, 10:42 AM

## 2024-06-28 ENCOUNTER — Inpatient Hospital Stay: Attending: Oncology

## 2024-06-28 ENCOUNTER — Ambulatory Visit: Payer: Self-pay | Admitting: Oncology

## 2024-06-28 ENCOUNTER — Other Ambulatory Visit: Payer: Self-pay

## 2024-06-28 ENCOUNTER — Telehealth: Payer: Self-pay | Admitting: *Deleted

## 2024-06-28 DIAGNOSIS — C9110 Chronic lymphocytic leukemia of B-cell type not having achieved remission: Secondary | ICD-10-CM

## 2024-06-28 DIAGNOSIS — R509 Fever, unspecified: Secondary | ICD-10-CM | POA: Insufficient documentation

## 2024-06-28 DIAGNOSIS — Z87891 Personal history of nicotine dependence: Secondary | ICD-10-CM | POA: Insufficient documentation

## 2024-06-28 DIAGNOSIS — R600 Localized edema: Secondary | ICD-10-CM | POA: Diagnosis not present

## 2024-06-28 DIAGNOSIS — Z87820 Personal history of traumatic brain injury: Secondary | ICD-10-CM | POA: Insufficient documentation

## 2024-06-28 LAB — CBC WITH DIFFERENTIAL (CANCER CENTER ONLY)
Abs Immature Granulocytes: 0.03 K/uL (ref 0.00–0.07)
Basophils Absolute: 0 K/uL (ref 0.0–0.1)
Basophils Relative: 1 %
Eosinophils Absolute: 0.3 K/uL (ref 0.0–0.5)
Eosinophils Relative: 3 %
HCT: 37.6 % (ref 36.0–46.0)
Hemoglobin: 12 g/dL (ref 12.0–15.0)
Immature Granulocytes: 0 %
Lymphocytes Relative: 46 %
Lymphs Abs: 4 K/uL (ref 0.7–4.0)
MCH: 30.3 pg (ref 26.0–34.0)
MCHC: 31.9 g/dL (ref 30.0–36.0)
MCV: 94.9 fL (ref 80.0–100.0)
Monocytes Absolute: 0.5 K/uL (ref 0.1–1.0)
Monocytes Relative: 6 %
Neutro Abs: 3.8 K/uL (ref 1.7–7.7)
Neutrophils Relative %: 44 %
Platelet Count: 198 K/uL (ref 150–400)
RBC: 3.96 MIL/uL (ref 3.87–5.11)
RDW: 14 % (ref 11.5–15.5)
WBC Count: 8.6 K/uL (ref 4.0–10.5)
nRBC: 0 % (ref 0.0–0.2)

## 2024-06-28 NOTE — Telephone Encounter (Signed)
 Per Dr. Babara ok to get labs done.

## 2024-06-28 NOTE — Telephone Encounter (Signed)
 The patient states that she had ARTHROPLASTY, KNEE, TOTAL 9/15.  She was sometimes having stools that are yellow she said pain in her left side she says below belly and she sometimes low-grade fever tired she went to urgent care last week and that Dr. Glenwood it could be things about leukemia so she was supposed to call us  and see if she can get seen or have labs done.

## 2024-06-29 ENCOUNTER — Telehealth: Payer: Self-pay | Admitting: *Deleted

## 2024-06-29 NOTE — Telephone Encounter (Signed)
 The patient has says that the white blood cells was high for 8 years and she did not need any treatments.  She is having low-grade fevers and  chills tired and still having light brown and sometimes yellow stools since her ARTHROPLASTY, KNEE, TOTAL. She was told that maybe from the CLL and not the surgery. Now the WBC is 8.6 and she says that dr Babara says it is ok with that number of WBC. The patient feels that she would like a second opinion because when the number has dropped she thinks that means that something is going on in her body. She would like to have second opinion with another doctor here in the clinic.  And understands all this information that we talked of on the telephone

## 2024-06-29 NOTE — Telephone Encounter (Signed)
 Pt also sent Mychart message. Reply sent offering her an appt to see MD for further eval / discussion.

## 2024-06-30 NOTE — Telephone Encounter (Signed)
 Pt will be seen on 10/16

## 2024-07-01 ENCOUNTER — Inpatient Hospital Stay

## 2024-07-01 ENCOUNTER — Inpatient Hospital Stay (HOSPITAL_BASED_OUTPATIENT_CLINIC_OR_DEPARTMENT_OTHER): Admitting: Oncology

## 2024-07-01 ENCOUNTER — Encounter: Payer: Self-pay | Admitting: Oncology

## 2024-07-01 VITALS — BP 113/72 | HR 85 | Temp 97.5°F | Resp 18 | Wt 197.2 lb

## 2024-07-01 DIAGNOSIS — M7989 Other specified soft tissue disorders: Secondary | ICD-10-CM

## 2024-07-01 DIAGNOSIS — C9110 Chronic lymphocytic leukemia of B-cell type not having achieved remission: Secondary | ICD-10-CM | POA: Diagnosis not present

## 2024-07-01 DIAGNOSIS — R509 Fever, unspecified: Secondary | ICD-10-CM

## 2024-07-01 LAB — CBC WITH DIFFERENTIAL (CANCER CENTER ONLY)
Abs Immature Granulocytes: 0.03 K/uL (ref 0.00–0.07)
Basophils Absolute: 0 K/uL (ref 0.0–0.1)
Basophils Relative: 0 %
Eosinophils Absolute: 0.2 K/uL (ref 0.0–0.5)
Eosinophils Relative: 2 %
HCT: 41.6 % (ref 36.0–46.0)
Hemoglobin: 13.1 g/dL (ref 12.0–15.0)
Immature Granulocytes: 0 %
Lymphocytes Relative: 41 %
Lymphs Abs: 4 K/uL (ref 0.7–4.0)
MCH: 29.8 pg (ref 26.0–34.0)
MCHC: 31.5 g/dL (ref 30.0–36.0)
MCV: 94.8 fL (ref 80.0–100.0)
Monocytes Absolute: 0.5 K/uL (ref 0.1–1.0)
Monocytes Relative: 5 %
Neutro Abs: 4.9 K/uL (ref 1.7–7.7)
Neutrophils Relative %: 52 %
Platelet Count: 188 K/uL (ref 150–400)
RBC: 4.39 MIL/uL (ref 3.87–5.11)
RDW: 13.9 % (ref 11.5–15.5)
WBC Count: 9.6 K/uL (ref 4.0–10.5)
nRBC: 0 % (ref 0.0–0.2)

## 2024-07-01 LAB — CMP (CANCER CENTER ONLY)
ALT: 19 U/L (ref 0–44)
AST: 23 U/L (ref 15–41)
Albumin: 4.8 g/dL (ref 3.5–5.0)
Alkaline Phosphatase: 83 U/L (ref 38–126)
Anion gap: 12 (ref 5–15)
BUN: 17 mg/dL (ref 8–23)
CO2: 26 mmol/L (ref 22–32)
Calcium: 9.9 mg/dL (ref 8.9–10.3)
Chloride: 101 mmol/L (ref 98–111)
Creatinine: 0.75 mg/dL (ref 0.44–1.00)
GFR, Estimated: >60 mL/min (ref >60)
Glucose, Bld: 101 mg/dL — ABNORMAL HIGH (ref 70–99)
Potassium: 3.7 mmol/L (ref 3.5–5.1)
Sodium: 139 mmol/L (ref 135–145)
Total Bilirubin: 0.5 mg/dL (ref 0.0–1.2)
Total Protein: 8.1 g/dL (ref 6.5–8.1)

## 2024-07-01 NOTE — Progress Notes (Signed)
 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 Her WBC baseline is about 15.  Currently decreased to be within normal limits. CLL has been on watchful waiting. I am concerned that white count decrease, in the context of fever and chills symptoms, is secondary to recent surgery, possible underlying infection.  Pending additional workup.  Low grade fever Low-grade fever and chills.  Check blood culture.-Only 1 set was obtained due to lack of good IV access  Swelling of left lower extremity Obtain ultrasound left lower extremity venous evaluation   Orders Placed This Encounter  Procedures   Culture, blood (routine x 2)    Standing Status:   Future    Number of Occurrences:   1    Expected Date:   07/01/2024    Expiration Date:   09/29/2024   US  Venous Img Lower Unilateral Left (DVT)    Standing Status:   Future    Expected Date:   07/01/2024    Expiration Date:   07/01/2025    Reason for Exam (SYMPTOM  OR DIAGNOSIS REQUIRED):   Leg swelling    Preferred imaging location?:   [facility]   CBC with Differential (Cancer Center Only)    Standing Status:   Future    Number of Occurrences:   1    Expected Date:   07/01/2024    Expiration Date:   09/29/2024   CMP (Cancer Center only)    Standing Status:   Future     Number of Occurrences:   1    Expected Date:   07/01/2024    Expiration Date:   09/29/2024   Keep currently scheduled appointment. All questions were answered. The patient knows to call the clinic with any problems, questions or concerns.  Call Babara, MD, PhD [facility] Hematology Oncology 07/01/2024     HISTORY OF PRESENTING ILLNESS:  Natalie Eaton 68 y.o. female presents for follow-up of CLL Patient previously followed up with Dr.Agrawal. Patient switched care to me on  Extensive medical record review was performed by me on 02/24/2024  Oncology History  CLL (chronic lymphocytic leukemia) (HCC)  10/03/2022 Initial Diagnosis   CLL (chronic lymphocytic leukemia) (HCC)   10/03/2022 Cancer Staging   Staging form: Chronic Lymphocytic Leukemia / Small Lymphocytic Lymphoma, AJCC 8th Edition - Clinical stage from 10/03/2022: Modified Rai Stage 0 (Modified Rai risk: Low, Lymphocytosis: Present, Adenopathy: Absent, Organomegaly: Absent, Anemia: Absent, Thrombocytopenia: Absent) - Signed by Agrawal, Kavita, MD on 10/03/2022 Stage prefix: Initial diagnosis   10/13/2023 Imaging   CT chest abdomen pelvis with contrast showed  No developing lymph node enlargement or splenomegaly.   Stable pneumatocele with some distortion and nodularity the right lung apex.Small hiatal hernia.  Colonic diverticula.Previous cholecystectomy with stable ectasia of the extrahepatic common duct extending to the ampulla with slightly abrupt transition but appearance is essentially unchanged from previous exam. Attention on follow-up.    Today patient presents for acute visit for complaints  of low-grade fever, chills. Discussed the use of AI scribe software for clinical note transcription with the patient, who gave verbal consent to proceed.   She has been experiencing a low-grade fever for about a month following her knee surgery on May 31, 2024. Initially, her surgeon indicated that a post-surgical fever would  typically last only a week, but her fever has persisted. She also experiences chills, alternating between feeling cold and sweating.  There is a significant decrease in appetite, with her eating only one meal a day since the surgery. She feels tired and generally unwell. Swelling in her leg is noted, which she manages by elevating it every night. She was not prescribed any preventive medications like Eliquis or Lovenox post-surgery and is currently only taking aspirin after her blood thinner prescription ran out after a month.  Patient has CLL.  She has not experienced weight loss or low-grade fever prior to the surgery.  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, had lobectomy,   still has stable cyst in right lung   Pneumonia    Pre-diabetes     SURGICAL HISTORY: Past Surgical History:  Procedure Laterality Date   ABDOMINAL HYSTERECTOMY     BLADDER SURGERY     CARDIAC CATHETERIZATION     done in [place]  ~ 8-10 yrs ago nonobstructive CAD per dictation   GALLBLADDER SURGERY     LUNG REMOVAL, PARTIAL Right 1960   for pneumatocele   ROTATOR CUFF REPAIR Right    TONSILLECTOMY     TOTAL KNEE ARTHROPLASTY Left 05/31/2024   Procedure: ARTHROPLASTY, KNEE, TOTAL;  Surgeon: Melodi Lerner, MD;  Location: [facility];  Service: Orthopedics;  Laterality: Left;   TUBAL LIGATION      SOCIAL HISTORY: Social History   Socioeconomic History   Marital status: Widowed    Spouse name: Not on file   Number  of children: 2   Years of education: Not on file   Highest education level: Associate degree: occupational, Scientist, product/process development, or vocational program  Occupational History   Not on file  Tobacco Use   Smoking status: Former    Current packs/day: 0.00    Average packs/day: 0.3 packs/day for 15.0 years (3.8 ttl pk-yrs)    Types: Cigarettes    Start date: 71    Quit date: 50    Years since quitting: 29.8   Smokeless tobacco: Never  Vaping Use   Vaping status: Never Used  Substance and Sexual Activity   Alcohol use: Yes    Alcohol/week: 1.0 standard drink of alcohol    Types: 1 Glasses of wine per week   Drug use: No   Sexual activity: Not Currently    Birth control/protection: None  Other Topics Concern   Not on file  Social History Narrative   Not on file  Social Drivers of Health   Financial Resource Strain: Medium Risk (11/03/2023)   Overall Financial Resource Strain (CARDIA)    Difficulty of Paying Living Expenses: Somewhat hard  Food Insecurity: No Food Insecurity (05/31/2024)   Hunger Vital Sign    Worried About Running Out of Food in the Last Year: Never true    Ran Out of Food in the Last Year: Never true  Transportation Needs: No Transportation Needs (05/31/2024)   PRAPARE - Administrator, Civil Service (Medical): No    Lack of Transportation (Non-Medical): No  Physical Activity: Inactive (11/03/2023)   Exercise Vital Sign    Days of Exercise per Week: 0 days    Minutes of Exercise per Session: 30 min  Stress: No Stress Concern Present (11/03/2023)   [employer] of Occupational Health - Occupational Stress Questionnaire    Feeling of Stress : Not at all  Social Connections: Unknown (05/31/2024)   Social Connection and Isolation Panel    Frequency of Communication with Friends and Family: Twice a week    Frequency of Social Gatherings with Friends and Family: Patient declined    Attends Religious Services: More than 4 times per year    Active Member  of [employer] or Organizations: Yes    Attends Banker Meetings: More than 4 times per year    Marital Status: Widowed  Intimate Partner Violence: Not At Risk (05/31/2024)   Humiliation, Afraid, Rape, and Kick questionnaire    Fear of Current or Ex-Partner: No    Emotionally Abused: No    Physically Abused: No    Sexually Abused: No    FAMILY HISTORY: Family History  Problem Relation Age of Onset   Scoliosis Mother    Heart disease Father        Died   Heart disease Brother    Healthy Daughter    Healthy Son     ALLERGIES:  is allergic to codeine.  MEDICATIONS:  Current Outpatient Medications  Medication Sig Dispense Refill   gabapentin  (NEURONTIN ) 300 MG capsule TAKE 1 CAPSULE BY MOUTH THREE TIMES A DAY 270 capsule 1   hydrochlorothiazide  (HYDRODIURIL ) 25 MG tablet Take 1 tablet (25 mg total) by mouth daily. 30 tablet 11   levothyroxine  (SYNTHROID ) 75 MCG tablet TAKE 1 TABLET BY MOUTH EVERY DAY 90 tablet 1   Menaquinone-7 (VITAMIN K2) 100 MCG CAPS Take 1 tablet by mouth daily at 6 (six) AM.     oxyCODONE (OXY IR/ROXICODONE) 5 MG immediate release tablet 1-2 tablets po q8 hours prn severe pain     Oxycodone HCl 10 MG TABS Take 1 tablet 3 times a day by oral route as needed for 15 days.     potassium chloride  (MICRO-K ) 10 MEQ CR capsule TAKE 1 CAPSULE BY MOUTH 2 TIMES DAILY. 180 capsule 1   rosuvastatin  (CRESTOR ) 10 MG tablet Take 1 tablet (10 mg total) by mouth daily. 90 tablet 3   traMADol  (ULTRAM ) 50 MG tablet Take 1-2 tablets (50-100 mg total) by mouth every 6 (six) hours as needed for moderate pain (pain score 4-6). 40 tablet 0   traZODone  (DESYREL ) 100 MG tablet Take 1 tablet (100 mg total) by mouth at bedtime. (Patient taking differently: Take 100 mg by mouth at bedtime as needed for sleep.) 30 tablet 1   vitamin E 200 UNIT capsule Take 200 Units by mouth daily.     cephALEXin (KEFLEX) 500 MG capsule Take 500 mg by mouth 3 (three) times daily. (Patient  not taking:  Reported on 07/01/2024)     No current facility-administered medications for this visit.     Review of Systems  Constitutional:  Positive for chills, fatigue and fever. Negative for appetite change.  HENT:   Negative for hearing loss and voice change.   Eyes:  Negative for eye problems.  Respiratory:  Negative for chest tightness and cough.   Cardiovascular:  Positive for leg swelling. Negative for chest pain.  Gastrointestinal:  Negative for abdominal distention, abdominal pain, blood in stool, nausea and vomiting.  Endocrine: Negative for hot flashes.  Genitourinary:  Negative for difficulty urinating and frequency.   Musculoskeletal:  Negative for arthralgias.  Skin:  Negative for itching and rash.  Neurological:  Negative for extremity weakness.  Hematological:  Negative for adenopathy.  Psychiatric/Behavioral:  Negative for confusion.      PHYSICAL EXAMINATION: ECOG PERFORMANCE STATUS: 1 - Symptomatic but completely ambulatory  Vitals:   07/01/24 1530  BP: 113/72  Pulse: 85  Resp: 18  Temp: (!) 97.5 F (36.4 C)  SpO2: 100%    Filed Weights   07/01/24 1530  Weight: 197 lb 3.2 oz (89.4 kg)     Physical Exam Constitutional:      General: She is not in acute distress.    Appearance: She is not diaphoretic.  HENT:     Head: Normocephalic and atraumatic.  Eyes:     General: No scleral icterus. Cardiovascular:     Rate and Rhythm: Normal rate and regular rhythm.     Heart sounds: No murmur heard. Pulmonary:     Effort: Pulmonary effort is normal. No respiratory distress.     Breath sounds: No wheezing.  Abdominal:     General: There is no distension.     Palpations: Abdomen is soft.     Tenderness: There is no abdominal tenderness.  Musculoskeletal:        General: Normal range of motion.     Cervical back: Normal range of motion and neck supple.     Comments: Status post left knee replacement.  Left lower extremity swelling.  Skin:    General: Skin is  warm and dry.     Findings: No erythema.  Neurological:     Mental Status: She is alert and oriented to person, place, and time. Mental status is at baseline.     Cranial Nerves: No cranial nerve deficit.     Motor: No abnormal muscle tone.  Psychiatric:        Mood and Affect: Mood and affect normal.      LABORATORY DATA:  I have reviewed the data as listed Lab Results  Component Value Date   WBC 9.6 07/01/2024   HGB 13.1 07/01/2024   HCT 41.6 07/01/2024   MCV 94.8 07/01/2024   PLT 188 07/01/2024   Recent Labs    02/24/24 1327 05/25/24 1018 06/01/24 0336 07/01/24 1629  NA 140 139 139 139  K 3.1* 3.5 4.8 3.7  CL 104 99 105 101  CO2 26 28 23 26   GLUCOSE 105* 112* 134* 101*  BUN 28* 15 11 17   CREATININE 1.09* 0.76 0.76 0.75  CALCIUM  9.1 10.7* 9.6 9.9  GFRNONAA 55* >60 >60 >60  PROT 7.1 7.1  --  8.1  ALBUMIN 4.2 4.7  --  4.8  AST 20 22  --  23  ALT 19 22  --  19  ALKPHOS 59 73  --  83  BILITOT 0.4 0.4  --  0.5  RADIOGRAPHIC STUDIES: I have personally reviewed the radiological images as listed and agreed with the findings in the report. No results found.

## 2024-07-01 NOTE — Assessment & Plan Note (Addendum)
 Low-grade fever and chills.  Check blood culture.-Only 1 set was obtained due to lack of good IV access

## 2024-07-01 NOTE — Assessment & Plan Note (Signed)
 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 Her WBC baseline is about 15.  Currently decreased to be within normal limits. CLL has been on watchful waiting. I am concerned that white count decrease, in the context of fever and chills symptoms, is secondary to recent surgery, possible underlying infection.  Pending additional workup.

## 2024-07-01 NOTE — Assessment & Plan Note (Signed)
 Obtain ultrasound left lower extremity venous evaluation

## 2024-07-02 ENCOUNTER — Telehealth: Payer: Self-pay

## 2024-07-02 NOTE — Telephone Encounter (Signed)
 Pt called asking for lab results from yesterday.   Call returned to pt and reviewed cbc, cmp results, which are stable. Informed her that blood cultures are still pending. Pt aware that she may get a call until Monday.

## 2024-07-02 NOTE — Telephone Encounter (Signed)
 Per Ronni with US  dept, pt declined to schedule US  Lower exremity. She will wait to see her ortho doc first.

## 2024-07-05 ENCOUNTER — Telehealth: Payer: Self-pay | Admitting: *Deleted

## 2024-07-05 NOTE — Telephone Encounter (Signed)
 I called the patient and for the results and it says it has been seen for 4 days and nothing but the  the culture has not been released  so probably tom.

## 2024-07-06 LAB — CULTURE, BLOOD (ROUTINE X 2)
Culture: NO GROWTH
Special Requests: ADEQUATE

## 2024-07-19 ENCOUNTER — Ambulatory Visit: Payer: Self-pay

## 2024-07-19 NOTE — Telephone Encounter (Signed)
 FYI Only or Action Required?: FYI only for provider: Patient says she will go to urgent care.  Patient was last seen in primary care on 02/16/2024 by Cyndi Shaver, PA-C.  Called Nurse Triage reporting Flank Pain and Hematuria.  Symptoms began today.  Interventions attempted: Nothing.  Symptoms are: unchanged.  Triage Disposition: See Physician Within 24 Hours  Patient/caregiver understands and will follow disposition?: Yes Copied from CRM #[id number]. Topic: Clinical - Red Word Triage >> Jul 19, 2024 12:04 PM Geneva B wrote: Red Word that prompted transfer to Nurse Triage: patient is having back pain and is peeing blood Reason for Disposition  Side (flank) or back pain present  Answer Assessment - Initial Assessment Questions 1. COLOR of URINE: Describe the color of the urine.  (e.g., tea-colored, pink, red, bloody) Do you have blood clots in your urine? (e.g., none, pea, grape, small coin)     Bright red blood when urinating.  2. ONSET: When did the bleeding start?      Started this morning, approx 30 min ago  3. EPISODES: How many times has there been blood in the urine? or How many times today?     1 episode  4. PAIN with URINATION: Is there any pain with passing your urine? If Yes, ask: How bad is the pain?  (Scale 1-10; or mild, moderate, severe)     Mild pain when urination  5. FEVER: Do you have a fever? If Yes, ask: What is your temperature, how was it measured, and when did it start?     No  6. ASSOCIATED SYMPTOMS: Are you passing urine more frequently than usual?     Urinary urgency   7. OTHER SYMPTOMS: Do you have any other symptoms? (e.g., back/flank pain, abdomen pain, vomiting)     Back pain  8. PREGNANCY: Is there any chance you are pregnant? When was your last menstrual period?     No  Protocols used: Urine - Blood In-A-AH

## 2024-07-22 ENCOUNTER — Encounter: Payer: Self-pay | Admitting: Family Medicine

## 2024-07-22 ENCOUNTER — Ambulatory Visit: Admitting: Family Medicine

## 2024-07-22 VITALS — BP 125/67 | HR 70 | Temp 98.3°F | Ht 67.0 in | Wt 196.0 lb

## 2024-07-22 DIAGNOSIS — Z789 Other specified health status: Secondary | ICD-10-CM

## 2024-07-22 DIAGNOSIS — R7303 Prediabetes: Secondary | ICD-10-CM | POA: Diagnosis not present

## 2024-07-22 DIAGNOSIS — E7800 Pure hypercholesterolemia, unspecified: Secondary | ICD-10-CM | POA: Diagnosis not present

## 2024-07-22 DIAGNOSIS — M85852 Other specified disorders of bone density and structure, left thigh: Secondary | ICD-10-CM

## 2024-07-22 DIAGNOSIS — E039 Hypothyroidism, unspecified: Secondary | ICD-10-CM | POA: Diagnosis not present

## 2024-07-22 DIAGNOSIS — I10 Essential (primary) hypertension: Secondary | ICD-10-CM | POA: Diagnosis not present

## 2024-07-22 DIAGNOSIS — J449 Chronic obstructive pulmonary disease, unspecified: Secondary | ICD-10-CM

## 2024-07-22 MED ORDER — HYDROCHLOROTHIAZIDE 25 MG PO TABS: 25.0000 mg | ORAL_TABLET | Freq: Every day | ORAL | 1 refills | Status: AC

## 2024-07-22 MED ORDER — LEVOTHYROXINE SODIUM 75 MCG PO TABS: 75.0000 ug | ORAL_TABLET | Freq: Every day | ORAL | 1 refills | Status: AC

## 2024-07-22 NOTE — Assessment & Plan Note (Signed)
 Former smoker. Pulmonary function testing in May 2024 showed minimal obstructive airway disease, asthmatic type.  Patient remains asymptomatic without daily respiratory symptoms.  - Patient reports only one use of albuterol  since previous prescription.

## 2024-07-22 NOTE — Assessment & Plan Note (Addendum)
 Chronic, stable.  Fatigue possibly related to hypothyroidism. Thyroid medication refill needed. - Ordered thyroid level . - Refilled levothyroxine  prescription.

## 2024-07-22 NOTE — Progress Notes (Signed)
 Established patient visit   Patient: Natalie Eaton   DOB: Jun 13, 1956   68 y.o. Female  MRN: [id number] Visit Date: 07/22/2024  Today's healthcare provider: LAURAINE LOISE BUOY, DO   Chief Complaint  Patient presents with   Transitions Of Care    Patient is here today to transfer her care with a primary provider.  Vaccines declined    Subjective    HPI Natalie Eaton is a 68 year old female who presents for medication refills and follow-up.  She underwent a total knee replacement on May 31, 2024, and completed physical therapy last week. Recovery is progressing well, though she experiences some stiffness and swelling in the knee, with residual warmth. She describes the surgery as painful and wishes to avoid similar procedures in the future.  She is currently taking gabapentin , which was last refilled in August, and is due for a new prescription. She also takes Celebrex , primarily once a day, although it was initially prescribed twice daily. Celebrex  was discontinued before her surgery due to its blood-thinning properties. She is out of her thyroid medication, levothyroxine , and needs a refill. She takes trazodone  as needed and has enough rosuvastatin , which was recently refilled.  She has a history of osteoporosis and leukemia, which have influenced her treatment options. A neurosurgeon advised against back surgery due to these conditions. 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  She has a history of prediabetes, with an elevated A1c since 2019. She frequently feels tired and acknowledges rising glucose levels. She is considering dietary changes and increasing physical activity to manage her condition. She mentions a previous fasting diet that helped her lose weight before her surgery.  She has an albuterol  inhaler but rarely uses it, as she does not experience wheezing or shortness of breath. Her oxygen levels are at 100%, and she has only used the  inhaler once after a previous consultation.  She reports a cyst in her right lung that was partially removed and is not currently growing. She also mentions a past incident where a needle broke in her neck at age 89, requiring surgical removal, which may have left scar tissue.       Medications: Outpatient Medications Prior to Visit  Medication Sig   Boswellia-Glucosamine-Vit D (OSTEO BI-FLEX ONE PER DAY PO) Take by mouth.   celecoxib  (CELEBREX ) 200 MG capsule Take 200 mg by mouth 2 (two) times daily.   ciprofloxacin (CIPRO) 750 MG tablet Take 750 mg by mouth 2 (two) times daily.   gabapentin  (NEURONTIN ) 300 MG capsule TAKE 1 CAPSULE BY MOUTH THREE TIMES A DAY   Menaquinone-7 (VITAMIN K2) 100 MCG CAPS Take 1 tablet by mouth daily at 6 (six) AM.   methocarbamol  (ROBAXIN ) 750 MG tablet Take 750 mg by mouth 4 (four) times daily as needed for muscle spasms.   Multiple Vitamin (MULTIVITAMIN) tablet Take 1 tablet by mouth daily.   mupirocin ointment (BACTROBAN) 2 % Apply 1 Application topically 3 (three) times daily.   Oxycodone HCl 10 MG TABS Take 1 tablet 3 times a day by oral route as needed for 15 days.   potassium chloride  (MICRO-K ) 10 MEQ CR capsule TAKE 1 CAPSULE BY MOUTH 2 TIMES DAILY.   rosuvastatin  (CRESTOR ) 10 MG tablet Take 1 tablet (10 mg total) by mouth daily.   traMADol  (ULTRAM ) 50 MG tablet Take 1-2 tablets (50-100 mg total) by mouth every 6 (six) hours as needed for moderate pain (pain score 4-6).  traZODone  (DESYREL ) 100 MG tablet Take 1 tablet (100 mg total) by mouth at bedtime. (Patient taking differently: Take 100 mg by mouth at bedtime as needed for sleep.)   vitamin E 200 UNIT capsule Take 200 Units by mouth daily.   [DISCONTINUED] hydrochlorothiazide  (HYDRODIURIL ) 25 MG tablet Take 1 tablet (25 mg total) by mouth daily.   [DISCONTINUED] levothyroxine  (SYNTHROID ) 75 MCG tablet TAKE 1 TABLET BY MOUTH EVERY DAY   [DISCONTINUED] cephALEXin (KEFLEX) 500 MG capsule Take 500  mg by mouth 3 (three) times daily. (Patient not taking: Reported on 07/01/2024)   [DISCONTINUED] oxyCODONE (OXY IR/ROXICODONE) 5 MG immediate release tablet 1-2 tablets po q8 hours prn severe pain   No facility-administered medications prior to visit.        Objective    BP 125/67 (BP Location: Right Arm, Patient Position: Sitting, Cuff Size: Normal)   Pulse 70   Temp 98.3 F (36.8 C) (Oral)   Ht 5' 7 (1.702 m)   Wt 196 lb (88.9 kg)   SpO2 100%   BMI 30.70 kg/m     Physical Exam Constitutional:      Appearance: Normal appearance.  HENT:     Head: Normocephalic and atraumatic.  Eyes:     General: No scleral icterus.    Extraocular Movements: Extraocular movements intact.     Conjunctiva/sclera: Conjunctivae normal.  Cardiovascular:     Rate and Rhythm: Normal rate and regular rhythm.     Pulses: Normal pulses.     Heart sounds: Normal heart sounds.  Pulmonary:     Effort: Pulmonary effort is normal. No respiratory distress.     Breath sounds: Normal breath sounds.  Musculoskeletal:     Right lower leg: No edema.     Left lower leg: No edema.  Skin:    General: Skin is warm and dry.  Neurological:     Mental Status: She is alert and oriented to person, place, and time. Mental status is at baseline.  Psychiatric:        Mood and Affect: Mood normal.        Behavior: Behavior normal.      Results for orders placed or performed in visit on 07/22/24  Lipid panel  Result Value Ref Range   Cholesterol, Total 142 100 - 199 mg/dL   Triglycerides 843 (H) 0 - 149 mg/dL   HDL 41 >60 mg/dL   VLDL Cholesterol Cal 27 5 - 40 mg/dL   LDL Chol Calc (NIH) 74 0 - 99 mg/dL   Chol/HDL Ratio 3.5 0.0 - 4.4 ratio  TSH Rfx on Abnormal to Free T4  Result Value Ref Range   TSH 1.330 0.450 - 4.500 uIU/mL    Assessment & Plan    Hypothyroidism, unspecified type Assessment & Plan: Chronic, stable.  Fatigue possibly related to hypothyroidism. Thyroid medication refill needed. -  Ordered thyroid level . - Refilled levothyroxine  prescription.  Orders: -     Levothyroxine  Sodium; Take 1 tablet (75 mcg total) by mouth daily.  Dispense: 90 tablet; Refill: 1 -     TSH Rfx on Abnormal to Free T4 -     TSH Rfx on Abnormal to Free T4  Essential hypertension, benign Assessment & Plan: Chronic, stable.  Well-managed on hydrochlorothiazide  25 mg daily.  Refill hydrochlorothiazide  today.  No changes today.  Orders: -     hydroCHLOROthiazide ; Take 1 tablet (25 mg total) by mouth daily.  Dispense: 90 tablet; Refill: 1 -     TSH  Rfx on Abnormal to Free T4  Prediabetes Assessment & Plan: Elevated A1c indicating prediabetes since 2019. Discussed lifestyle modifications and potential medication options. She prefers lifestyle modifications. - Encouraged dietary modifications and increased physical activity. - Will consider metformin if lifestyle modifications are insufficient.    Pure hypercholesterolemia Assessment & Plan: Cholesterol management ongoing with rosuvastatin . Recent refill confirmed. - Ordered cholesterol panel.   Orders: -     Lipid panel  Statin intolerance Assessment & Plan: Noted.  Patient able to tolerate rosuvastatin  10 mg daily.   Obstructive airway disease (HCC) Assessment & Plan: Former smoker. Pulmonary function testing in May 2024 showed minimal obstructive airway disease, asthmatic type.  Patient remains asymptomatic without daily respiratory symptoms.  - Patient reports only one use of albuterol  since previous prescription.     Osteopenia of neck of left femur      Right knee, status post total knee replacement Recovery progressing well with some stiffness and swelling. Physical therapy completed. Follow-up with orthopedic surgeon scheduled for December 2nd or 5th. - Continue follow-up with orthopedic surgeon.  Defer to specialist management.  Chronic back pain and osteopenia Chronic back pain exacerbated by recent knee surgery.   Osteopenia noted on imaging. Spinal cord stimulator planned due to osteoporosis and leukemia history. Follow-up with neurosurgeon scheduled for February if not completed before Christmas. - Continue with spinal cord stimulator plan. - Follow up with neurosurgeon in February if not completed before Christmas.  Defer to specialist management.  General Health Maintenance Discussed general health maintenance including exercise and diet. She plans to resume fasting diet and increase physical activity at [employer]. - Encouraged regular exercise and dietary modifications.    Return in about 6 months (around 01/19/2025) for Chronic f/u.      I discussed the assessment and treatment plan with the patient  The patient was provided an opportunity to ask questions and all were answered. The patient agreed with the plan and demonstrated an understanding of the instructions.   The patient was advised to call back or seek an in-person evaluation if the symptoms worsen or if the condition fails to improve as anticipated.    LAURAINE LOISE BUOY, DO  [facility] [phone] (phone) [phone] (fax)  [facility]

## 2024-07-23 LAB — LIPID PANEL
Chol/HDL Ratio: 3.5 ratio (ref 0.0–4.4)
Cholesterol, Total: 142 mg/dL (ref 100–199)
HDL: 41 mg/dL (ref >39)
LDL Chol Calc (NIH): 74 mg/dL (ref 0–99)
Triglycerides: 156 mg/dL — ABNORMAL HIGH (ref 0–149)
VLDL Cholesterol Cal: 27 mg/dL (ref 5–40)

## 2024-07-23 LAB — TSH RFX ON ABNORMAL TO FREE T4: TSH: 1.33 u[IU]/mL (ref 0.450–4.500)

## 2024-07-31 ENCOUNTER — Ambulatory Visit: Payer: Self-pay | Admitting: Family Medicine

## 2024-08-02 ENCOUNTER — Encounter: Payer: Self-pay | Admitting: Family Medicine

## 2024-08-02 DIAGNOSIS — M85852 Other specified disorders of bone density and structure, left thigh: Secondary | ICD-10-CM | POA: Insufficient documentation

## 2024-08-02 NOTE — Assessment & Plan Note (Signed)
 Chronic, stable.  Well-managed on hydrochlorothiazide  25 mg daily.  Refill hydrochlorothiazide  today.  No changes today.

## 2024-08-02 NOTE — Assessment & Plan Note (Signed)
 Cholesterol management ongoing with rosuvastatin . Recent refill confirmed. - Ordered cholesterol panel.

## 2024-08-02 NOTE — Assessment & Plan Note (Signed)
 Noted.  Patient able to tolerate rosuvastatin  10 mg daily.

## 2024-08-02 NOTE — Assessment & Plan Note (Signed)
 Elevated A1c indicating prediabetes since 2019. Discussed lifestyle modifications and potential medication options. She prefers lifestyle modifications. - Encouraged dietary modifications and increased physical activity. - Will consider metformin if lifestyle modifications are insufficient.

## 2024-08-23 ENCOUNTER — Telehealth: Payer: Self-pay | Admitting: Oncology

## 2024-08-23 NOTE — Telephone Encounter (Signed)
 Pt called to r/s appt for next week and asked to push it back to April - asked pt if she wanted to meet in the middle in Feb, but she said she was set on the next appt in April. Pt will be having back surgery between now and April and wants to get that done and give her body time to heal - [facility]

## 2024-08-25 ENCOUNTER — Ambulatory Visit: Admitting: Oncology

## 2024-08-25 ENCOUNTER — Other Ambulatory Visit

## 2024-08-29 ENCOUNTER — Encounter: Payer: Self-pay | Admitting: Family Medicine

## 2024-08-30 ENCOUNTER — Other Ambulatory Visit: Payer: Self-pay

## 2024-08-30 DIAGNOSIS — E7800 Pure hypercholesterolemia, unspecified: Secondary | ICD-10-CM

## 2024-08-30 MED ORDER — ROSUVASTATIN CALCIUM 10 MG PO TABS: 10.0000 mg | ORAL_TABLET | Freq: Every day | ORAL | 3 refills | Status: AC

## 2024-12-20 ENCOUNTER — Inpatient Hospital Stay: Admitting: Oncology

## 2024-12-20 ENCOUNTER — Inpatient Hospital Stay

## 2025-01-19 ENCOUNTER — Encounter

## 2025-03-29 ENCOUNTER — Ambulatory Visit
